# Patient Record
Sex: Female | Born: 1935 | Race: White | Hispanic: No | Smoking: Never smoker
Health system: Southern US, Community
[De-identification: ages and names within clinical notes are randomized; demographics above are authoritative.]

## PROBLEM LIST (undated history)

## (undated) DIAGNOSIS — N649 Disorder of breast, unspecified: Secondary | ICD-10-CM

## (undated) DIAGNOSIS — I10 Essential (primary) hypertension: Secondary | ICD-10-CM

## (undated) DIAGNOSIS — Z800 Family history of malignant neoplasm of digestive organs: Secondary | ICD-10-CM

## (undated) DIAGNOSIS — E7800 Pure hypercholesterolemia, unspecified: Secondary | ICD-10-CM

## (undated) DIAGNOSIS — Z853 Personal history of malignant neoplasm of breast: Secondary | ICD-10-CM

## (undated) DIAGNOSIS — E119 Type 2 diabetes mellitus without complications: Secondary | ICD-10-CM

## (undated) DIAGNOSIS — C50912 Malignant neoplasm of unspecified site of left female breast: Secondary | ICD-10-CM

## (undated) DIAGNOSIS — Z801 Family history of malignant neoplasm of trachea, bronchus and lung: Secondary | ICD-10-CM

## (undated) DIAGNOSIS — Z803 Family history of malignant neoplasm of breast: Secondary | ICD-10-CM

## (undated) HISTORY — DX: Disorder of breast, unspecified: N64.9

## (undated) HISTORY — DX: Pure hypercholesterolemia, unspecified: E78.00

## (undated) HISTORY — PX: MASTECTOMY: SHX3

## (undated) HISTORY — DX: Family history of malignant neoplasm of digestive organs: Z80.0

## (undated) HISTORY — PX: BACK SURGERY: SHX140

## (undated) HISTORY — DX: Family history of malignant neoplasm of trachea, bronchus and lung: Z80.1

## (undated) HISTORY — DX: Family history of malignant neoplasm of breast: Z80.3

## (undated) HISTORY — DX: Essential (primary) hypertension: I10

## (undated) HISTORY — DX: Malignant neoplasm of unspecified site of left female breast: C50.912

## (undated) HISTORY — DX: Personal history of malignant neoplasm of breast: Z85.3

## (undated) HISTORY — DX: Type 2 diabetes mellitus without complications: E11.9

---

## 2001-04-27 ENCOUNTER — Ambulatory Visit (HOSPITAL_COMMUNITY): Admission: RE | Admit: 2001-04-27 | Discharge: 2001-04-27 | Payer: Self-pay | Admitting: Internal Medicine

## 2001-04-27 ENCOUNTER — Encounter: Payer: Self-pay | Admitting: Internal Medicine

## 2001-05-24 ENCOUNTER — Other Ambulatory Visit: Admission: RE | Admit: 2001-05-24 | Discharge: 2001-05-24 | Payer: Self-pay | Admitting: Internal Medicine

## 2002-04-30 ENCOUNTER — Ambulatory Visit (HOSPITAL_COMMUNITY): Admission: RE | Admit: 2002-04-30 | Discharge: 2002-04-30 | Payer: Self-pay | Admitting: Internal Medicine

## 2002-04-30 ENCOUNTER — Encounter: Payer: Self-pay | Admitting: Internal Medicine

## 2003-05-05 ENCOUNTER — Ambulatory Visit (HOSPITAL_COMMUNITY): Admission: RE | Admit: 2003-05-05 | Discharge: 2003-05-05 | Payer: Self-pay | Admitting: Internal Medicine

## 2004-05-24 ENCOUNTER — Ambulatory Visit (HOSPITAL_COMMUNITY): Admission: RE | Admit: 2004-05-24 | Discharge: 2004-05-24 | Payer: Self-pay | Admitting: Internal Medicine

## 2004-06-01 ENCOUNTER — Ambulatory Visit (HOSPITAL_COMMUNITY): Admission: RE | Admit: 2004-06-01 | Discharge: 2004-06-01 | Payer: Self-pay | Admitting: Internal Medicine

## 2004-06-16 ENCOUNTER — Ambulatory Visit (HOSPITAL_COMMUNITY): Admission: RE | Admit: 2004-06-16 | Discharge: 2004-06-16 | Payer: Self-pay | Admitting: Internal Medicine

## 2004-07-07 ENCOUNTER — Ambulatory Visit: Payer: Self-pay | Admitting: Internal Medicine

## 2004-07-07 ENCOUNTER — Ambulatory Visit (HOSPITAL_COMMUNITY): Admission: RE | Admit: 2004-07-07 | Discharge: 2004-07-07 | Payer: Self-pay | Admitting: Internal Medicine

## 2004-08-02 ENCOUNTER — Emergency Department (HOSPITAL_COMMUNITY): Admission: EM | Admit: 2004-08-02 | Discharge: 2004-08-02 | Payer: Self-pay | Admitting: Emergency Medicine

## 2005-06-01 ENCOUNTER — Ambulatory Visit (HOSPITAL_COMMUNITY): Admission: RE | Admit: 2005-06-01 | Discharge: 2005-06-01 | Payer: Self-pay | Admitting: Internal Medicine

## 2005-08-16 ENCOUNTER — Ambulatory Visit (HOSPITAL_COMMUNITY): Admission: RE | Admit: 2005-08-16 | Discharge: 2005-08-16 | Payer: Self-pay | Admitting: Internal Medicine

## 2006-06-02 ENCOUNTER — Ambulatory Visit (HOSPITAL_COMMUNITY): Admission: RE | Admit: 2006-06-02 | Discharge: 2006-06-02 | Payer: Self-pay | Admitting: Internal Medicine

## 2007-06-21 ENCOUNTER — Ambulatory Visit (HOSPITAL_COMMUNITY): Admission: RE | Admit: 2007-06-21 | Discharge: 2007-06-21 | Payer: Self-pay | Admitting: Internal Medicine

## 2007-10-09 ENCOUNTER — Ambulatory Visit (HOSPITAL_COMMUNITY): Admission: RE | Admit: 2007-10-09 | Discharge: 2007-10-09 | Payer: Self-pay | Admitting: Internal Medicine

## 2008-07-01 ENCOUNTER — Ambulatory Visit (HOSPITAL_COMMUNITY): Admission: RE | Admit: 2008-07-01 | Discharge: 2008-07-01 | Payer: Self-pay | Admitting: Internal Medicine

## 2008-07-17 ENCOUNTER — Ambulatory Visit (HOSPITAL_COMMUNITY): Admission: RE | Admit: 2008-07-17 | Discharge: 2008-07-17 | Payer: Self-pay | Admitting: Internal Medicine

## 2008-07-30 ENCOUNTER — Ambulatory Visit (HOSPITAL_COMMUNITY): Admission: RE | Admit: 2008-07-30 | Discharge: 2008-07-30 | Payer: Self-pay | Admitting: Internal Medicine

## 2009-01-02 ENCOUNTER — Emergency Department (HOSPITAL_COMMUNITY): Admission: EM | Admit: 2009-01-02 | Discharge: 2009-01-02 | Payer: Self-pay | Admitting: Emergency Medicine

## 2009-02-04 ENCOUNTER — Ambulatory Visit (HOSPITAL_COMMUNITY): Admission: RE | Admit: 2009-02-04 | Discharge: 2009-02-04 | Payer: Self-pay | Admitting: Neurosurgery

## 2009-04-10 ENCOUNTER — Ambulatory Visit (HOSPITAL_COMMUNITY): Admission: RE | Admit: 2009-04-10 | Discharge: 2009-04-10 | Payer: Self-pay | Admitting: Internal Medicine

## 2009-06-17 ENCOUNTER — Inpatient Hospital Stay (HOSPITAL_COMMUNITY): Admission: RE | Admit: 2009-06-17 | Discharge: 2009-06-22 | Payer: Self-pay | Admitting: Neurosurgery

## 2009-06-22 ENCOUNTER — Inpatient Hospital Stay: Admission: AD | Admit: 2009-06-22 | Discharge: 2009-07-10 | Payer: Self-pay | Admitting: Internal Medicine

## 2009-08-11 ENCOUNTER — Ambulatory Visit (HOSPITAL_COMMUNITY): Admission: RE | Admit: 2009-08-11 | Discharge: 2009-08-11 | Payer: Self-pay | Admitting: Internal Medicine

## 2010-07-13 ENCOUNTER — Other Ambulatory Visit (HOSPITAL_COMMUNITY): Payer: Self-pay | Admitting: Internal Medicine

## 2010-07-13 DIAGNOSIS — Z139 Encounter for screening, unspecified: Secondary | ICD-10-CM

## 2010-08-13 ENCOUNTER — Ambulatory Visit (HOSPITAL_COMMUNITY)
Admission: RE | Admit: 2010-08-13 | Discharge: 2010-08-13 | Disposition: A | Discharge status: Home / Self Care | Payer: Medicare Other | Source: Ambulatory Visit | Attending: Internal Medicine | Admitting: Internal Medicine

## 2010-08-13 DIAGNOSIS — Z139 Encounter for screening, unspecified: Secondary | ICD-10-CM

## 2010-08-13 DIAGNOSIS — Z1231 Encounter for screening mammogram for malignant neoplasm of breast: Secondary | ICD-10-CM | POA: Insufficient documentation

## 2010-08-18 ENCOUNTER — Other Ambulatory Visit: Payer: Self-pay | Admitting: Internal Medicine

## 2010-08-18 DIAGNOSIS — R928 Other abnormal and inconclusive findings on diagnostic imaging of breast: Secondary | ICD-10-CM

## 2010-08-25 ENCOUNTER — Ambulatory Visit (HOSPITAL_COMMUNITY)
Admission: RE | Admit: 2010-08-25 | Discharge: 2010-08-25 | Disposition: A | Discharge status: Home / Self Care | Payer: Medicare Other | Source: Ambulatory Visit | Attending: Internal Medicine | Admitting: Internal Medicine

## 2010-08-25 DIAGNOSIS — R928 Other abnormal and inconclusive findings on diagnostic imaging of breast: Secondary | ICD-10-CM

## 2010-08-29 LAB — TYPE AND SCREEN
ABO/RH(D): B POS
Antibody Screen: POSITIVE

## 2010-09-13 LAB — TYPE AND SCREEN
ABO/RH(D): B POS
Antibody Screen: POSITIVE
DAT, IgG: NEGATIVE
Donor AG Type: NEGATIVE
Donor AG Type: NEGATIVE
PT AG Type: NEGATIVE

## 2010-09-13 LAB — BASIC METABOLIC PANEL
BUN: 19 mg/dL (ref 6–23)
CO2: 33 mEq/L — ABNORMAL HIGH (ref 19–32)
Calcium: 10.7 mg/dL — ABNORMAL HIGH (ref 8.4–10.5)
Chloride: 99 mEq/L (ref 96–112)
Creatinine, Ser: 0.72 mg/dL (ref 0.4–1.2)
GFR calc Af Amer: >60 mL/min (ref >60)
GFR calc non Af Amer: >60 mL/min (ref >60)
Glucose, Bld: 123 mg/dL — ABNORMAL HIGH (ref 70–99)
Potassium: 4.3 mEq/L (ref 3.5–5.1)
Sodium: 139 mEq/L (ref 135–145)

## 2010-09-13 LAB — CBC
HCT: 43 % (ref 36.0–46.0)
Hemoglobin: 14.5 g/dL (ref 12.0–15.0)
MCHC: 33.7 g/dL (ref 30.0–36.0)
MCV: 91.8 fL (ref 78.0–100.0)
Platelets: 328 10*3/uL (ref 150–400)
RBC: 4.68 MIL/uL (ref 3.87–5.11)
RDW: 14.2 % (ref 11.5–15.5)
WBC: 10.4 10*3/uL (ref 4.0–10.5)

## 2010-10-29 NOTE — Op Note (Signed)
Christine Sutton               ACCOUNT NO.:  [id number]   MEDICAL RECORD NO.:  [id number]          PATIENT TYPE:  AMB   LOCATION:  DAY                           FACILITY:  [facility]   PHYSICIAN:  Lionel December, M.D.    DATE OF BIRTH:  09-24-1929   DATE OF PROCEDURE:  07/07/2004  DATE OF DISCHARGE:                                 OPERATIVE REPORT   PROCEDURE:  Total colonoscopy.   INDICATIONS:  Christine Sutton is a 77 year old Caucasian female who is here for  screening colonoscopy.  Family history is positive for colon carcinoma in  her mother who developed it in her early 26's.  Procedure was reviewed with  the patient and informed consent was obtained.   PREOPERATIVE MEDICATIONS:  Demerol 25 mg IV, Versed 4 mg IV in divided  doses.   FINDINGS:  The procedure was performed in the endoscopy suite.  The  patient's vital signs and O2 saturation were monitored during the procedure  and remained stable.  The patient was placed in the left lateral recumbent  position.  Rectal examination was performed.  No abnormality noted on  external or digital exam.  Olympus video scope was placed in the rectum and  advanced under vision into the sigmoid colon and beyond.  Preparation was  satisfactory.  She had multiple diverticula at the sigmoid colon and a few  more in the rest of the colon.  The scope was passed to the cecum which was  identified by the appendiceal orifice and ileocecal valve. Pictures taken  for the record.  As the scope was withdrawn, colonic mucosa was carefully  examined and was normal throughout.  Rectal mucosa similarly was normal.  Scope was retroflexed to examine the anorectal junction which was  unremarkable.  Endoscope was straightened and withdrawn.  The patient  tolerated the procedure well.   FINAL DIAGNOSIS:  1.  Pancolonic diverticulosis.  However, most of the diverticula was located      at the sigmoid colon.  2.  No evidence of colonic polyp or neoplasm.   RECOMMENDATIONS:  1.  High fiber diet.  2.  Citrucel one tablespoonful daily.  3.  She should continue yearly hemoccults.  4.  Consider next screening 10 years from now.   RECOMMENDATIONS:  I will contact the patient with biopsy results.  Given his  family history it would be a good idea for him to come back for another  colonoscopy in five years from now.   Atropine  below the dentate line along with a prominent papilla.  the scope was straightened and withdrawn.      NR/MEDQ  D:  07/07/2004  T:  07/07/2004  Job:  [id number]   cc:   Bernerd Limbo. Leona Carry, M.D.  [address]  [place]  [place] [address]  Fax: [phone]

## 2011-01-18 ENCOUNTER — Other Ambulatory Visit (HOSPITAL_COMMUNITY): Payer: Self-pay | Admitting: Internal Medicine

## 2011-01-18 DIAGNOSIS — Z09 Encounter for follow-up examination after completed treatment for conditions other than malignant neoplasm: Secondary | ICD-10-CM

## 2011-03-02 ENCOUNTER — Other Ambulatory Visit (HOSPITAL_COMMUNITY): Payer: Self-pay | Admitting: Internal Medicine

## 2011-03-02 ENCOUNTER — Ambulatory Visit (HOSPITAL_COMMUNITY)
Admission: RE | Admit: 2011-03-02 | Discharge: 2011-03-02 | Disposition: A | Discharge status: Home / Self Care | Payer: Medicare Other | Source: Ambulatory Visit | Attending: Internal Medicine | Admitting: Internal Medicine

## 2011-03-02 ENCOUNTER — Other Ambulatory Visit: Payer: Self-pay | Admitting: Internal Medicine

## 2011-03-02 DIAGNOSIS — Z09 Encounter for follow-up examination after completed treatment for conditions other than malignant neoplasm: Secondary | ICD-10-CM

## 2011-03-02 DIAGNOSIS — R928 Other abnormal and inconclusive findings on diagnostic imaging of breast: Secondary | ICD-10-CM | POA: Insufficient documentation

## 2011-03-02 DIAGNOSIS — R921 Mammographic calcification found on diagnostic imaging of breast: Secondary | ICD-10-CM

## 2011-03-02 DIAGNOSIS — R920 Mammographic microcalcification found on diagnostic imaging of breast: Secondary | ICD-10-CM | POA: Insufficient documentation

## 2011-03-02 DIAGNOSIS — Z803 Family history of malignant neoplasm of breast: Secondary | ICD-10-CM | POA: Insufficient documentation

## 2011-03-08 ENCOUNTER — Ambulatory Visit
Admission: RE | Admit: 2011-03-08 | Discharge: 2011-03-08 | Disposition: A | Discharge status: Home / Self Care | Payer: Medicare Other | Source: Ambulatory Visit | Attending: Internal Medicine | Admitting: Internal Medicine

## 2011-03-08 DIAGNOSIS — R921 Mammographic calcification found on diagnostic imaging of breast: Secondary | ICD-10-CM

## 2011-03-09 ENCOUNTER — Other Ambulatory Visit: Payer: Self-pay | Admitting: Internal Medicine

## 2011-03-09 DIAGNOSIS — C50919 Malignant neoplasm of unspecified site of unspecified female breast: Secondary | ICD-10-CM

## 2011-03-16 ENCOUNTER — Other Ambulatory Visit: Payer: Self-pay | Admitting: Internal Medicine

## 2011-03-16 ENCOUNTER — Ambulatory Visit
Admission: RE | Admit: 2011-03-16 | Discharge: 2011-03-16 | Disposition: A | Discharge status: Home / Self Care | Payer: Medicare Other | Source: Ambulatory Visit | Attending: Internal Medicine | Admitting: Internal Medicine

## 2011-03-16 DIAGNOSIS — C50919 Malignant neoplasm of unspecified site of unspecified female breast: Secondary | ICD-10-CM

## 2011-03-16 MED ORDER — GADOBENATE DIMEGLUMINE 529 MG/ML IV SOLN
14.0000 mL | Freq: Once | INTRAVENOUS | Status: AC | PRN
  Administered 2011-03-16: 14 mL via INTRAVENOUS

## 2011-03-21 ENCOUNTER — Other Ambulatory Visit: Payer: Self-pay | Admitting: *Deleted

## 2011-03-21 ENCOUNTER — Other Ambulatory Visit: Payer: Self-pay | Admitting: Internal Medicine

## 2011-03-21 DIAGNOSIS — R928 Other abnormal and inconclusive findings on diagnostic imaging of breast: Secondary | ICD-10-CM

## 2011-06-22 DIAGNOSIS — E119 Type 2 diabetes mellitus without complications: Secondary | ICD-10-CM | POA: Diagnosis not present

## 2011-06-30 ENCOUNTER — Other Ambulatory Visit (HOSPITAL_COMMUNITY): Payer: Self-pay | Admitting: Internal Medicine

## 2011-06-30 DIAGNOSIS — R413 Other amnesia: Secondary | ICD-10-CM | POA: Diagnosis not present

## 2011-06-30 DIAGNOSIS — E119 Type 2 diabetes mellitus without complications: Secondary | ICD-10-CM | POA: Diagnosis not present

## 2011-07-04 ENCOUNTER — Ambulatory Visit (HOSPITAL_COMMUNITY)
Admission: RE | Admit: 2011-07-04 | Discharge: 2011-07-04 | Disposition: A | Discharge status: Home / Self Care | Payer: Medicare Other | Source: Ambulatory Visit | Attending: Internal Medicine | Admitting: Internal Medicine

## 2011-07-04 DIAGNOSIS — I10 Essential (primary) hypertension: Secondary | ICD-10-CM | POA: Insufficient documentation

## 2011-07-04 DIAGNOSIS — Z853 Personal history of malignant neoplasm of breast: Secondary | ICD-10-CM | POA: Insufficient documentation

## 2011-07-04 DIAGNOSIS — R413 Other amnesia: Secondary | ICD-10-CM | POA: Insufficient documentation

## 2011-07-04 DIAGNOSIS — E119 Type 2 diabetes mellitus without complications: Secondary | ICD-10-CM | POA: Insufficient documentation

## 2011-07-04 DIAGNOSIS — C50919 Malignant neoplasm of unspecified site of unspecified female breast: Secondary | ICD-10-CM | POA: Diagnosis not present

## 2011-09-27 DIAGNOSIS — H2510 Age-related nuclear cataract, unspecified eye: Secondary | ICD-10-CM | POA: Diagnosis not present

## 2011-09-27 DIAGNOSIS — IMO0002 Reserved for concepts with insufficient information to code with codable children: Secondary | ICD-10-CM | POA: Diagnosis not present

## 2011-09-30 DIAGNOSIS — N390 Urinary tract infection, site not specified: Secondary | ICD-10-CM | POA: Diagnosis not present

## 2011-10-28 DIAGNOSIS — I10 Essential (primary) hypertension: Secondary | ICD-10-CM | POA: Diagnosis not present

## 2011-10-28 DIAGNOSIS — E785 Hyperlipidemia, unspecified: Secondary | ICD-10-CM | POA: Diagnosis not present

## 2011-10-28 DIAGNOSIS — E119 Type 2 diabetes mellitus without complications: Secondary | ICD-10-CM | POA: Diagnosis not present

## 2011-10-28 DIAGNOSIS — C50919 Malignant neoplasm of unspecified site of unspecified female breast: Secondary | ICD-10-CM | POA: Diagnosis not present

## 2011-10-28 DIAGNOSIS — Z79899 Other long term (current) drug therapy: Secondary | ICD-10-CM | POA: Diagnosis not present

## 2011-11-04 DIAGNOSIS — E119 Type 2 diabetes mellitus without complications: Secondary | ICD-10-CM | POA: Diagnosis not present

## 2011-11-04 DIAGNOSIS — Z1212 Encounter for screening for malignant neoplasm of rectum: Secondary | ICD-10-CM | POA: Diagnosis not present

## 2011-11-04 DIAGNOSIS — N390 Urinary tract infection, site not specified: Secondary | ICD-10-CM | POA: Diagnosis not present

## 2011-11-04 DIAGNOSIS — C50919 Malignant neoplasm of unspecified site of unspecified female breast: Secondary | ICD-10-CM | POA: Diagnosis not present

## 2011-11-04 DIAGNOSIS — E785 Hyperlipidemia, unspecified: Secondary | ICD-10-CM | POA: Diagnosis not present

## 2011-11-04 DIAGNOSIS — I10 Essential (primary) hypertension: Secondary | ICD-10-CM | POA: Diagnosis not present

## 2011-11-22 ENCOUNTER — Encounter: Payer: Medicare Other | Admitting: Internal Medicine

## 2011-11-23 ENCOUNTER — Encounter: Payer: Medicare Other | Admitting: Internal Medicine

## 2011-11-23 DIAGNOSIS — C50919 Malignant neoplasm of unspecified site of unspecified female breast: Secondary | ICD-10-CM | POA: Diagnosis not present

## 2011-11-23 DIAGNOSIS — Z170 Estrogen receptor positive status [ER+]: Secondary | ICD-10-CM | POA: Diagnosis not present

## 2012-01-06 ENCOUNTER — Ambulatory Visit (INDEPENDENT_AMBULATORY_CARE_PROVIDER_SITE_OTHER): Payer: Medicare Other | Admitting: Urology

## 2012-01-06 DIAGNOSIS — N3946 Mixed incontinence: Secondary | ICD-10-CM | POA: Diagnosis not present

## 2012-01-06 DIAGNOSIS — N952 Postmenopausal atrophic vaginitis: Secondary | ICD-10-CM | POA: Diagnosis not present

## 2012-01-06 DIAGNOSIS — R300 Dysuria: Secondary | ICD-10-CM | POA: Diagnosis not present

## 2012-01-06 DIAGNOSIS — R82998 Other abnormal findings in urine: Secondary | ICD-10-CM

## 2012-01-23 ENCOUNTER — Other Ambulatory Visit (HOSPITAL_COMMUNITY): Payer: Self-pay | Admitting: Internal Medicine

## 2012-01-23 DIAGNOSIS — Z139 Encounter for screening, unspecified: Secondary | ICD-10-CM

## 2012-02-29 DIAGNOSIS — R5381 Other malaise: Secondary | ICD-10-CM | POA: Diagnosis not present

## 2012-02-29 DIAGNOSIS — E119 Type 2 diabetes mellitus without complications: Secondary | ICD-10-CM | POA: Diagnosis not present

## 2012-02-29 DIAGNOSIS — Z79899 Other long term (current) drug therapy: Secondary | ICD-10-CM | POA: Diagnosis not present

## 2012-02-29 DIAGNOSIS — R5383 Other fatigue: Secondary | ICD-10-CM | POA: Diagnosis not present

## 2012-02-29 DIAGNOSIS — E039 Hypothyroidism, unspecified: Secondary | ICD-10-CM | POA: Diagnosis not present

## 2012-03-05 ENCOUNTER — Ambulatory Visit (HOSPITAL_COMMUNITY)
Admission: RE | Admit: 2012-03-05 | Discharge: 2012-03-05 | Disposition: A | Discharge status: Home / Self Care | Payer: Medicare Other | Source: Ambulatory Visit | Attending: Internal Medicine | Admitting: Internal Medicine

## 2012-03-05 DIAGNOSIS — Z1231 Encounter for screening mammogram for malignant neoplasm of breast: Secondary | ICD-10-CM | POA: Insufficient documentation

## 2012-03-05 DIAGNOSIS — Z139 Encounter for screening, unspecified: Secondary | ICD-10-CM

## 2012-03-07 DIAGNOSIS — E119 Type 2 diabetes mellitus without complications: Secondary | ICD-10-CM | POA: Diagnosis not present

## 2012-03-07 DIAGNOSIS — R413 Other amnesia: Secondary | ICD-10-CM | POA: Diagnosis not present

## 2012-03-07 DIAGNOSIS — I10 Essential (primary) hypertension: Secondary | ICD-10-CM | POA: Diagnosis not present

## 2012-04-06 ENCOUNTER — Ambulatory Visit: Payer: Medicare Other | Admitting: Urology

## 2012-04-06 ENCOUNTER — Ambulatory Visit (INDEPENDENT_AMBULATORY_CARE_PROVIDER_SITE_OTHER): Payer: Medicare Other | Admitting: Urology

## 2012-04-06 DIAGNOSIS — Z23 Encounter for immunization: Secondary | ICD-10-CM | POA: Diagnosis not present

## 2012-04-06 DIAGNOSIS — N3946 Mixed incontinence: Secondary | ICD-10-CM

## 2012-04-06 DIAGNOSIS — N952 Postmenopausal atrophic vaginitis: Secondary | ICD-10-CM

## 2012-04-06 DIAGNOSIS — R82998 Other abnormal findings in urine: Secondary | ICD-10-CM | POA: Diagnosis not present

## 2012-05-02 DIAGNOSIS — L820 Inflamed seborrheic keratosis: Secondary | ICD-10-CM | POA: Diagnosis not present

## 2012-05-02 DIAGNOSIS — L821 Other seborrheic keratosis: Secondary | ICD-10-CM | POA: Diagnosis not present

## 2012-05-02 DIAGNOSIS — D2360 Other benign neoplasm of skin of unspecified upper limb, including shoulder: Secondary | ICD-10-CM | POA: Diagnosis not present

## 2012-05-02 DIAGNOSIS — D235 Other benign neoplasm of skin of trunk: Secondary | ICD-10-CM | POA: Diagnosis not present

## 2012-05-28 ENCOUNTER — Encounter: Payer: Medicare Other | Admitting: Internal Medicine

## 2012-05-28 DIAGNOSIS — C50919 Malignant neoplasm of unspecified site of unspecified female breast: Secondary | ICD-10-CM | POA: Diagnosis not present

## 2012-05-28 DIAGNOSIS — Z170 Estrogen receptor positive status [ER+]: Secondary | ICD-10-CM | POA: Diagnosis not present

## 2012-07-04 DIAGNOSIS — E119 Type 2 diabetes mellitus without complications: Secondary | ICD-10-CM | POA: Diagnosis not present

## 2012-07-12 DIAGNOSIS — E119 Type 2 diabetes mellitus without complications: Secondary | ICD-10-CM | POA: Diagnosis not present

## 2012-07-12 DIAGNOSIS — F411 Generalized anxiety disorder: Secondary | ICD-10-CM | POA: Diagnosis not present

## 2012-07-12 DIAGNOSIS — I10 Essential (primary) hypertension: Secondary | ICD-10-CM | POA: Diagnosis not present

## 2012-10-16 DIAGNOSIS — IMO0002 Reserved for concepts with insufficient information to code with codable children: Secondary | ICD-10-CM | POA: Diagnosis not present

## 2012-10-16 DIAGNOSIS — H2510 Age-related nuclear cataract, unspecified eye: Secondary | ICD-10-CM | POA: Diagnosis not present

## 2012-11-02 DIAGNOSIS — M546 Pain in thoracic spine: Secondary | ICD-10-CM | POA: Diagnosis not present

## 2012-11-02 DIAGNOSIS — M545 Low back pain, unspecified: Secondary | ICD-10-CM | POA: Diagnosis not present

## 2012-11-02 DIAGNOSIS — M4120 Other idiopathic scoliosis, site unspecified: Secondary | ICD-10-CM | POA: Diagnosis not present

## 2012-11-02 DIAGNOSIS — M47817 Spondylosis without myelopathy or radiculopathy, lumbosacral region: Secondary | ICD-10-CM | POA: Diagnosis not present

## 2012-11-08 DIAGNOSIS — C50919 Malignant neoplasm of unspecified site of unspecified female breast: Secondary | ICD-10-CM | POA: Diagnosis not present

## 2012-11-08 DIAGNOSIS — Z79899 Other long term (current) drug therapy: Secondary | ICD-10-CM | POA: Diagnosis not present

## 2012-11-08 DIAGNOSIS — E119 Type 2 diabetes mellitus without complications: Secondary | ICD-10-CM | POA: Diagnosis not present

## 2012-11-08 DIAGNOSIS — E785 Hyperlipidemia, unspecified: Secondary | ICD-10-CM | POA: Diagnosis not present

## 2012-11-08 DIAGNOSIS — I10 Essential (primary) hypertension: Secondary | ICD-10-CM | POA: Diagnosis not present

## 2012-11-12 DIAGNOSIS — Z79899 Other long term (current) drug therapy: Secondary | ICD-10-CM | POA: Diagnosis not present

## 2012-11-12 DIAGNOSIS — I10 Essential (primary) hypertension: Secondary | ICD-10-CM | POA: Diagnosis not present

## 2012-11-12 DIAGNOSIS — E785 Hyperlipidemia, unspecified: Secondary | ICD-10-CM | POA: Diagnosis not present

## 2012-11-12 DIAGNOSIS — C50919 Malignant neoplasm of unspecified site of unspecified female breast: Secondary | ICD-10-CM | POA: Diagnosis not present

## 2012-11-12 DIAGNOSIS — E119 Type 2 diabetes mellitus without complications: Secondary | ICD-10-CM | POA: Diagnosis not present

## 2012-11-15 ENCOUNTER — Other Ambulatory Visit (HOSPITAL_COMMUNITY): Payer: Self-pay | Admitting: Neurosurgery

## 2012-11-15 DIAGNOSIS — M545 Low back pain, unspecified: Secondary | ICD-10-CM

## 2012-11-20 DIAGNOSIS — I10 Essential (primary) hypertension: Secondary | ICD-10-CM | POA: Diagnosis not present

## 2012-11-20 DIAGNOSIS — E119 Type 2 diabetes mellitus without complications: Secondary | ICD-10-CM | POA: Diagnosis not present

## 2012-11-20 DIAGNOSIS — Z1212 Encounter for screening for malignant neoplasm of rectum: Secondary | ICD-10-CM | POA: Diagnosis not present

## 2012-11-20 DIAGNOSIS — C50919 Malignant neoplasm of unspecified site of unspecified female breast: Secondary | ICD-10-CM | POA: Diagnosis not present

## 2012-11-20 DIAGNOSIS — N390 Urinary tract infection, site not specified: Secondary | ICD-10-CM | POA: Diagnosis not present

## 2012-11-20 DIAGNOSIS — E785 Hyperlipidemia, unspecified: Secondary | ICD-10-CM | POA: Diagnosis not present

## 2012-11-26 ENCOUNTER — Encounter: Payer: Medicare Other | Admitting: Internal Medicine

## 2012-11-26 DIAGNOSIS — C50919 Malignant neoplasm of unspecified site of unspecified female breast: Secondary | ICD-10-CM | POA: Diagnosis not present

## 2012-11-26 DIAGNOSIS — Z170 Estrogen receptor positive status [ER+]: Secondary | ICD-10-CM | POA: Diagnosis not present

## 2012-11-26 DIAGNOSIS — Z9010 Acquired absence of unspecified breast and nipple: Secondary | ICD-10-CM | POA: Diagnosis not present

## 2012-11-27 ENCOUNTER — Ambulatory Visit (HOSPITAL_COMMUNITY)
Admission: RE | Admit: 2012-11-27 | Discharge: 2012-11-27 | Disposition: A | Discharge status: Home / Self Care | Payer: Medicare Other | Source: Ambulatory Visit | Attending: Neurosurgery | Admitting: Neurosurgery

## 2012-11-27 ENCOUNTER — Ambulatory Visit (HOSPITAL_COMMUNITY): Payer: Medicare Other

## 2012-11-27 DIAGNOSIS — M79609 Pain in unspecified limb: Secondary | ICD-10-CM | POA: Insufficient documentation

## 2012-11-27 DIAGNOSIS — M545 Low back pain, unspecified: Secondary | ICD-10-CM | POA: Diagnosis not present

## 2012-11-27 DIAGNOSIS — M47817 Spondylosis without myelopathy or radiculopathy, lumbosacral region: Secondary | ICD-10-CM | POA: Insufficient documentation

## 2012-11-27 DIAGNOSIS — M48061 Spinal stenosis, lumbar region without neurogenic claudication: Secondary | ICD-10-CM | POA: Diagnosis not present

## 2012-11-27 MED ORDER — GADOBENATE DIMEGLUMINE 529 MG/ML IV SOLN
14.0000 mL | Freq: Once | INTRAVENOUS | Status: AC | PRN
  Administered 2012-11-27: 14 mL via INTRAVENOUS

## 2012-12-07 DIAGNOSIS — IMO0002 Reserved for concepts with insufficient information to code with codable children: Secondary | ICD-10-CM | POA: Diagnosis not present

## 2012-12-07 DIAGNOSIS — M4120 Other idiopathic scoliosis, site unspecified: Secondary | ICD-10-CM | POA: Diagnosis not present

## 2012-12-07 DIAGNOSIS — M48061 Spinal stenosis, lumbar region without neurogenic claudication: Secondary | ICD-10-CM | POA: Diagnosis not present

## 2012-12-07 DIAGNOSIS — M47817 Spondylosis without myelopathy or radiculopathy, lumbosacral region: Secondary | ICD-10-CM | POA: Diagnosis not present

## 2013-01-25 DIAGNOSIS — M4120 Other idiopathic scoliosis, site unspecified: Secondary | ICD-10-CM | POA: Diagnosis not present

## 2013-01-25 DIAGNOSIS — M48061 Spinal stenosis, lumbar region without neurogenic claudication: Secondary | ICD-10-CM | POA: Diagnosis not present

## 2013-01-25 DIAGNOSIS — IMO0002 Reserved for concepts with insufficient information to code with codable children: Secondary | ICD-10-CM | POA: Diagnosis not present

## 2013-01-25 DIAGNOSIS — M47817 Spondylosis without myelopathy or radiculopathy, lumbosacral region: Secondary | ICD-10-CM | POA: Diagnosis not present

## 2013-01-29 ENCOUNTER — Other Ambulatory Visit (HOSPITAL_COMMUNITY): Payer: Self-pay | Admitting: Internal Medicine

## 2013-01-29 DIAGNOSIS — Z139 Encounter for screening, unspecified: Secondary | ICD-10-CM

## 2013-02-13 DIAGNOSIS — M48061 Spinal stenosis, lumbar region without neurogenic claudication: Secondary | ICD-10-CM | POA: Diagnosis not present

## 2013-02-13 DIAGNOSIS — M5137 Other intervertebral disc degeneration, lumbosacral region: Secondary | ICD-10-CM | POA: Diagnosis not present

## 2013-02-13 DIAGNOSIS — M47817 Spondylosis without myelopathy or radiculopathy, lumbosacral region: Secondary | ICD-10-CM | POA: Diagnosis not present

## 2013-02-13 DIAGNOSIS — IMO0002 Reserved for concepts with insufficient information to code with codable children: Secondary | ICD-10-CM | POA: Diagnosis not present

## 2013-03-11 ENCOUNTER — Ambulatory Visit (HOSPITAL_COMMUNITY)
Admission: RE | Admit: 2013-03-11 | Discharge: 2013-03-11 | Disposition: A | Discharge status: Home / Self Care | Payer: Medicare Other | Source: Ambulatory Visit | Attending: Internal Medicine | Admitting: Internal Medicine

## 2013-03-11 DIAGNOSIS — Z139 Encounter for screening, unspecified: Secondary | ICD-10-CM

## 2013-03-11 DIAGNOSIS — Z1231 Encounter for screening mammogram for malignant neoplasm of breast: Secondary | ICD-10-CM | POA: Diagnosis not present

## 2013-03-15 DIAGNOSIS — M4120 Other idiopathic scoliosis, site unspecified: Secondary | ICD-10-CM | POA: Diagnosis not present

## 2013-03-15 DIAGNOSIS — M5137 Other intervertebral disc degeneration, lumbosacral region: Secondary | ICD-10-CM | POA: Diagnosis not present

## 2013-03-15 DIAGNOSIS — M47817 Spondylosis without myelopathy or radiculopathy, lumbosacral region: Secondary | ICD-10-CM | POA: Diagnosis not present

## 2013-03-15 DIAGNOSIS — M48061 Spinal stenosis, lumbar region without neurogenic claudication: Secondary | ICD-10-CM | POA: Diagnosis not present

## 2013-03-22 DIAGNOSIS — Z23 Encounter for immunization: Secondary | ICD-10-CM | POA: Diagnosis not present

## 2013-03-27 DIAGNOSIS — E119 Type 2 diabetes mellitus without complications: Secondary | ICD-10-CM | POA: Diagnosis not present

## 2013-03-28 ENCOUNTER — Other Ambulatory Visit (HOSPITAL_COMMUNITY): Payer: Self-pay | Admitting: Neurosurgery

## 2013-03-28 DIAGNOSIS — IMO0002 Reserved for concepts with insufficient information to code with codable children: Secondary | ICD-10-CM

## 2013-04-02 ENCOUNTER — Other Ambulatory Visit: Payer: Self-pay | Admitting: Neurosurgery

## 2013-04-02 DIAGNOSIS — M549 Dorsalgia, unspecified: Secondary | ICD-10-CM

## 2013-04-04 ENCOUNTER — Ambulatory Visit
Admission: RE | Admit: 2013-04-04 | Discharge: 2013-04-04 | Disposition: A | Discharge status: Home / Self Care | Payer: Medicare Other | Source: Ambulatory Visit | Attending: Neurosurgery | Admitting: Neurosurgery

## 2013-04-04 VITALS — BP 125/67 | HR 76

## 2013-04-04 DIAGNOSIS — M545 Low back pain, unspecified: Secondary | ICD-10-CM | POA: Diagnosis not present

## 2013-04-04 DIAGNOSIS — M5137 Other intervertebral disc degeneration, lumbosacral region: Secondary | ICD-10-CM | POA: Diagnosis not present

## 2013-04-04 DIAGNOSIS — M47817 Spondylosis without myelopathy or radiculopathy, lumbosacral region: Secondary | ICD-10-CM | POA: Diagnosis not present

## 2013-04-04 DIAGNOSIS — I10 Essential (primary) hypertension: Secondary | ICD-10-CM | POA: Diagnosis not present

## 2013-04-04 DIAGNOSIS — E119 Type 2 diabetes mellitus without complications: Secondary | ICD-10-CM | POA: Diagnosis not present

## 2013-04-04 DIAGNOSIS — M4120 Other idiopathic scoliosis, site unspecified: Secondary | ICD-10-CM | POA: Diagnosis not present

## 2013-04-04 DIAGNOSIS — M549 Dorsalgia, unspecified: Secondary | ICD-10-CM

## 2013-04-04 MED ORDER — IOHEXOL 180 MG/ML  SOLN
15.0000 mL | Freq: Once | INTRAMUSCULAR | Status: AC | PRN
  Administered 2013-04-04: 15 mL via INTRATHECAL

## 2013-04-04 MED ORDER — DIAZEPAM 5 MG PO TABS
5.0000 mg | ORAL_TABLET | Freq: Once | ORAL | Status: AC
  Administered 2013-04-04: 5 mg via ORAL

## 2013-04-05 DIAGNOSIS — M4120 Other idiopathic scoliosis, site unspecified: Secondary | ICD-10-CM | POA: Diagnosis not present

## 2013-04-05 DIAGNOSIS — M47817 Spondylosis without myelopathy or radiculopathy, lumbosacral region: Secondary | ICD-10-CM | POA: Diagnosis not present

## 2013-04-05 DIAGNOSIS — IMO0002 Reserved for concepts with insufficient information to code with codable children: Secondary | ICD-10-CM | POA: Diagnosis not present

## 2013-04-05 DIAGNOSIS — M5137 Other intervertebral disc degeneration, lumbosacral region: Secondary | ICD-10-CM | POA: Diagnosis not present

## 2013-04-18 DIAGNOSIS — IMO0002 Reserved for concepts with insufficient information to code with codable children: Secondary | ICD-10-CM | POA: Diagnosis not present

## 2013-04-18 DIAGNOSIS — M47817 Spondylosis without myelopathy or radiculopathy, lumbosacral region: Secondary | ICD-10-CM | POA: Diagnosis not present

## 2013-04-18 DIAGNOSIS — Z981 Arthrodesis status: Secondary | ICD-10-CM | POA: Diagnosis not present

## 2013-06-21 DIAGNOSIS — M5137 Other intervertebral disc degeneration, lumbosacral region: Secondary | ICD-10-CM | POA: Diagnosis not present

## 2013-06-21 DIAGNOSIS — M4120 Other idiopathic scoliosis, site unspecified: Secondary | ICD-10-CM | POA: Diagnosis not present

## 2013-06-21 DIAGNOSIS — M47817 Spondylosis without myelopathy or radiculopathy, lumbosacral region: Secondary | ICD-10-CM | POA: Diagnosis not present

## 2013-06-21 DIAGNOSIS — I10 Essential (primary) hypertension: Secondary | ICD-10-CM | POA: Diagnosis not present

## 2013-07-11 DIAGNOSIS — IMO0002 Reserved for concepts with insufficient information to code with codable children: Secondary | ICD-10-CM | POA: Diagnosis not present

## 2013-07-11 DIAGNOSIS — M5137 Other intervertebral disc degeneration, lumbosacral region: Secondary | ICD-10-CM | POA: Diagnosis not present

## 2013-07-11 DIAGNOSIS — M47817 Spondylosis without myelopathy or radiculopathy, lumbosacral region: Secondary | ICD-10-CM | POA: Diagnosis not present

## 2013-08-26 DIAGNOSIS — C50919 Malignant neoplasm of unspecified site of unspecified female breast: Secondary | ICD-10-CM | POA: Diagnosis not present

## 2013-08-27 ENCOUNTER — Other Ambulatory Visit (HOSPITAL_COMMUNITY): Payer: Self-pay | Admitting: Internal Medicine

## 2013-08-27 DIAGNOSIS — Z1231 Encounter for screening mammogram for malignant neoplasm of breast: Secondary | ICD-10-CM

## 2013-09-20 DIAGNOSIS — M47817 Spondylosis without myelopathy or radiculopathy, lumbosacral region: Secondary | ICD-10-CM | POA: Diagnosis not present

## 2013-09-20 DIAGNOSIS — M545 Low back pain, unspecified: Secondary | ICD-10-CM | POA: Diagnosis not present

## 2013-09-20 DIAGNOSIS — IMO0002 Reserved for concepts with insufficient information to code with codable children: Secondary | ICD-10-CM | POA: Diagnosis not present

## 2013-09-20 DIAGNOSIS — M4120 Other idiopathic scoliosis, site unspecified: Secondary | ICD-10-CM | POA: Diagnosis not present

## 2013-10-22 DIAGNOSIS — H2510 Age-related nuclear cataract, unspecified eye: Secondary | ICD-10-CM | POA: Diagnosis not present

## 2013-10-22 DIAGNOSIS — IMO0002 Reserved for concepts with insufficient information to code with codable children: Secondary | ICD-10-CM | POA: Diagnosis not present

## 2013-10-22 DIAGNOSIS — H04129 Dry eye syndrome of unspecified lacrimal gland: Secondary | ICD-10-CM | POA: Diagnosis not present

## 2013-11-18 DIAGNOSIS — E785 Hyperlipidemia, unspecified: Secondary | ICD-10-CM | POA: Diagnosis not present

## 2013-11-18 DIAGNOSIS — I10 Essential (primary) hypertension: Secondary | ICD-10-CM | POA: Diagnosis not present

## 2013-11-18 DIAGNOSIS — Z79899 Other long term (current) drug therapy: Secondary | ICD-10-CM | POA: Diagnosis not present

## 2013-11-18 DIAGNOSIS — C50919 Malignant neoplasm of unspecified site of unspecified female breast: Secondary | ICD-10-CM | POA: Diagnosis not present

## 2013-11-18 DIAGNOSIS — E119 Type 2 diabetes mellitus without complications: Secondary | ICD-10-CM | POA: Diagnosis not present

## 2013-11-25 DIAGNOSIS — Z0000 Encounter for general adult medical examination without abnormal findings: Secondary | ICD-10-CM | POA: Diagnosis not present

## 2013-12-09 DIAGNOSIS — H6120 Impacted cerumen, unspecified ear: Secondary | ICD-10-CM | POA: Diagnosis not present

## 2013-12-10 ENCOUNTER — Ambulatory Visit (HOSPITAL_COMMUNITY): Payer: Medicare Other

## 2013-12-11 NOTE — Progress Notes (Signed)
This encounter was created in error - please disregard.

## 2013-12-24 ENCOUNTER — Encounter (HOSPITAL_COMMUNITY): Payer: Self-pay

## 2013-12-24 ENCOUNTER — Encounter (HOSPITAL_COMMUNITY): Payer: Medicare Other | Attending: Hematology and Oncology

## 2013-12-24 VITALS — BP 144/66 | HR 79 | Temp 98.3°F | Resp 16 | Ht 65.5 in | Wt 141.7 lb

## 2013-12-24 DIAGNOSIS — E119 Type 2 diabetes mellitus without complications: Secondary | ICD-10-CM

## 2013-12-24 DIAGNOSIS — I10 Essential (primary) hypertension: Secondary | ICD-10-CM | POA: Insufficient documentation

## 2013-12-24 DIAGNOSIS — Z853 Personal history of malignant neoplasm of breast: Secondary | ICD-10-CM | POA: Diagnosis not present

## 2013-12-24 DIAGNOSIS — E089 Diabetes mellitus due to underlying condition without complications: Secondary | ICD-10-CM

## 2013-12-24 DIAGNOSIS — C50919 Malignant neoplasm of unspecified site of unspecified female breast: Secondary | ICD-10-CM

## 2013-12-24 DIAGNOSIS — Z9010 Acquired absence of unspecified breast and nipple: Secondary | ICD-10-CM

## 2013-12-24 DIAGNOSIS — M810 Age-related osteoporosis without current pathological fracture: Secondary | ICD-10-CM

## 2013-12-24 MED ORDER — RALOXIFENE HCL 60 MG PO TABS: 60.0000 mg | ORAL_TABLET | Freq: Every day | ORAL | Status: DC

## 2013-12-24 NOTE — Patient Instructions (Signed)
[facility] Discharge Instructions  RECOMMENDATIONS MADE BY THE CONSULTANT AND ANY TEST RESULTS WILL BE SENT TO YOUR REFERRING PHYSICIAN.  EXAM FINDINGS BY THE PHYSICIAN TODAY AND SIGNS OR SYMPTOMS TO REPORT TO CLINIC OR PRIMARY PHYSICIAN: Exam and findings as discussed by Dr. Barnet Glasgow.  He recommends treatment with either Evista, Tamoxifen or Anastrazole.  For you he recommends Evista which will also help with your osteoporosis.  MEDICATIONS PRESCRIBED:  Evista - take as directed.  INSTRUCTIONS/FOLLOW-UP: We will see you back in 1 month to make sure you are doing ok on the evista.  Thank you for choosing [facility] to provide your oncology and hematology care.  To afford each patient quality time with our providers, please arrive at least 15 minutes before your scheduled appointment time.  With your help, our goal is to use those 15 minutes to complete the necessary work-up to ensure our physicians have the information they need to help with your evaluation and healthcare recommendations.    Effective January 1st, 2014, we ask that you re-schedule your appointment with our physicians should you arrive 10 or more minutes late for your appointment.  We strive to give you quality time with our providers, and arriving late affects you and other patients whose appointments are after yours.    Again, thank you for choosing [facility].  Our hope is that these requests will decrease the amount of time that you wait before being seen by our physicians.       _____________________________________________________________  Should you have questions after your visit to [facility], please contact our office at [phone] between the hours of 8:30 a.m. and 4:30 p.m.  Voicemails left after 4:30 p.m. will not be returned until the following business day.  For prescription refill requests, have your pharmacy contact our office with your  prescription refill request.    _______________________________________________________________  We hope that we have given you very good care.  You may receive a patient satisfaction survey in the mail, please complete it and return it as soon as possible.  We value your feedback!  _______________________________________________________________  Have you asked about our STAR program?  STAR stands for Survivorship Training and Rehabilitation, and this is a nationally recognized cancer care program that focuses on survivorship and rehabilitation.  Cancer and cancer treatments may cause problems, such as, pain, making you feel tired and keeping you from doing the things that you need or want to do. Cancer rehabilitation can help. Our goal is to reduce these troubling effects and help you have the best quality of life possible.  You may receive a survey from a nurse that asks questions about your current state of health.  Based on the survey results, all eligible patients will be referred to the [facility] program for an evaluation so we can better serve you!  A frequently asked questions sheet is available upon request. Raloxifene tablets What is this medicine? RALOXIFENE (ral OX i feen) reduces the amount of calcium lost from bones. It is used to treat and prevent osteoporosis in women who have experienced menopause. This medicine may be used for other purposes; ask your health care provider or pharmacist if you have questions. COMMON BRAND NAME(S): Evista What should I tell my health care provider before I take this medicine? They need to know if you have any of these conditions: -a history of blood clots -cancer -heart failure -liver disease -premenopausal -an unusual or allergic  reaction to raloxifene, other medicines, foods, dyes, or preservatives -pregnant or trying to get pregnant -breast-feeding How should I use this medicine? Take this medicine by mouth with a glass of water. Follow the  directions on the prescription label. The tablets can be taken with or without food. Take your doses at regular intervals. Do not take your medicine more often than directed. Talk to your pediatrician regarding the use of this medicine in children. Special care may be needed. Overdosage: If you think you have taken too much of this medicine contact a poison control center or emergency room at once. NOTE: This medicine is only for you. Do not share this medicine with others. What if I miss a dose? If you miss a dose, take it as soon as you can. If it is almost time for your next dose, take only that dose. Do not take double or extra doses. What may interact with this medicine? -ampicillin -cholestyramine -colestipol -diazepam -diazoxide -female hormones like hormone replacement therapy -lidocaine -warfarin This list may not describe all possible interactions. Give your health care provider a list of all the medicines, herbs, non-prescription drugs, or dietary supplements you use. Also tell them if you smoke, drink alcohol, or use illegal drugs. Some items may interact with your medicine. What should I watch for while using this medicine? Visit your doctor or health care professional for regular checks on your progress. Do not stop taking this medicine except on the advice of your doctor or health care professional. You should make sure you get enough calcium and vitamin D in your diet while you are taking this medicine. Discuss your dietary needs with your health care professional or nutritionist. Exercise may help to prevent bone loss. Discuss your exercise needs with your doctor or health care professional. This medicine can rarely cause blood clots. You should avoid long periods of bed rest while taking this medicine. If you are going to have surgery, tell your doctor or health care professional that you are taking this medicine. This medicine should be stopped at least 3 days before surgery.  After surgery, it should be restarted only after you are walking again. It should not be restarted while you still need long periods of bed rest. You should not smoke while taking this medicine. Smoking may also increase your risk of blood clots. Smoking can also decrease the effects of this medicine. This medicine does not prevent hot flashes. It may cause hot flashes in some patients at the start of therapy. What side effects may I notice from receiving this medicine? Side effects that you should report to your doctor or health care professional as soon as possible: -change in vision -chest pain -difficulty breathing -leg pain or swelling -skin rash, itching Side effects that usually do not require medical attention (report to your doctor or health care professional if they continue or are bothersome): -fluid build-up -leg cramps -stomach pain -sweating This list may not describe all possible side effects. Call your doctor for medical advice about side effects. You may report side effects to FDA at 1-800-FDA-1088. Where should I keep my medicine? Keep out of the reach of children. Store at room temperature between 15 and 30 degrees C (59 and 86 degrees F). Throw away any unused medicine after the expiration date. NOTE: This sheet is a summary. It may not cover all possible information. If you have questions about this medicine, talk to your doctor, pharmacist, or health care provider.  2015, Elsevier/Gold Standard. (2008-05-15  15:15:14)  

## 2013-12-24 NOTE — Progress Notes (Signed)
[place] A. Barnet Glasgow, M.D.  NEW PATIENT EVALUATION   Name: Christine Sutton Date: 12/24/2013 MRN: [id number] DOB: 03/16/1930  PCP: Asencion Noble, MD   REFERRING PHYSICIAN: Asencion Noble, MD  REASON FOR REFERRAL: Followup of breast cancer originally diagnosed in 2012.     HISTORY OF PRESENT ILLNESS:Christine Sutton is a 80 y.o. female who is referred by her family physician for followup of ER positive left breast cancer, status post left mastectomy with sentinel lobe biopsy on 03/08/2011 following which she received no additional treatment with very small area of invasion, exact size unknown. She transferred care from another [facility] because of proximity to her home. She feels well with excellent appetite and occasional back pain due to previous disc disease, status post surgery. She denies any hot flashes, vaginal bleeding, incontinence, lower extremity swelling or redness, abnormalities on self breast examination, fever, night sweats, sore throat, cough, wheezing, diarrhea, constipation, dysuria, hematuria, melena, hematochezia, epistaxis, hemoptysis, chest pain, PND, orthopnea, palpitations, headache, or seizures.   PAST MEDICAL HISTORY:  has a past medical history of Hypertension; Diabetes mellitus; High blood cholesterol level; and breast cancer.     PAST SURGICAL HISTORY: Past Surgical History  Procedure Laterality Date  . Mastectomy Left   . Back surgery      lower     CURRENT MEDICATIONS: has a current medication list which includes the following prescription(s): acetaminophen, amlodipine, aspirin, oyster shell calcium/d, diphenhydramine-acetaminophen, lisinopril-hydrochlorothiazide, metformin, multiple vitamins-minerals, polyethylene glycol, simvastatin, alprazolam, and hydrocodone-acetaminophen.   ALLERGIES: Codeine   SOCIAL HISTORY:  reports that she has never smoked. She has never used smokeless tobacco. She reports  that she does not drink alcohol or use illicit drugs.   FAMILY HISTORY: family history includes Cancer in her mother, sister, and sister.    REVIEW OF SYSTEMS:  Other than that discussed above is noncontributory.    PHYSICAL EXAM:  height is 5' 5.5" (1.664 m) and weight is 141 lb 11.2 oz (64.275 kg).    GENERAL:alert, no distress and comfortable SKIN: skin color, texture, turgor are normal, no rashes or significant lesions EYES: normal, Conjunctiva are pink and non-injected, sclera clear OROPHARYNX:no exudate, no erythema and lips, buccal mucosa, and tongue normal  NECK: supple, thyroid normal size, non-tender, without nodularity CHEST: Status post left mastectomy with no subcutaneous nodules. Right breast is pendulous with no masses appreciated. LYMPH:  no palpable lymphadenopathy in the cervical, axillary or inguinal LUNGS: clear to auscultation and percussion with normal breathing effort HEART: regular rate & rhythm and no murmurs ABDOMEN:abdomen soft, non-tender and normal bowel sounds MUSCULOSKELETALl:no cyanosis of digits, no clubbing or edema  NEURO: alert & oriented x 3 with fluent speech, no focal motor/sensory deficits    LABORATORY DATA:   Recent labs done in PCP's office will be obtained.   No visits with results within 30 Day(s) from this visit. Latest known visit with results is:  Admission on 06/17/2009, Discharged on 06/22/2009  Component Date Value Ref Range Status  . ABO/RH(D) 06/17/2009 B POS   Final  . Antibody Screen 06/17/2009 POS   Final  . Sample Expiration 06/17/2009 06/20/2009   Final  . Sodium 06/11/2009 139  135 - 145 mEq/L Final  . Potassium 06/11/2009 4.3  3.5 - 5.1 mEq/L Final  . Chloride 06/11/2009 99  96 - 112 mEq/L Final  . CO2 06/11/2009 33* 19 - 32 mEq/L Final  . Glucose, Bld 06/11/2009 123* 70 -  99 mg/dL Final  . BUN 06/11/2009 19  6 - 23 mg/dL Final  . Creatinine, Ser 06/11/2009 0.72  0.4 - 1.2 mg/dL Final  . Calcium 06/11/2009  10.7* 8.4 - 10.5 mg/dL Final  . GFR calc non Af Amer 06/11/2009 >60  >60 mL/min Final  . GFR calc Af Amer 06/11/2009   >60 mL/min Final                   Value:>60                                The eGFR has been calculated                         using the MDRD equation.                         This calculation has not been                         validated in all clinical                         situations.                         eGFR's persistently                         <60 mL/min signify                         possible Chronic Kidney Disease.  . WBC 06/11/2009 10.4  4.0 - 10.5 K/uL Final  . RBC 06/11/2009 4.68  3.87 - 5.11 MIL/uL Final  . Hemoglobin 06/11/2009 14.5  12.0 - 15.0 g/dL Final  . HCT 06/11/2009 43.0  36.0 - 46.0 % Final  . MCV 06/11/2009 91.8  78.0 - 100.0 fL Final  . MCHC 06/11/2009 33.7  30.0 - 36.0 g/dL Final  . RDW 06/11/2009 14.2  11.5 - 15.5 % Final  . Platelets 06/11/2009 328  150 - 400 K/uL Final  . ABO/RH(D) 06/11/2009 B POS   Final  . Antibody Screen 06/11/2009 POS   Final  . Sample Expiration 06/11/2009 06/18/2009   Final  . Antibody Identification 97/67/3419 ANTI-K   Final  . DAT, IgG 06/11/2009 NEG   Final  . PT AG Type 06/11/2009 NEGATIVE FOR KELL ANTIGEN   Final  . Unit Number 06/11/2009 [id number]   Final  . Blood Component Type 06/11/2009 RED CELLS,LR   Final  . Status of Unit 06/11/2009 REL FROM [facility]   Final  . Donor AG Type 06/11/2009 NEGATIVE FOR KELL ANTIGEN   Final  . Transfusion Status 06/11/2009 OK TO TRANSFUSE   Final  . Crossmatch Result 06/11/2009 COMPATIBLE   Final  . Unit Number 06/11/2009 [id number]   Final  . Blood Component Type 06/11/2009 RED CELLS,LR   Final  . Status of Unit 06/11/2009 REL FROM [facility]   Final  . Donor AG Type 06/11/2009 NEGATIVE FOR KELL ANTIGEN   Final  . Transfusion Status 06/11/2009 OK TO TRANSFUSE   Final  . Crossmatch Result 06/11/2009 COMPATIBLE   Final    Urinalysis No results found for this  basename: colorurine, appearanceur, labspec, phurine, glucoseu,  hgbur, bilirubinur, ketonesur, proteinur, urobilinogen, nitrite, leukocytesur      @RADIOGRAPHY : MM Digital Screening Unilat R Status: Final result            Study Result    CLINICAL DATA: Screening.  EXAM:  RIGHT DIGITAL SCREENING MAMMOGRAM WITH CAD  COMPARISON: Previous exam(s)  ACR Breast Density Category c: The breast are heterogeneously dense,  which may obscure small masses.  FINDINGS:  The patient has had a left mastectomy. No suspicious masses,  architectural distortion, or calcifications are present. There are  no findings suspicious for malignancy. Images were processed with  CAD.  IMPRESSION:  No mammographic evidence of malignancy. A result letter of this  screening mammogram will be mailed directly to the patient.  RECOMMENDATION:  Screening mammogram in one year. (Code:SM-B-01Y)  BI-RADS CATEGORY 1. Negative  Electronically Signed  By: Enrique Sack  On: 03/12/2013       Mammogram scheduled for October 2015.  PATHOLOGY:  Extensive noninvasive ductal neoplasia of the left breast with a small focus of invasive ductal carcinoma, ER +100%, PR negative, Ki-67 of 13% with HER-2/neu not overexpressed.  for SHENICE, DOLDER ([id number]) Collection Date: 03/08/2011 Patient Name: ALICEA, WENTE Received Date: 03/08/2011 Case No: [id number] Physician cc: Asencion Noble MRN #: [id number] Chart #: [id number] DOB: 1929-08-22 Age: 54 Gender: F Client Name: [facility] Physician: Herma Mering, MD REPORT OF SURGICAL PATHOLOGY FINAL DIAGNOSIS Diagnosis Breast, left, needle core biopsy, upper outer quadrant - INVASIVE DUCTAL CARCINOMA WITH ASSOCIATED CALCIFICATIONS. 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 - SEE COMMENT. Microscopic Comment Although definitive grading of breast carcinoma is best done on excision, the features of the tumor from the  left upper outer quadrant are compatible with a grade II breast carcinoma. Breast prognostic markers will be performed and reported in an addendum. Findings are called to [facility] on 03/09/11. Dr. Lyndon Code has seen this case in consultation with agreement. (RH:mw 03-09-11) Willeen Niece MD Pathologist, Electronic Signature (Case signed 03/09/2011) PROGNOSTIC INDICATORS - ACIS Results IMMUNOHISTOCHEMICAL AND MORPHOMETRIC ANALYSIS BY THE AUTOMATED CELLULAR IMAGING SYSTEM (ACIS) Estrogen Receptor (Negative, <1%): 100%, STRONG STAINING INTENSITY Progesterone Receptor (Negative, <1%): 0%, NEGATIVE Proliferation Marker Ki67 by M IB-1 (Low<20%): 13% COMMENT: The negative hormone receptor study in this case has an internal positive control. All controls stained appropriately Enid Cutter MD Pathologist, Electronic Signature 1 of 3 FINAL for Imhof, Lisett B [phone]) (continued) ( Signed 03/16/2011) CHROMOGENIC IN-SITU HYBRIDIZATION Interpretation HER-2/NEU BY CISH - NO AMPLIFICATION OF HER-2 DETECTED. THE RATIO OF HER-2: CEP 17 SIGNALS WAS 1.49. Reference range: Ratio: HER2:CEP17 < 1.8 - gene amplification not observed Ratio: HER2:CEP 17 1.8-2.2 - equivocal result Ratio: HER2:CEP17 > 2.2 - gene amplification observed COMMENT: The vast majority of the tumor is high grade in situ carcinoma with small clusters of invasive component, which are almost exhausted on the special stain. Please repeat breast prognostic profile on the resection specimen if needed. ER, PR and Ki-67 will be performed on block 55F. Aldona Bar MD Pathologist, Electronic Signature ( Signed 03/10/2011) Specimen Gross and Clinical Information Specimen Comment Family h/o breast cancer in mother and sister; Calcs; In formalin at 11:35 AM; Extracted less than 10 minutes Specimen(s) Obtained: Breast, left, needle core biopsy, upper outer quadrant Specimen Clinical Information FCC or  fibroadenomatoid vs DCIS Gross Received in formalin are multiple cores of soft, tan yellow tissue which measure 0.6 x 0.3 x 0.3 cm and up  to 4.7 x 0.5 x 0.5 cm Submitted in six blocks A-F A - four cores circled on dish B - one core circled on dish C - two cores circled on dish D - three cores circled on dish E - one core circled on dish F - three cores ([place]:jes) Disclaimer Her2Neu by CISH (chromogenic in-situ hybridization) is performed at [facility] Pathology, using the [place] pharmDx Kit (code number [id number]) Estrogen receptor (SP1), immunohistochemical stains are performed on formalin fixed, paraffin embedded tissue using a 3,3"-diaminobenzidine (DAB) chromogen and DAKO Autostainer System. The staining intensity of the nucleus is scored morphometrically using the Automated Cellular Imaging System (ACIS) and is reported as the percentage of tumor cell nuclei demonstrating specific nuclear staining. Ki-67 (Mib-1), immunohistochemical stains are performed on formalin fixed, paraffin embedded tissue using a 3,3"-diaminobenzidine (DAB) chromogen and [place]. The staining intensity of the nucleus is scored morphometrically using the Automated Cellular Imaging System (ACIS) and is reported as the percentage of tumor cell 2 of 3 FINAL for Portman, Alexias B ([id number]) Disclaimer(continued) nuclei demonstrating specific nuclear staining. PR progesterone receptor (PgR 636), immunohistochemical stains are performed on formalin fixed, paraffin embedded tissue using a 3,3"-diaminobenzidine (DAB) chromogen and DAKO Autostainer System. The staining intensity of the nucleus is scored morphometrically using the Automated Cellular Imaging System (ACIS) and is reported as the percentage of tumor cell nuclei demonstrating specific nuclear staining. Report signed out from the following location(s) [address],[place]  [id number]., [place], [place], [place] [address].  IMPRESSION:  #1. Stage I left breast cancer, ER PR positive, PR negative, HER-2/neu negative, status post left mastectomy with sentinel biopsy, no evidence of disease. #2. Osteoporosis. #3. Lumbosacral disc disease. #4. Diabetes mellitus, type II, non-insulin requiring, controlled. #5. Hypertension, controlled.   PLAN:  #1. A discussion was held with the patient regarding the use of adjuvant endocrine treatment in an effort to decrease relapse of her original malignancy or any preventive mode to prevent new cancer in the opposite breast. #2. Because of the very good prognosis of her original invasive disease, I do not believe aggressive therapy with an aromatase inhibitor is indicated particularly in the setting of osteoporosis. Because she still has a uterus and because the incidence of malignancies of the uterus are high with tamoxifen in her age group, it also was not recommended. What was recommended is Evista 60 mg daily to serve as a breast cancer prevention agent at the same time treat her osteoporosis. She was given information about the drug and in fact the drug was ordered. #3. Followup in one month for tolerability.   Doroteo Bradford, MD 12/24/2013 3:27 PM   DISCLAIMER:  This note was dictated with voice recognition softwre.  Similar sounding words can inadvertently be transcribed inaccurately and may not be corrected upon review.

## 2013-12-24 NOTE — Progress Notes (Signed)
STAR Program Physical Impairment and Functional Assessment Screening Tool  1. Are you having any pain, including headaches, joint pain, or muscle pain (upper body = OT; lower body = PT)?   [    ]  No   [  X  ]  Yes, but I hand this before my cancer diagnosis.   [    ]  Yes, this started after my diagnosis and is still a problem.   [    ]  Yes, this is new since my last visit. 2. Do your hands and/or feet feel numb or tingle (PT)?   [ X   ]  No   [    ]  Yes, but I hand this before my cancer diagnosis.   [    ]  Yes, this started after my diagnosis and is still a problem.   [    ]  Yes, this is new since my last visit. 3. Does any part of your body feel swollen or larger than usual (upper body = OT; lower body = PT)?   [ X   ]  No   [    ]  Yes, but I hand this before my cancer diagnosis.   [    ]  Yes, this started after my diagnosis and is still a problem.   [    ]  Yes, this is new since my last visit. 4. Are you so tired that you cannot do the things you want or need to do (PT or OT)?   [ X   ]  No   [    ]  Yes, but I hand this before my cancer diagnosis.   [    ]  Yes, this started after my diagnosis and is still a problem.   [    ]  Yes, this is new since my last visit. 5. Are you feeling weak or are you having trouble moving any part of your body (PT/OT)?   [ X   ]  No   [    ]  Yes, but I hand this before my cancer diagnosis.   [    ]  Yes, this started after my diagnosis and is still a problem.   [    ]  Yes, this is new since my last visit. 6. Are you having trouble concentrating, thinking, or remembering things (OT/ST)?  Patient does not want referral at this time   [    ]  No   [    ]  Yes, but I hand this before my cancer diagnosis.   [  X ]  Yes, this started after my diagnosis and is still a problem.   [    ]  Yes, this is new since my last visit. 7. Are you having trouble moving around or feel like you might trip or fall (PT)?     [  X ]  No   [    ]  Yes, but I hand  this before my cancer diagnosis.   [    ]  Yes, this started after my diagnosis and is still a problem.   [    ]  Yes, this is new since my last visit. 8. Are you having trouble swallowing (ST)?   [  X  ]  No   [    ]  Yes, but I hand this before my cancer diagnosis.   [    ]  Yes, this started after my diagnosis and is still a problem.   [    ]  Yes, this is new since my last visit. 9. Are you having trouble speaking (ST)?   [  X  ]  No   [    ]  Yes, but I hand this before my cancer diagnosis.   [    ]  Yes, this started after my diagnosis and is still a problem.   [    ]  Yes, this is new since my last visit. 10. Are you having trouble with going or getting to the bathroom (OT)?   [  X  ]  No   [    ]  Yes, but I hand this before my cancer diagnosis.   [    ]  Yes, this started after my diagnosis and is still a problem.   [    ]  Yes, this is new since my last visit. 11. Are you having trouble with your sexual function (OT)?   [ X   ]  No   [    ]  Yes, but I hand this before my cancer diagnosis.   [    ]  Yes, this started after my diagnosis and is still a problem.   [    ]  Yes, this is new since my last visit. 12. Are you having trouble lifting things, even just your arms (OT/PT)?   [ X   ]  No   [    ]  Yes, but I hand this before my cancer diagnosis.   [    ]  Yes, this started after my diagnosis and is still a problem.   [    ]  Yes, this is new since my last visit. 81. Are you having trouble taking care of yourself as in dressing or bathing (OT)?   [ X   ]  No   [    ]  Yes, but I hand this before my cancer diagnosis.   [    ]  Yes, this started after my diagnosis and is still a problem.   [    ]  Yes, this is new since my last visit. 14. Are you having trouble with daily tasks like chores or shopping (OT)?   [ X   ]  No   [    ]  Yes, but I hand this before my cancer diagnosis.   [    ]  Yes, this started after my diagnosis and is still a problem.   [    ]  Yes, this  is new since my last visit. 15. Are you having trouble driving (OT)?   [ X   ]  No   [    ]  Yes, but I hand this before my cancer diagnosis.   [    ]  Yes, this started after my diagnosis and is still a problem.   [    ]  Yes, this is new since my last visit. 48. Are you having trouble returning to work or completing your tasks at work (OT)?   [ X   ]  No   [    ]  Yes, but I hand this before my cancer diagnosis.   [    ]  Yes, this started after my diagnosis and is still a problem.   [    ]  Yes, this is new since my last visit.  Other concerns:    Legend: OT = Occupational Therapy PT = Physical Therapy ST = Speech Therapy

## 2014-01-06 ENCOUNTER — Telehealth (HOSPITAL_COMMUNITY): Payer: Self-pay

## 2014-01-06 NOTE — Telephone Encounter (Signed)
Call from patient- states "I took that Evista for 8 days and stopped it.  I started having difficulty walking, my back started hurting and I just got in real bad shape.  After reading the side effects I decided that I did not want to take it and just wanted to let you know what was going on."

## 2014-01-24 ENCOUNTER — Encounter (HOSPITAL_COMMUNITY): Payer: Self-pay

## 2014-01-24 ENCOUNTER — Encounter (HOSPITAL_COMMUNITY): Payer: Medicare Other | Attending: Hematology and Oncology

## 2014-01-24 VITALS — BP 110/84 | HR 75 | Temp 98.1°F | Resp 18 | Wt 145.0 lb

## 2014-01-24 DIAGNOSIS — C50919 Malignant neoplasm of unspecified site of unspecified female breast: Secondary | ICD-10-CM | POA: Diagnosis not present

## 2014-01-24 DIAGNOSIS — E089 Diabetes mellitus due to underlying condition without complications: Secondary | ICD-10-CM

## 2014-01-24 DIAGNOSIS — E139 Other specified diabetes mellitus without complications: Secondary | ICD-10-CM

## 2014-01-24 DIAGNOSIS — I10 Essential (primary) hypertension: Secondary | ICD-10-CM

## 2014-01-24 NOTE — Progress Notes (Signed)
[facility]  OFFICE PROGRESS Christine Miner, MD [address] [place] [address]  DIAGNOSIS: Breast cancer, female, unspecified laterality  Diabetes mellitus due to underlying condition without complications  Hypertension, essential, benign  Chief Complaint  Patient presents with  . Breast Cancer  . Osteoporosis    CURRENT THERAPY: Trial of Evista 60 mg daily one month ago to treat both osteoporosis and as well to decrease the risk of second malignancy in the remaining breast.  INTERVAL HISTORY: Christine Sutton 80 y.o. female returns for followup of ER positive left breast cancer, status post left mastectomy with sentinel lobe biopsy on 03/08/2011 following which she received no additional treatment with very small area of invasion, exact size unknown. After taking Evista for 7 days she develop sciatic pain which she has had on multiple occasions in the past. She is also been treated with epidural injections and facet injections on that side. She stopped the drug and still had discomfort. She then sat by someone in the hospital for a long period time and had a reemergence of the discomfort despite being off Evista. She did be came frightened of having a potential blood clot and decided not to take medication anymore.   MEDICAL HISTORY: Past Medical History  Diagnosis Date  . Hypertension   . Diabetes mellitus   . High blood cholesterol level   . HX: breast cancer     left    INTERIM HISTORY: has Diabetes mellitus due to underlying condition without complications and Hypertension, essential, benign on her problem list.    ALLERGIES:  is allergic to codeine.  MEDICATIONS: has a current medication list which includes the following prescription(s): acetaminophen, alprazolam, amlodipine, aspirin, oyster shell calcium/d, diphenhydramine-acetaminophen, hydrocodone-acetaminophen, lisinopril-hydrochlorothiazide,  metformin, multiple vitamins-minerals, polyethylene glycol, simvastatin, and raloxifene.  SURGICAL HISTORY:  Past Surgical History  Procedure Laterality Date  . Mastectomy Left   . Back surgery      lower    FAMILY HISTORY: family history includes Cancer in her mother, sister, and sister.  SOCIAL HISTORY:  reports that she has never smoked. She has never used smokeless tobacco. She reports that she does not drink alcohol or use illicit drugs.  REVIEW OF SYSTEMS:  Other than that discussed above is noncontributory.  PHYSICAL EXAMINATION: ECOG PERFORMANCE STATUS: 1 - Symptomatic but completely ambulatory  Blood pressure 110/84, pulse 75, temperature 98.1 F (36.7 C), temperature source Oral, resp. rate 18, weight 145 lb (65.772 kg).  GENERAL:alert, no distress and comfortable SKIN: skin color, texture, turgor are normal, no rashes or significant lesions EYES: PERLA; Conjunctiva are pink and non-injected, sclera clear SINUSES: No redness or tenderness over maxillary or ethmoid sinuses OROPHARYNX:no exudate, no erythema on lips, buccal mucosa, or tongue. NECK: supple, thyroid normal size, non-tender, without nodularity. No masses CHEST: Status post left mastectomy with no subcutaneous nodules. Right breast is pendulous with no masses. LYMPH:  no palpable lymphadenopathy in the cervical, axillary or inguinal LUNGS: clear to auscultation and percussion with normal breathing effort HEART: regular rate & rhythm and no murmurs. ABDOMEN:abdomen soft, non-tender and normal bowel sounds MUSCULOSKELETAL:no cyanosis of digits and no clubbing. Range of motion normal. . Positive straight leg raising on the right. NEURO: alert & oriented x 3 with fluent speech, no focal motor/sensory deficits   LABORATORY DATA: No visits with results within 30 Day(s) from this visit. Latest known visit with results is:  Admission on  06/17/2009, Discharged on 06/22/2009  Component Date Value Ref Range Status    . ABO/RH(D) 06/17/2009 B POS   Final  . Antibody Screen 06/17/2009 POS   Final  . Sample Expiration 06/17/2009 06/20/2009   Final  . Sodium 06/11/2009 139  135 - 145 mEq/L Final  . Potassium 06/11/2009 4.3  3.5 - 5.1 mEq/L Final  . Chloride 06/11/2009 99  96 - 112 mEq/L Final  . CO2 06/11/2009 33* 19 - 32 mEq/L Final  . Glucose, Bld 06/11/2009 123* 70 - 99 mg/dL Final  . BUN 06/11/2009 19  6 - 23 mg/dL Final  . Creatinine, Ser 06/11/2009 0.72  0.4 - 1.2 mg/dL Final  . Calcium 06/11/2009 10.7* 8.4 - 10.5 mg/dL Final  . GFR calc non Af Amer 06/11/2009 >60  >60 mL/min Final  . GFR calc Af Amer 06/11/2009   >60 mL/min Final                   Value:>60                                The eGFR has been calculated                         using the MDRD equation.                         This calculation has not been                         validated in all clinical                         situations.                         eGFR's persistently                         <60 mL/min signify                         possible Chronic Kidney Disease.  . WBC 06/11/2009 10.4  4.0 - 10.5 K/uL Final  . RBC 06/11/2009 4.68  3.87 - 5.11 MIL/uL Final  . Hemoglobin 06/11/2009 14.5  12.0 - 15.0 g/dL Final  . HCT 06/11/2009 43.0  36.0 - 46.0 % Final  . MCV 06/11/2009 91.8  78.0 - 100.0 fL Final  . MCHC 06/11/2009 33.7  30.0 - 36.0 g/dL Final  . RDW 06/11/2009 14.2  11.5 - 15.5 % Final  . Platelets 06/11/2009 328  150 - 400 K/uL Final  . ABO/RH(D) 06/11/2009 B POS   Final  . Antibody Screen 06/11/2009 POS   Final  . Sample Expiration 06/11/2009 06/18/2009   Final  . Antibody Identification 13/14/3888 ANTI-K   Final  . DAT, IgG 06/11/2009 NEG   Final  . PT AG Type 06/11/2009 NEGATIVE FOR KELL ANTIGEN   Final  . Unit Number 06/11/2009 [id number]   Final  . Blood Component Type 06/11/2009 RED CELLS,LR   Final  . Status of Unit 06/11/2009 REL FROM [facility]   Final  . Donor AG Type 06/11/2009 NEGATIVE FOR KELL  ANTIGEN   Final  . Transfusion Status 06/11/2009 OK TO TRANSFUSE  Final  . Crossmatch Result 06/11/2009 COMPATIBLE   Final  . Unit Number 06/11/2009 [id number]   Final  . Blood Component Type 06/11/2009 RED CELLS,LR   Final  . Status of Unit 06/11/2009 REL FROM [facility]   Final  . Donor AG Type 06/11/2009 NEGATIVE FOR KELL ANTIGEN   Final  . Transfusion Status 06/11/2009 OK TO TRANSFUSE   Final  . Crossmatch Result 06/11/2009 COMPATIBLE   Final    PATHOLOGY: No new pathology.  Urinalysis No results found for this basename: colorurine,  appearanceur,  labspec,  phurine,  glucoseu,  hgbur,  bilirubinur,  ketonesur,  proteinur,  urobilinogen,  nitrite,  leukocytesur    RADIOGRAPHIC STUDIES: No results found.  ASSESSMENT:  #1. Stage I left breast cancer, ER positive, PR negative, HER-2/neu negative, status post left mastectomy with sentinel biopsy, minimally invasive, no evidence of disease. #2. Osteoporosis, intolerant of Evista. #3. Lumbosacral disc disease with sciatica. #4. Diabetes mellitus, type II, non-insulin requiring, controlled. #5. Hypertension, controlled.   PLAN:  #1. Discontinue Evista. #2. Mammogram scheduled for October 2015. #3. Office visit one year with CBC, chem profile, [place], [place] 27[address], vitamin D level.   All questions were answered. The patient knows to call the clinic with any problems, questions or concerns. We can certainly see the patient much sooner if necessary.   I spent 25 minutes counseling the patient face to face. The total time spent in the appointment was 30 minutes.    Doroteo Bradford, MD 01/24/2014 1:52 PM  DISCLAIMER:  This note was dictated with voice recognition software.  Similar sounding words can inadvertently be transcribed inaccurately and may not be corrected upon review.

## 2014-01-24 NOTE — Patient Instructions (Signed)
[facility] Discharge Instructions  RECOMMENDATIONS MADE BY THE CONSULTANT AND ANY TEST RESULTS WILL BE SENT TO YOUR REFERRING PHYSICIAN.  Return in one year for office visit and lab work.   Thank you for choosing [facility] to provide your oncology and hematology care.  To afford each patient quality time with our providers, please arrive at least 15 minutes before your scheduled appointment time.  With your help, our goal is to use those 15 minutes to complete the necessary work-up to ensure our physicians have the information they need to help with your evaluation and healthcare recommendations.    Effective January 1st, 2014, we ask that you re-schedule your appointment with our physicians should you arrive 10 or more minutes late for your appointment.  We strive to give you quality time with our providers, and arriving late affects you and other patients whose appointments are after yours.    Again, thank you for choosing [facility]  Our hope is that these requests will decrease the amount of time that you wait before being seen by our physicians.       _____________________________________________________________  Should you have questions after your visit to [facility], please contact our office at [phone] between the hours of 8:30 a.m. and 4:30 p.m.  Voicemails left after 4:30 p.m. will not be returned until the following business day.  For prescription refill requests, have your pharmacy contact our office with your prescription refill request.    _______________________________________________________________  We hope that we have given you very good care.  You may receive a patient satisfaction survey in the mail, please complete it and return it as soon as possible.  We value your feedback!  _______________________________________________________________  Have you asked about our STAR program?  STAR stands for  Survivorship Training and Rehabilitation, and this is a nationally recognized cancer care program that focuses on survivorship and rehabilitation.  Cancer and cancer treatments may cause problems, such as, pain, making you feel tired and keeping you from doing the things that you need or want to do. Cancer rehabilitation can help. Our goal is to reduce these troubling effects and help you have the best quality of life possible.  You may receive a survey from a nurse that asks questions about your current state of health.  Based on the survey results, all eligible patients will be referred to the [facility] program for an evaluation so we can better serve you!  A frequently asked questions sheet is available upon request.

## 2014-01-27 ENCOUNTER — Telehealth (HOSPITAL_COMMUNITY): Payer: Self-pay | Admitting: *Deleted

## 2014-01-27 ENCOUNTER — Encounter (HOSPITAL_COMMUNITY): Payer: Self-pay | Admitting: Oncology

## 2014-01-27 DIAGNOSIS — C50912 Malignant neoplasm of unspecified site of left female breast: Secondary | ICD-10-CM

## 2014-01-27 HISTORY — DX: Malignant neoplasm of unspecified site of left female breast: C50.912

## 2014-03-13 ENCOUNTER — Ambulatory Visit (HOSPITAL_COMMUNITY)
Admission: RE | Admit: 2014-03-13 | Discharge: 2014-03-13 | Disposition: A | Discharge status: Home / Self Care | Payer: Medicare Other | Source: Ambulatory Visit | Attending: Internal Medicine | Admitting: Internal Medicine

## 2014-03-13 DIAGNOSIS — Z1231 Encounter for screening mammogram for malignant neoplasm of breast: Secondary | ICD-10-CM | POA: Insufficient documentation

## 2014-03-24 DIAGNOSIS — E119 Type 2 diabetes mellitus without complications: Secondary | ICD-10-CM | POA: Diagnosis not present

## 2014-03-31 DIAGNOSIS — I10 Essential (primary) hypertension: Secondary | ICD-10-CM | POA: Diagnosis not present

## 2014-03-31 DIAGNOSIS — M545 Low back pain: Secondary | ICD-10-CM | POA: Diagnosis not present

## 2014-03-31 DIAGNOSIS — E119 Type 2 diabetes mellitus without complications: Secondary | ICD-10-CM | POA: Diagnosis not present

## 2014-03-31 DIAGNOSIS — Z23 Encounter for immunization: Secondary | ICD-10-CM | POA: Diagnosis not present

## 2014-07-24 DIAGNOSIS — E119 Type 2 diabetes mellitus without complications: Secondary | ICD-10-CM | POA: Diagnosis not present

## 2014-07-31 DIAGNOSIS — E119 Type 2 diabetes mellitus without complications: Secondary | ICD-10-CM | POA: Diagnosis not present

## 2014-07-31 DIAGNOSIS — I10 Essential (primary) hypertension: Secondary | ICD-10-CM | POA: Diagnosis not present

## 2014-09-12 DIAGNOSIS — M546 Pain in thoracic spine: Secondary | ICD-10-CM | POA: Diagnosis not present

## 2014-09-12 DIAGNOSIS — M4726 Other spondylosis with radiculopathy, lumbar region: Secondary | ICD-10-CM | POA: Diagnosis not present

## 2014-09-12 DIAGNOSIS — G6289 Other specified polyneuropathies: Secondary | ICD-10-CM | POA: Diagnosis not present

## 2014-09-12 DIAGNOSIS — M4155 Other secondary scoliosis, thoracolumbar region: Secondary | ICD-10-CM | POA: Diagnosis not present

## 2014-09-12 DIAGNOSIS — M5136 Other intervertebral disc degeneration, lumbar region: Secondary | ICD-10-CM | POA: Diagnosis not present

## 2014-09-12 DIAGNOSIS — M5416 Radiculopathy, lumbar region: Secondary | ICD-10-CM | POA: Diagnosis not present

## 2014-09-16 DIAGNOSIS — M5416 Radiculopathy, lumbar region: Secondary | ICD-10-CM | POA: Diagnosis not present

## 2014-09-29 DIAGNOSIS — M5416 Radiculopathy, lumbar region: Secondary | ICD-10-CM | POA: Diagnosis not present

## 2014-10-20 ENCOUNTER — Ambulatory Visit (HOSPITAL_COMMUNITY): Payer: Medicare Other | Attending: Internal Medicine | Admitting: Physical Therapy

## 2014-10-20 DIAGNOSIS — R269 Unspecified abnormalities of gait and mobility: Secondary | ICD-10-CM | POA: Diagnosis not present

## 2014-10-20 DIAGNOSIS — M5416 Radiculopathy, lumbar region: Secondary | ICD-10-CM | POA: Insufficient documentation

## 2014-10-20 DIAGNOSIS — M5410 Radiculopathy, site unspecified: Secondary | ICD-10-CM

## 2014-10-20 DIAGNOSIS — R293 Abnormal posture: Secondary | ICD-10-CM | POA: Diagnosis not present

## 2014-10-20 DIAGNOSIS — R6889 Other general symptoms and signs: Secondary | ICD-10-CM | POA: Insufficient documentation

## 2014-10-20 DIAGNOSIS — R531 Weakness: Secondary | ICD-10-CM | POA: Insufficient documentation

## 2014-10-20 DIAGNOSIS — M2170 Unequal limb length (acquired), unspecified site: Secondary | ICD-10-CM | POA: Insufficient documentation

## 2014-10-20 NOTE — Therapy (Signed)
[place], [place], [address] Phone: [phone]   Fax:  [phone]  Physical Therapy Evaluation  Patient Details  Name: Christine Sutton MRN: [id number] Date of Birth: 10/31/1929 Referring Provider:  Asencion Noble, MD  Encounter Date: 10/20/2014      PT End of Session - 10/20/14 1612    Visit Number 1   Number of Visits 16   Date for PT Re-Evaluation 11/17/14   Authorization Type Medicare/UHC    Authorization Time Period 10/20/14 to 12/20/14   Authorization - Visit Number 1   Authorization - Number of Visits 10   PT Start Time 9038   PT Stop Time 1519   PT Time Calculation (min) 43 min   Activity Tolerance Patient tolerated treatment well;Patient limited by pain   Behavior During Therapy [facility] for tasks assessed/performed      Past Medical History  Diagnosis Date  . Hypertension   . Diabetes mellitus   . High blood cholesterol level   . HX: breast cancer     left  . 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 Mastectomy Left   . Back surgery      lower    There were no vitals filed for this visit.  Visit Diagnosis:  Radicular low back pain - Plan: PT plan of care cert/re-cert  Generalized weakness - Plan: PT plan of care cert/re-cert  Poor posture - Plan: PT plan of care cert/re-cert  Abnormality of gait - Plan: PT plan of care cert/re-cert  Decreased functional activity tolerance - Plan: PT plan of care cert/re-cert  Leg length discrepancy - Plan: PT plan of care cert/re-cert      Subjective Assessment - 10/20/14 1442    Subjective Patient states she has been able to do her housework, but has not been out to eat or out to church for months because she has difficulty sitting for a long period of time. Patient has been doing some exercises for her legs and tries to walk around 20 minutes, but reports she has a lot of  problems squatting.    Pertinent History Had back surgery in 2011, however this past February patient started having burning down the back of her leg. Was referred back to her PCP by her back doctor, who trialed her on prednisone that does not seemed to have helped much so far. MD then recommended trailing therapy.  History of prior back surgery.    How long can you sit comfortably? Depends on what type of chair she is in- for standard dinner chair, maybe 30 minutes but has been using lift chair at home all the time    How long can you stand comfortably? 5 minutes before severe pain starts    How long can you walk comfortably? 20 minutes has been her limit so far at a leisurely pace    Patient Stated Goals get up and down better   Currently in Pain? Yes   Pain Score 6    Pain Location Other (Comment)  back and side of R leg    Pain Orientation Right;Lower   Pain Descriptors / Indicators Burning;Tingling;Numbness;Sharp;Shooting            [facility] PT Assessment - 10/20/14 0001    Assessment   Medical Diagnosis R lumbar radiculopathy    Onset Date 07/22/14  approximate   Next MD  Visit Dr. Willey Blade every four months for blood work   Precautions   Precautions None   Restrictions   Weight Bearing Restrictions No   Balance Screen   Has the patient fallen in the past 6 months No   Has the patient had a decrease in activity level because of a fear of falling?  Yes   Is the patient reluctant to leave their home because of a fear of falling?  No   Prior Function   Level of Independence Independent with basic ADLs;Independent with homemaking with ambulation;Independent with gait;Needs assistance with transfers;Other (comment)  has been using lift chair for sit to stand    Vocation Retired   Leisure shopping and cleaning house, working in yard    Observation/Other Assessments   Focus on Therapeutic Outcomes (FOTO)  62% limited   Posture/Postural Control   Posture Comments scoliotic posture  (appears concave to L/convex to R in lumbar spine), rounded shoulders, R hip is a little lower than L- also appears rotated to L at pelvis, valgus knees, patient states she does wear a small lift in her right shoe. Spine surgery in 2011 was to help correct scoliotic posture.   L leg shorter than R in supine before and after bridge   AROM   Right Hip External Rotation  36   Right Hip Internal Rotation  33   Left Hip External Rotation  36   Left Hip Internal Rotation  29   Lumbar Flexion 75   Lumbar Extension 14   Lumbar - Right Side Bend 23   Lumbar - Left Side Bend 22   Strength   Overall Strength Comments patient states she is in a lot of pain when she first gets on her back, but then the pain calms down   core strength approximately 2+/5 to 3-/5   Right Hip Flexion 3/5   Right Hip Extension 2/5   Right Hip ABduction 2+/5   Left Hip Flexion 3/5   Left Hip Extension 2/5   Left Hip ABduction 2+/5   Right Knee Flexion 3+/5   Right Knee Extension 3+/5   Left Knee Flexion 3+/5   Left Knee Extension 4-/5   Right Ankle Dorsiflexion 3+/5   Left Ankle Dorsiflexion 4-/5   Ambulation/Gait   Gait Comments reduced rotation of pelvis and trunk, reduced arm swing, reduced step and stride length B, apparent pelvic shift to L during gait, flexed at hips                           PT Education - 10/20/14 1612    Education provided Yes   Education Details Prognosis, plan of care, HEP   Person(s) Educated Patient   Methods Explanation;Handout;Demonstration   Comprehension Verbalized understanding;Need further instruction          PT Short Term Goals - 10/20/14 1619    PT SHORT TERM GOAL #1   Title Patient will complain of no more than 4/10 pain when she performs functional tasks and activities around her home with no assistive device with no burning sensation or pain down R LE    Time 4   Period Weeks   Status New   PT SHORT TERM GOAL #2   Title Patient will  demonstrate the ability to consistently perform efficient sit to stand transfer from surfaces of various heights and only occasional cues for sequencing and technique; she will also report that she is no longer using the  lift chair at home    Time 4   Period Weeks   Status New   PT SHORT TERM GOAL #3   Title Patient will demonstrate at least 40 degrees bilateral hip ER and at least 38 degrees bilateral hip IR    Time 4   Period Weeks   Status New   PT SHORT TERM GOAL #4   Title Patient to be able to verbally state the importance of maintaining correct posture and will consistently demonstrate proper posture through all functional tasks and activities   Time 4   Period Weeks   Status New           PT Long Term Goals - 10/20/14 1622    PT LONG TERM GOAL #1   Title Patient will correctly and consistently perform appropriate HEP with updates to exercises by PT staff as needed    Time 8   Period Weeks   Status New   PT LONG TERM GOAL #2   Title Patient will demonstrate consistent improvement in leg length discrepany as evidenced by not needing functional correction of leg length discrepancy during skilled PT sessions    Time 8   Period Weeks   Status New   PT LONG TERM GOAL #3   Title Patient will be able to sit for at least 90 minutes in order to improve her ability to participate in functional activities such as church related events and driving    Time 8   Period Weeks   Status New   PT LONG TERM GOAL #4   Title Patient will be able to stand statically for at least 45 minutes and will be able to ambulate unlimited distances without assistive device in order to enhance her ability to perform functional tasks such as going to the grocery store    Time 8   Period Weeks   Status New   PT LONG TERM GOAL #5   Title Patient will demonstrate the ability to safely lift grocery bag weighing up to 25 pounds with appropriate form and good safety awareness in order to reduce chances of  re-injuring her back    Time 8   Period Weeks   Status New               Plan - 10/20/14 1613    Clinical Impression Statement Patient presents with reduced overall lumbar range of motion, postural impairments including scoliotic curve, apparent leg length discrepancy, back and R leg pain, generalized weakness in bilateral lower extremities/proximal muscles/core, reduced bilateral hip ROM, reduced functional activity tolerance, gait deviations, and overall reduced functional task performance skills. She reports that she has a walker and cane at home she sometimes uses if her pain is really elevated.The patient states that she had extensive surgery on her back in 2011 and that her MD has told her he cannot do a lot more in terms of surgical correction; she also insists that her R leg is shorter but upon examination of leg length discrepancy in supine, left leg appeared shorter and discrepancy did improve with active bridge. Patient also states that she has been using lift chair at home for sit to stand due to difficulty with mobility. HEP was made conservative in terms of number and type of activities due to patient's high levels of pain at this time. Patient will benefit from skilled PT services in order to address these deficits and to asssist her in reaching an optimal level of function.  Pt will benefit from skilled therapeutic intervention in order to improve on the following deficits Abnormal gait;Decreased coordination;Decreased range of motion;Difficulty walking;Impaired flexibility;Improper body mechanics;Decreased endurance;Impaired sensation;Postural dysfunction;Decreased activity tolerance;Decreased balance;Pain;Decreased mobility;Decreased strength;Impaired perceived functional ability   Rehab Potential Good   Clinical Impairments Affecting Rehab Potential chronic back problems    PT Frequency 2x / week   PT Duration 8 weeks   PT Treatment/Interventions ADLs/Self Care Home  Management;Gait training;Neuromuscular re-education;Stair training;Biofeedback;Functional mobility training;Patient/family education;Cryotherapy;Therapeutic activities;Therapeutic exercise;Manual techniques;Energy conservation;Moist Heat;DME Instruction;Balance training   PT Next Visit Plan 3D excursions for hips, thoracic spine; functional stretching; postural theraband exercise; postural training; correct leg length discrepancy    PT Home Exercise Plan given    Consulted and Agree with Plan of Care Patient          G-Codes - Nov 04, 2014 1627    Functional Assessment Tool Used FOTO    Functional Limitation Mobility: Walking and moving around   Mobility: Walking and Moving Around Current Status [phone]) At least 60 percent but less than 80 percent impaired, limited or restricted   Mobility: Walking and Moving Around Goal Status [phone]) At least 40 percent but less than 60 percent impaired, limited or restricted       Problem List Patient Active Problem List   Diagnosis Date Noted  . 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 Diabetes mellitus due to underlying condition without complications 75/44/9201  . Hypertension, essential, benign 12/24/2013   Deniece Ree PT, DPT [place] [address] [place], [place], [address] Phone: [phone]   Fax:  [phone]

## 2014-10-20 NOTE — Patient Instructions (Signed)
EXTENSION: Standing (Active)   Stand, both feet flat. Draw right leg behind body as far as possible. Use __0_ lbs. Complete _1__ sets of _5-10__ repetitions. Perform _2__ sessions per day.  http://gtsc.exer.us/77   Copyright  VHI. All rights reserved.  ABDUCTION: Standing (Active)   Stand, feet flat. Lift right leg out to side. Use _0__ lbs. Complete __1_ sets of _5-10__ repetitions. Perform _2__ sessions per day.  http://gtsc.exer.us/111   Copyright  VHI. All rights reserved.    3D HIP EXCURSIONS - stand with your feet shoulder width apart, hands on hips; move from your hips, not your back  - 1) move your hips forward and backwards -2) move hips side to side  -3) rotate from your hips  Repeat 10 times each motion; do not go through any pain, just up to the point of pain.

## 2014-10-27 ENCOUNTER — Ambulatory Visit (HOSPITAL_COMMUNITY): Payer: Medicare Other | Admitting: Physical Therapy

## 2014-10-27 DIAGNOSIS — R293 Abnormal posture: Secondary | ICD-10-CM

## 2014-10-27 DIAGNOSIS — M2170 Unequal limb length (acquired), unspecified site: Secondary | ICD-10-CM

## 2014-10-27 DIAGNOSIS — R531 Weakness: Secondary | ICD-10-CM

## 2014-10-27 DIAGNOSIS — R6889 Other general symptoms and signs: Secondary | ICD-10-CM

## 2014-10-27 DIAGNOSIS — R269 Unspecified abnormalities of gait and mobility: Secondary | ICD-10-CM | POA: Diagnosis not present

## 2014-10-27 DIAGNOSIS — M5416 Radiculopathy, lumbar region: Secondary | ICD-10-CM | POA: Diagnosis not present

## 2014-10-27 DIAGNOSIS — M5410 Radiculopathy, site unspecified: Secondary | ICD-10-CM

## 2014-10-27 NOTE — Therapy (Signed)
[place], [place], [address] Phone: [phone]   Fax:  [phone]  Physical Therapy Treatment  Patient Details  Name: Christine Sutton MRN: [id number] Date of Birth: 09/15/1929 Referring Provider:  Asencion Noble, MD  Encounter Date: 10/27/2014      PT End of Session - 10/27/14 1453    Visit Number 2   Number of Visits 16   Date for PT Re-Evaluation 11/17/14   Authorization Type Medicare/UHC    Authorization Time Period 10/20/14 to 12/20/14   Authorization - Visit Number 2   Authorization - Number of Visits 10   PT Start Time 1027   PT Stop Time 1435   PT Time Calculation (min) 50 min   Activity Tolerance Patient tolerated treatment well;Patient limited by pain   Behavior During Therapy [facility] for tasks assessed/performed      Past Medical History  Diagnosis Date  . Hypertension   . Diabetes mellitus   . High blood cholesterol level   . HX: breast cancer     left  . 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 Mastectomy Left   . Back surgery      lower    There were no vitals filed for this visit.  Visit Diagnosis:  Radicular low back pain  Generalized weakness  Poor posture  Abnormality of gait  Decreased functional activity tolerance  Leg length discrepancy          [facility] PT Assessment - 10/27/14 0001    Assessment   Medical Diagnosis R lumbar radiculopathy    Onset Date 07/22/14   Next MD Visit Dr. Willey Blade every four months for blood work   Observation/Other Assessments   Observations Leg length measurements ASIS to medial malleolus:  Rt 93.5 cm, Lt: 97cm                     [facility] Adult PT Treatment/Exercise - 10/27/14 0001    Lumbar Exercises: Standing   Other Standing Lumbar Exercises hip extension and abduction (HEP review 10 reps each)   Other Standing Lumbar Exercises 3D hip excursions 10  reps   Lumbar Exercises: Supine   Bridge 10 reps   Straight Leg Raise 10 reps   Straight Leg Raises Limitations bilaterally   Manual Therapy   Manual Therapy Other (comment)   Manual therapy comments muscle energy technique for Rt anterior rotation                PT Education - 10/27/14 1459    Education provided Yes   Education Details HEP instruction,  Explanation of initial evaluation and goals.  Instructions for 3D hip excursions   Person(s) Educated Patient   Methods Explanation;Handout   Comprehension Verbalized understanding;Returned demonstration          PT Short Term Goals - 10/20/14 1619    PT SHORT TERM GOAL #1   Title Patient will complain of no more than 4/10 pain when she performs functional tasks and activities around her home with no assistive device with no burning sensation or pain down R LE    Time 4   Period Weeks   Status New   PT SHORT TERM GOAL #2   Title Patient will demonstrate the ability to consistently perform efficient sit to stand transfer from surfaces of various heights and only occasional cues for sequencing  and technique; she will also report that she is no longer using the lift chair at home    Time 4   Period Weeks   Status New   PT SHORT TERM GOAL #3   Title Patient will demonstrate at least 40 degrees bilateral hip ER and at least 38 degrees bilateral hip IR    Time 4   Period Weeks   Status New   PT SHORT TERM GOAL #4   Title Patient to be able to verbally state the importance of maintaining correct posture and will consistently demonstrate proper posture through all functional tasks and activities   Time 4   Period Weeks   Status New           PT Long Term Goals - 10/20/14 1622    PT LONG TERM GOAL #1   Title Patient will correctly and consistently perform appropriate HEP with updates to exercises by PT staff as needed    Time 8   Period Weeks   Status New   PT LONG TERM GOAL #2   Title Patient will demonstrate  consistent improvement in leg length discrepany as evidenced by not needing functional correction of leg length discrepancy during skilled PT sessions    Time 8   Period Weeks   Status New   PT LONG TERM GOAL #3   Title Patient will be able to sit for at least 90 minutes in order to improve her ability to participate in functional activities such as church related events and driving    Time 8   Period Weeks   Status New   PT LONG TERM GOAL #4   Title Patient will be able to stand statically for at least 45 minutes and will be able to ambulate unlimited distances without assistive device in order to enhance her ability to perform functional tasks such as going to the grocery store    Time 8   Period Weeks   Status New   PT LONG TERM GOAL #5   Title Patient will demonstrate the ability to safely lift grocery bag weighing up to 25 pounds with appropriate form and good safety awareness in order to reduce chances of re-injuring her back    Time 8   Period Weeks   Status New               Plan - 10/27/14 1453    Clinical Impression Statement Pt with questions/concerns regarding HEP.  reviewed HEP and given copy of initial evaluation.  Goals and measurements were explained to patient.  Bilateral LE's were measured for leg length descrepancy.  Rt LE found to be shorter than Lt LE as measured from ASIS to medial malleolus.  Rt hip was also anteriorly rotated with muscle energy technique completed to correct.  Pt reported no significant change at EOS regarding pain, however felt better about her HEP.  Added bridge and SLR today but did not add to HEP.    PT Next Visit Plan Ensure patient is completing HEP correctly.  Check SI alignment.  Continue with 3D excursions for hips.  Add thoracic spine excursions; functional stretching/strengthening; postural theraband exercise. All LE musculature is weak.   PT Home Exercise Plan given pic for 3D hip excursions ([employer] handout)   Consulted and Agree  with Plan of Care Patient        Problem List Patient Active Problem List   Diagnosis Date Noted  . 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  Diabetes mellitus due to underlying condition without complications 09/90/6893  . Hypertension, essential, benign 12/24/2013    Teena Irani, PTA/CLT [phone]  10/27/2014, 3:03 PM  [place] [address] [place], [place], [address] Phone: [phone]   Fax:  [phone]

## 2014-10-28 DIAGNOSIS — H25043 Posterior subcapsular polar age-related cataract, bilateral: Secondary | ICD-10-CM | POA: Diagnosis not present

## 2014-10-28 DIAGNOSIS — E119 Type 2 diabetes mellitus without complications: Secondary | ICD-10-CM | POA: Diagnosis not present

## 2014-10-28 DIAGNOSIS — H2513 Age-related nuclear cataract, bilateral: Secondary | ICD-10-CM | POA: Diagnosis not present

## 2014-10-30 ENCOUNTER — Ambulatory Visit (HOSPITAL_COMMUNITY): Payer: Medicare Other

## 2014-10-30 DIAGNOSIS — R293 Abnormal posture: Secondary | ICD-10-CM | POA: Diagnosis not present

## 2014-10-30 DIAGNOSIS — R269 Unspecified abnormalities of gait and mobility: Secondary | ICD-10-CM

## 2014-10-30 DIAGNOSIS — M2170 Unequal limb length (acquired), unspecified site: Secondary | ICD-10-CM | POA: Diagnosis not present

## 2014-10-30 DIAGNOSIS — R531 Weakness: Secondary | ICD-10-CM

## 2014-10-30 DIAGNOSIS — M5416 Radiculopathy, lumbar region: Secondary | ICD-10-CM | POA: Diagnosis not present

## 2014-10-30 DIAGNOSIS — R6889 Other general symptoms and signs: Secondary | ICD-10-CM

## 2014-10-30 DIAGNOSIS — M5410 Radiculopathy, site unspecified: Secondary | ICD-10-CM

## 2014-10-30 NOTE — Therapy (Signed)
[place], [place], [address] Phone: [phone]   Fax:  [phone]  Physical Therapy Treatment  Patient Details  Name: Christine Sutton MRN: [id number] Date of Birth: 1929-10-27 Referring Provider:  Asencion Noble, MD  Encounter Date: 10/30/2014      PT End of Session - 10/30/14 1523    Visit Number 3   Number of Visits 16   Date for PT Re-Evaluation 11/17/14   Authorization Type Medicare/UHC    Authorization Time Period 10/20/14 to 12/20/14   Authorization - Visit Number 3   Authorization - Number of Visits 10   PT Start Time 7035   PT Stop Time 1600   PT Time Calculation (min) 42 min   Activity Tolerance Patient tolerated treatment well   Behavior During Therapy [facility] for tasks assessed/performed      Past Medical History  Diagnosis Date  . Hypertension   . Diabetes mellitus   . High blood cholesterol level   . HX: breast cancer     left  . 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 Mastectomy Left   . Back surgery      lower    There were no vitals filed for this visit.  Visit Diagnosis:  Radicular low back pain  Generalized weakness  Poor posture  Abnormality of gait  Decreased functional activity tolerance  Leg length discrepancy      Subjective Assessment - 10/30/14 1521    Subjective Pt stated she has been compliant with HEP, pt c/o Rt radicular symptoms from Rt hip ending at Rt ankle   Currently in Pain? Yes   Pain Score 4    Pain Location Hip   Pain Orientation Right   Pain Descriptors / Indicators Burning   Pain Radiating Towards Rt LE to Rt ankle                          [facility] Adult PT Treatment/Exercise - 10/30/14 0001    Lumbar Exercises: Stretches   Pelvic Tilt Limitations A/P rotations seated 10x   Lumbar Exercises: Standing   Other Standing Lumbar Exercises 3D hip excursions  10 reps   Lumbar Exercises: Seated   Other Seated Lumbar Exercises 3D Thoracic excursion 10x   Lumbar Exercises: Supine   Bent Knee Raise 10 reps;5 seconds   Bent Knee Raise Limitations cueing for stability   Bridge 10 reps   Straight Leg Raise 10 reps   Straight Leg Raises Limitations bilaterally           PT Short Term Goals - 10/30/14 1552    PT SHORT TERM GOAL #1   Title Patient will complain of no more than 4/10 pain when she performs functional tasks and activities around her home with no assistive device with no burning sensation or pain down R LE    Status On-going   PT SHORT TERM GOAL #2   Title Patient will demonstrate the ability to consistently perform efficient sit to stand transfer from surfaces of various heights and only occasional cues for sequencing and technique; she will also report that she is no longer using the lift chair at home    Status On-going   PT SHORT TERM GOAL #3   Title Patient will demonstrate at least 40 degrees bilateral hip ER and at least 38 degrees bilateral  hip IR    Status On-going   PT SHORT TERM GOAL #4   Title Patient to be able to verbally state the importance of maintaining correct posture and will consistently demonstrate proper posture through all functional tasks and activities   Status On-going           PT Long Term Goals - 10/30/14 1553    PT LONG TERM GOAL #1   Title Patient will correctly and consistently perform appropriate HEP with updates to exercises by PT staff as needed    PT LONG TERM GOAL #2   Title Patient will demonstrate consistent improvement in leg length discrepany as evidenced by not needing functional correction of leg length discrepancy during skilled PT sessions    PT LONG TERM GOAL #3   Title Patient will be able to sit for at least 90 minutes in order to improve her ability to participate in functional activities such as church related events and driving    PT LONG TERM GOAL #4   Title Patient will be  able to stand statically for at least 45 minutes and will be able to ambulate unlimited distances without assistive device in order to enhance her ability to perform functional tasks such as going to the grocery store    PT [place] #5   Title Patient will demonstrate the ability to safely lift grocery bag weighing up to 25 pounds with appropriate form and good safety awareness in order to reduce chances of re-injuring her back                Plan - 10/30/14 1529    Clinical Impression Statement Noted  decreased lordotic curvature, pt with difficulty completeing pelvic A/P rotations when trying to check SI alignment, no MET complete due to inability to assure SI alignment.  3D hip excursion assisted with pelvic rotation with therapist facilitation to improve weight loading and overall form wtih squats.  Pt encouraged to reach forward with UE during squat and stand in front of chair when completeing this HEP for better weight loading.  Pt educated on importance of proper posture, added 3D thoracic excursions to improve thoracic mobility.  Pt stated pain reduced at end of session.  Pt given advanced HEP to add bridges and SLR to HEP for gluteal and hip flexor strengthening.     PT Next Visit Plan Ensure patient is completing HEP correctly.  Check SI alignment.  Continue with 3D excursions for thoracic and hips.  functional stretching/strengthening; postural theraband exercise. All LE musculature is weak.   PT Home Exercise Plan Bridges and SLR for gluteal and hip flexor strengthening        Problem List Patient Active Problem List   Diagnosis Date Noted  . 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 Diabetes mellitus due to underlying condition without complications 99/24/2683  . Hypertension, essential, benign 12/24/2013   Ihor Austin, [place]; [place] #[id number] [phone]  Aldona Lento 10/30/2014, 4:10 PM  [place], [place], [address] Phone: [phone]   Fax:  [phone]

## 2014-10-30 NOTE — Patient Instructions (Signed)
Bridge   Lie back, legs bent. Inhale, pressing hips up. Keeping ribs in, lengthen lower back. Exhale, rolling down along spine from top. Repeat 10-20 times. Do 1-2 sessions per day.  Copyright  VHI. All rights reserved.   Straight Leg Raise   Tighten stomach and slowly raise locked right leg ____ inches from floor. Repeat 10-20  times per set. Do 1-2 sets per session.   http://orth.exer.us/1103   Copyright  VHI. All rights reserved.

## 2014-11-04 DIAGNOSIS — H2511 Age-related nuclear cataract, right eye: Secondary | ICD-10-CM | POA: Diagnosis not present

## 2014-11-04 DIAGNOSIS — H25041 Posterior subcapsular polar age-related cataract, right eye: Secondary | ICD-10-CM | POA: Diagnosis not present

## 2014-11-05 ENCOUNTER — Ambulatory Visit (HOSPITAL_COMMUNITY): Payer: Medicare Other

## 2014-11-05 DIAGNOSIS — R6889 Other general symptoms and signs: Secondary | ICD-10-CM

## 2014-11-05 DIAGNOSIS — R293 Abnormal posture: Secondary | ICD-10-CM | POA: Diagnosis not present

## 2014-11-05 DIAGNOSIS — M5416 Radiculopathy, lumbar region: Secondary | ICD-10-CM | POA: Diagnosis not present

## 2014-11-05 DIAGNOSIS — M2170 Unequal limb length (acquired), unspecified site: Secondary | ICD-10-CM | POA: Diagnosis not present

## 2014-11-05 DIAGNOSIS — R269 Unspecified abnormalities of gait and mobility: Secondary | ICD-10-CM

## 2014-11-05 DIAGNOSIS — M5410 Radiculopathy, site unspecified: Secondary | ICD-10-CM

## 2014-11-05 DIAGNOSIS — R531 Weakness: Secondary | ICD-10-CM

## 2014-11-05 NOTE — Therapy (Signed)
[facility] [address] [place], , [address] Phone: [phone]   Fax:  [phone]  Physical Therapy Treatment  Patient Details  Name: Christine Sutton MRN: [id number] Date of Birth: 09/17/1929 Referring Provider:  Fagan, Roy, MD  Encounter Date: 11/05/2014      PT End of Session - 11/05/14 1337    Visit Number 4   Number of Visits 16   Date for PT Re-Evaluation 11/17/14   Authorization Type Medicare/UHC    Authorization Time Period 10/20/14 to 12/20/14   Authorization - Visit Number 4   Authorization - Number of Visits 10   PT Start Time 1300   PT Stop Time 1342   PT Time Calculation (min) 42 min   Activity Tolerance Patient tolerated treatment well   Behavior During Therapy WFL for tasks assessed/performed      Past Medical History  Diagnosis Date  . Hypertension   . Diabetes mellitus   . High blood cholesterol level   . HX: breast cancer     left  . 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 Mastectomy Left   . Back surgery      lower    There were no vitals filed for this visit.  Visit Diagnosis:  Radicular low back pain  Generalized weakness  Poor posture  Abnormality of gait  Decreased functional activity tolerance  Leg length discrepancy      Subjective Assessment - 11/05/14 1312    Subjective Pt stated Rt hip pain scale 5/10 today, reports of decreased burning to Rt ankle.     Currently in Pain? Yes   Pain Score 5    Pain Location Hip   Pain Orientation Right   Pain Descriptors / Indicators Aching;Burning            [facility] PT Assessment - 11/05/14 0001    Assessment   Medical Diagnosis R lumbar radiculopathy    Onset Date/Surgical Date 07/22/14   Next MD Visit Dr. Fagan every four months for blood work               [facility] Adult PT Treatment/Exercise - 11/05/14 0001    Lumbar Exercises: Stretches   Active Hamstring Stretch 3 reps;30 seconds   Active Hamstring Stretch Limitations supine with rope   Pelvic Tilt Limitations A/P rotations standing and seated 10x   Piriformis Stretch 2 reps;30 seconds   Piriformis Stretch Limitations seated    Lumbar Exercises: Standing   Other Standing Lumbar Exercises 3D hip excursions 10 reps   Lumbar Exercises: Seated   Other Seated Lumbar Exercises 3D Thoracic excursion 10x   Lumbar Exercises: Supine   Bent Knee Raise 10 reps;5 seconds   Bent Knee Raise Limitations cueing for stability   Bridge 10 reps   Bridge Limitations Rt closer to rear   Straight Leg Raise 10 reps   Straight Leg Raises Limitations Lt LE only following MET   Manual Therapy   Manual Therapy Other (comment)   Manual therapy comments muscle energy technique for Rt anterior rotation f/b core and gluteal strengthening.             PT Short Term Goals - 11/05/14 1639    PT SHORT TERM GOAL #1   Title Patient will complain of no more than 4/10 pain when she performs functional tasks and activities around her home with no assistive   device with no burning sensation or pain down R LE    Status On-going   PT SHORT TERM GOAL #2   Title Patient will demonstrate the ability to consistently perform efficient sit to stand transfer from surfaces of various heights and only occasional cues for sequencing and technique; she will also report that she is no longer using the lift chair at home    Status On-going   PT [place] #3   Title Patient will demonstrate at least 40 degrees bilateral hip ER and at least 38 degrees bilateral hip IR    Status On-going   PT SHORT TERM GOAL #4   Title Patient to be able to verbally state the importance of maintaining correct posture and will consistently demonstrate proper posture through all functional tasks and activities   Status On-going           PT Long Term Goals - 11/05/14 1640    PT LONG TERM GOAL #1   Title Patient will  correctly and consistently perform appropriate HEP with updates to exercises by PT staff as needed    PT LONG TERM GOAL #2   Title Patient will demonstrate consistent improvement in leg length discrepany as evidenced by not needing functional correction of leg length discrepancy during skilled PT sessions    PT LONG TERM GOAL #3   Title Patient will be able to sit for at least 90 minutes in order to improve her ability to participate in functional activities such as church related events and driving    PT LONG TERM GOAL #4   Title Patient will be able to stand statically for at least 45 minutes and will be able to ambulate unlimited distances without assistive device in order to enhance her ability to perform functional tasks such as going to the grocery store    PT [place] #5   Title Patient will demonstrate the ability to safely lift grocery bag weighing up to 25 pounds with appropriate form and good safety awareness in order to reduce chances of re-injuring her back                Plan - 11/05/14 1337    Clinical Impression Statement Began session with muscle energy techniques for Rt anterior rotation f/b instructions for pelvic floor contraction to assist with SI alignment.  Continued with thoracic and hip excursion exercises to improve spinal mobilty, cueing required to improve alignment during standing exercises to reduce ER.  Added piriformis and hamstring stretches to POC to improve mobilty.  Pt reported pain reduced to 3/10 at end of session following MET.     PT Next Visit Plan Ensure patient is completing HEP correctly.  Check SI alignment.  Continue with 3D excursions for thoracic and hips.  functional stretching/strengthening; postural theraband exercise. All LE musculature is weak.        Problem List Patient Active Problem List   Diagnosis Date Noted  . 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 Diabetes mellitus due to underlying condition without  complications 06/00/4599  . Hypertension, essential, benign 12/24/2013   Ihor Austin, [place]; [place] #[id number] [phone]  Aldona Lento 11/05/2014, 4:44 PM  [facility] [address] [place], [place], [address] Phone: [phone]   Fax:  [phone]

## 2014-11-07 ENCOUNTER — Ambulatory Visit (HOSPITAL_COMMUNITY): Payer: Medicare Other | Admitting: Physical Therapy

## 2014-11-07 DIAGNOSIS — R269 Unspecified abnormalities of gait and mobility: Secondary | ICD-10-CM | POA: Diagnosis not present

## 2014-11-07 DIAGNOSIS — M5416 Radiculopathy, lumbar region: Secondary | ICD-10-CM | POA: Diagnosis not present

## 2014-11-07 DIAGNOSIS — M2170 Unequal limb length (acquired), unspecified site: Secondary | ICD-10-CM

## 2014-11-07 DIAGNOSIS — M5410 Radiculopathy, site unspecified: Secondary | ICD-10-CM

## 2014-11-07 DIAGNOSIS — R6889 Other general symptoms and signs: Secondary | ICD-10-CM

## 2014-11-07 DIAGNOSIS — R293 Abnormal posture: Secondary | ICD-10-CM | POA: Diagnosis not present

## 2014-11-07 DIAGNOSIS — R531 Weakness: Secondary | ICD-10-CM

## 2014-11-07 NOTE — Therapy (Signed)
[facility] [place], [place], [address] Phone: [phone]   Fax:  [phone]  Physical Therapy Treatment  Patient Details  Name: Christine Sutton MRN: [id number] Date of Birth: 1930-01-28 Referring Provider:  Asencion Noble, MD  Encounter Date: 11/07/2014      PT End of Session - 11/07/14 1525    Visit Number 5   Number of Visits 16   Date for PT Re-Evaluation 11/17/14   Authorization Type Medicare/UHC    Authorization Time Period 10/20/14 to 12/20/14   Authorization - Visit Number 5   Authorization - Number of Visits 10   PT Start Time 5462   PT Stop Time 1515   PT Time Calculation (min) 42 min   Activity Tolerance Patient tolerated treatment well   Behavior During Therapy [facility] for tasks assessed/performed      Past Medical History  Diagnosis Date  . Hypertension   . Diabetes mellitus   . High blood cholesterol level   . HX: breast cancer     left  . 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 Mastectomy Left   . Back surgery      lower    There were no vitals filed for this visit.  Visit Diagnosis:  Radicular low back pain  Generalized weakness  Poor posture  Abnormality of gait  Decreased functional activity tolerance  Leg length discrepancy      Subjective Assessment - 11/07/14 1433    Subjective Patient reports she is doing much better, reports that she is really not having any pain except for some burning on the side of her calf    Pertinent History Had back surgery in 2011, however this past February patient started having burning down the back of her leg. Was referred back to her PCP by her back doctor, who trialed her on prednisone that does not seemed to have helped much so far. MD then recommended trailing therapy.  History of prior back surgery.    Currently in Pain? No/denies                          [facility] Adult PT Treatment/Exercise - 11/07/14 0001    Lumbar Exercises: Stretches   Active Hamstring Stretch 2 reps;30 seconds   Active Hamstring Stretch Limitations supine    Piriformis Stretch 2 reps;30 seconds   Piriformis Stretch Limitations seated   Lumbar Exercises: Standing   Heel Raises 15 reps   Heel Raises Limitations floor    Forward Lunge 10 reps   Forward Lunge Limitations 4 inch box    Wall Slides Limitations Step ups onto 4 inch step 1x10 U HHA    Other Standing Lumbar Exercises Sit to stand from mat table with R LE staggered back 1x10; standing hip flexion with L LE only 1x10   Other Standing Lumbar Exercises 3D hip excursions 10 reps; rockerboard 1x20 AP and lateral U HHA    Lumbar Exercises: Seated   Other Seated Lumbar Exercises 3D thoracic excursions 1x15   Lumbar Exercises: Supine   Bridge 15 reps   Bridge Limitations Rt closer to rear   Straight Leg Raise 15 reps   Straight Leg Raises Limitations L LE only    Lumbar Exercises: Sidelying   Clam 10 reps   Clam Limitations biilaterally  PT Education - 11/07/14 1525    Education provided Yes   Education Details education about possible muscle soreness over next couple days after incased amounts of exercise today    Person(s) Educated Patient   Methods Explanation   Comprehension Verbalized understanding          PT Short Term Goals - 11/05/14 1639    PT SHORT TERM GOAL #1   Title Patient will complain of no more than 4/10 pain when she performs functional tasks and activities around her home with no assistive device with no burning sensation or pain down R LE    Status On-going   PT SHORT TERM GOAL #2   Title Patient will demonstrate the ability to consistently perform efficient sit to stand transfer from surfaces of various heights and only occasional cues for sequencing and technique; she will also report that she is no longer using the lift chair at home    Status On-going    PT [place] #3   Title Patient will demonstrate at least 40 degrees bilateral hip ER and at least 38 degrees bilateral hip IR    Status On-going   PT SHORT TERM GOAL #4   Title Patient to be able to verbally state the importance of maintaining correct posture and will consistently demonstrate proper posture through all functional tasks and activities   Status On-going           PT Long Term Goals - 11/05/14 1640    PT LONG TERM GOAL #1   Title Patient will correctly and consistently perform appropriate HEP with updates to exercises by PT staff as needed    PT LONG TERM GOAL #2   Title Patient will demonstrate consistent improvement in leg length discrepany as evidenced by not needing functional correction of leg length discrepancy during skilled PT sessions    PT LONG TERM GOAL #3   Title Patient will be able to sit for at least 90 minutes in order to improve her ability to participate in functional activities such as church related events and driving    PT LONG TERM GOAL #4   Title Patient will be able to stand statically for at least 45 minutes and will be able to ambulate unlimited distances without assistive device in order to enhance her ability to perform functional tasks such as going to the grocery store    PT [place] #5   Title Patient will demonstrate the ability to safely lift grocery bag weighing up to 25 pounds with appropriate form and good safety awareness in order to reduce chances of re-injuring her back                Plan - 11/07/14 1526    Clinical Impression Statement Began and ended session by checking pelvic alignment; patient appeared in alignment both times. Continued functional exercises for alignment and to improve overall mobility today, also continued functional stretches. Increased number of standing exercises today in order to improve overall strength and enhance functional activity tolerance; patient tolerated overall session well but  did state that she had some muscle soreness at end of session.    Pt will benefit from skilled therapeutic intervention in order to improve on the following deficits Abnormal gait;Decreased coordination;Decreased range of motion;Difficulty walking;Impaired flexibility;Improper body mechanics;Decreased endurance;Impaired sensation;Postural dysfunction;Decreased activity tolerance;Decreased balance;Pain;Decreased mobility;Decreased strength;Impaired perceived functional ability   Rehab Potential Good   Clinical Impairments Affecting Rehab Potential chronic back problems    PT Frequency  2x / week   PT Duration 8 weeks   PT Treatment/Interventions ADLs/Self Care Home Management;Gait training;Neuromuscular re-education;Stair training;Biofeedback;Functional mobility training;Patient/family education;Cryotherapy;Therapeutic activities;Therapeutic exercise;Manual techniques;Energy conservation;Moist Heat;DME Instruction;Balance training   PT Next Visit Plan Ensure patient is completing HEP correctly.  Check SI alignment.  Continue with 3D excursions for thoracic and hips.  functional stretching/strengthening; postural theraband exercise. All LE musculature is weak.   PT Home Exercise Plan Bridges and SLR for gluteal and hip flexor strengthening   Consulted and Agree with Plan of Care Patient        Problem List Patient Active Problem List   Diagnosis Date Noted  . 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 Diabetes mellitus due to underlying condition without complications 34/28/7681  . Hypertension, essential, benign 12/24/2013    Deniece Ree PT, DPT [facility] [address] [place], [place], [address] Phone: [phone]   Fax:  [phone]

## 2014-11-12 ENCOUNTER — Encounter (HOSPITAL_COMMUNITY): Payer: Medicare Other | Admitting: Physical Therapy

## 2014-11-12 DIAGNOSIS — H25041 Posterior subcapsular polar age-related cataract, right eye: Secondary | ICD-10-CM | POA: Diagnosis not present

## 2014-11-12 DIAGNOSIS — H2511 Age-related nuclear cataract, right eye: Secondary | ICD-10-CM | POA: Diagnosis not present

## 2014-11-12 DIAGNOSIS — H25042 Posterior subcapsular polar age-related cataract, left eye: Secondary | ICD-10-CM | POA: Diagnosis not present

## 2014-11-12 DIAGNOSIS — H2512 Age-related nuclear cataract, left eye: Secondary | ICD-10-CM | POA: Diagnosis not present

## 2014-11-12 HISTORY — PX: CATARACT EXTRACTION, BILATERAL: SHX1313

## 2014-11-14 ENCOUNTER — Encounter (HOSPITAL_COMMUNITY): Payer: Medicare Other | Admitting: Physical Therapy

## 2014-11-17 ENCOUNTER — Ambulatory Visit (HOSPITAL_COMMUNITY): Payer: Medicare Other | Attending: Internal Medicine | Admitting: Physical Therapy

## 2014-11-17 ENCOUNTER — Telehealth (HOSPITAL_COMMUNITY): Payer: Self-pay | Admitting: Physical Therapy

## 2014-11-17 DIAGNOSIS — R269 Unspecified abnormalities of gait and mobility: Secondary | ICD-10-CM | POA: Insufficient documentation

## 2014-11-17 DIAGNOSIS — M5416 Radiculopathy, lumbar region: Secondary | ICD-10-CM | POA: Insufficient documentation

## 2014-11-17 DIAGNOSIS — R531 Weakness: Secondary | ICD-10-CM | POA: Insufficient documentation

## 2014-11-17 DIAGNOSIS — R6889 Other general symptoms and signs: Secondary | ICD-10-CM | POA: Insufficient documentation

## 2014-11-17 DIAGNOSIS — R293 Abnormal posture: Secondary | ICD-10-CM | POA: Insufficient documentation

## 2014-11-17 DIAGNOSIS — M2170 Unequal limb length (acquired), unspecified site: Secondary | ICD-10-CM | POA: Insufficient documentation

## 2014-11-17 NOTE — Telephone Encounter (Signed)
Patient was a no-show for her 1:45PM appointment today. Called patient, who apologized and stated that she thought she had previously cancelled her appointment in order to give herself time to recover from some eye surgery. Reminded patient of time and date of next session with skilled PT services.   Deniece Ree PT, DPT [phone]

## 2014-11-19 DIAGNOSIS — H2512 Age-related nuclear cataract, left eye: Secondary | ICD-10-CM | POA: Diagnosis not present

## 2014-11-19 DIAGNOSIS — H25042 Posterior subcapsular polar age-related cataract, left eye: Secondary | ICD-10-CM | POA: Diagnosis not present

## 2014-11-20 ENCOUNTER — Encounter (HOSPITAL_COMMUNITY): Payer: Medicare Other | Admitting: Physical Therapy

## 2014-11-24 ENCOUNTER — Ambulatory Visit (HOSPITAL_COMMUNITY): Payer: Medicare Other | Admitting: Physical Therapy

## 2014-11-24 DIAGNOSIS — R269 Unspecified abnormalities of gait and mobility: Secondary | ICD-10-CM

## 2014-11-24 DIAGNOSIS — E785 Hyperlipidemia, unspecified: Secondary | ICD-10-CM | POA: Diagnosis not present

## 2014-11-24 DIAGNOSIS — R531 Weakness: Secondary | ICD-10-CM

## 2014-11-24 DIAGNOSIS — M5416 Radiculopathy, lumbar region: Secondary | ICD-10-CM | POA: Diagnosis not present

## 2014-11-24 DIAGNOSIS — R293 Abnormal posture: Secondary | ICD-10-CM | POA: Diagnosis not present

## 2014-11-24 DIAGNOSIS — M2170 Unequal limb length (acquired), unspecified site: Secondary | ICD-10-CM

## 2014-11-24 DIAGNOSIS — R6889 Other general symptoms and signs: Secondary | ICD-10-CM | POA: Diagnosis not present

## 2014-11-24 DIAGNOSIS — Z79899 Other long term (current) drug therapy: Secondary | ICD-10-CM | POA: Diagnosis not present

## 2014-11-24 DIAGNOSIS — E119 Type 2 diabetes mellitus without complications: Secondary | ICD-10-CM | POA: Diagnosis not present

## 2014-11-24 DIAGNOSIS — M5410 Radiculopathy, site unspecified: Secondary | ICD-10-CM

## 2014-11-24 DIAGNOSIS — I10 Essential (primary) hypertension: Secondary | ICD-10-CM | POA: Diagnosis not present

## 2014-11-24 NOTE — Therapy (Signed)
[facility] [address] [place], [place], [address] Phone: [phone]   Fax:  [phone]  Physical Therapy Treatment (Re-Assessment)  Patient Details  Name: Christine Sutton MRN: [id number] Date of Birth: 01/25/1930 Referring Provider:  Asencion Noble, MD  Encounter Date: 11/24/2014      PT End of Session - 11/24/14 1619    Visit Number 6   Number of Visits 16   Date for PT Re-Evaluation 12/15/14   Authorization Type Medicare/UHC    Authorization Time Period 10/20/14 to 12/20/14. G-code done 6th session.    Authorization - Visit Number 6   Authorization - Number of Visits 10   PT Start Time 0321   PT Stop Time 1516   PT Time Calculation (min) 40 min   Activity Tolerance Patient tolerated treatment well   Behavior During Therapy WFL for tasks assessed/performed      Past Medical History  Diagnosis Date  . Hypertension   . Diabetes mellitus   . High blood cholesterol level   . HX: breast cancer     left  . 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 Mastectomy Left   . Back surgery      lower    There were no vitals filed for this visit.  Visit Diagnosis:  Radicular low back pain  Generalized weakness  Poor posture  Abnormality of gait  Decreased functional activity tolerance  Leg length discrepancy      Subjective Assessment - 11/24/14 1440    Subjective Patient reports she is doing well, had eye surgery and misses her glasses actually; states that she has not spent a whole lot of time doing HEP recently because she's been concerned with her vision after the surgery   Pertinent History Had back surgery in 2011, however this past February patient started having burning down the back of her leg. Was referred back to her PCP by her back doctor, who trialed her on prednisone that does not seemed to have helped much so far. MD then recommended  trailing therapy.  History of prior back surgery.    How long can you sit comfortably? 6/13- no problems in standard chair unless sitting for more than 60 minutes    How long can you stand comfortably? 6/13- 15 minutes    How long can you walk comfortably? 6/13- 20 minutes is still main limitation    Currently in Pain? No/denies            [facility] PT Assessment - 11/24/14 0001    Observation/Other Assessments   Focus on Therapeutic Outcomes (FOTO)  40% limited    AROM   Right Hip External Rotation  42   Right Hip Internal Rotation  31   Left Hip External Rotation  45   Left Hip Internal Rotation  33   Lumbar Flexion 75   Lumbar Extension 17   Lumbar - Right Side Bend 21   Lumbar - Left Side Bend 20   Strength   Overall Strength Comments patient states she is in a lot of pain when she first gets on her back, but then the pain calms down   core strength approximately 2+/5 to 3-/5   Right Hip Flexion 4-/5   Right Hip Extension 2+/5   Right Hip ABduction 3-/5   Left Hip Flexion 3+/5   Left Hip Extension 2+/5   Left  Hip ABduction 3-/5   Right Knee Flexion 4-/5   Right Knee Extension 4/5   Left Knee Flexion 4-/5   Left Knee Extension 4/5   Right Ankle Dorsiflexion 4/5   Left Ankle Dorsiflexion 4+/5                     [facility] Adult PT Treatment/Exercise - 11/24/14 0001    Lumbar Exercises: Stretches   Active Hamstring Stretch 3 reps;30 seconds   Active Hamstring Stretch Limitations 12 inch box    Passive Hamstring Stretch 3 reps;30 seconds   Passive Hamstring Stretch Limitations slantboard                PT Education - 11/24/14 1619    Education provided Yes   Education Details plan of care moving forward, progress with skilled PT services    Person(s) Educated Patient   Methods Explanation   Comprehension Verbalized understanding          PT Short Term Goals - 11/24/14 1507    PT SHORT TERM GOAL #1   Title Patient will complain of no more than  4/10 pain when she performs functional tasks and activities around her home with no assistive device with no burning sensation or pain down R LE    Baseline 6/13- pain has not been exceeding 4/10 during household chores recently, but she still has burning sensation in R LE    Time 4   Period Weeks   Status Partially Met   PT [place] #2   Title Patient will demonstrate the ability to consistently perform efficient sit to stand transfer from surfaces of various heights and only occasional cues for sequencing and technique; she will also report that she is no longer using the lift chair at home    Baseline 6/13- able to stand up more easily but still having a catching pain in her back, not using lift chair anymore   Time 4   Period Weeks   Status Achieved   PT SHORT TERM GOAL #3   Title Patient will demonstrate at least 40 degrees bilateral hip ER and at least 38 degrees bilateral hip IR    Baseline 6/13- still working on bilateral hip IR    Time 4   Period Weeks   Status Partially Met   PT SHORT TERM GOAL #4   Title Patient to be able to verbally state the importance of maintaining correct posture and will consistently demonstrate proper posture through all functional tasks and activities   Baseline 6/13- still working on posture but awareness is increasing    Time 4   Period Weeks   Status On-going           PT Long Term Goals - 11/24/14 1511    PT LONG TERM GOAL #1   Title Patient will correctly and consistently perform appropriate HEP with updates to exercises by PT staff as needed    Time 8   Period Weeks   Status On-going   PT LONG TERM GOAL #2   Title Patient will demonstrate consistent improvement in leg length discrepany as evidenced by not needing functional correction of leg length discrepancy during skilled PT sessions for two consecutive weeks    Time 8   Period Weeks   Status Revised   PT LONG TERM GOAL #3   Title Patient will be able to sit for at least 90  minutes in order to improve her ability to participate in functional  activities such as church related events and driving    Time 8   Period Weeks   Status On-going   PT LONG TERM GOAL #4   Title Patient will be able to stand statically for at least 45 minutes and will be able to ambulate unlimited distances without assistive device in order to enhance her ability to perform functional tasks such as going to the grocery store    Time 8   Period Weeks   Status On-going   PT LONG TERM GOAL #5   Title Patient will demonstrate the ability to safely lift grocery bag weighing up to 25 pounds with appropriate form and good safety awareness in order to reduce chances of re-injuring her back    Time 8   Period Weeks   Status On-going               Plan - 12-02-14 1620    Clinical Impression Statement Re-assessment performed today. Patient is definitely improving but does continue to present with lumbar stiffness and bilateral hip IR stiffness, weakness in bilateral lower extremities and proximal muscles, postural and gait impairments, reduced actviity tolerance, reduced tolerance to extended periods of standing and walking, and reduced ability to participate in functional events within her community. Patient states she is no lnoger using her lift chair at home and her sit to stand transfers have much improved, although she feels she is very limited in community events due to the pain she has with extended standing and gait. Patient will benefit from a continuation of skilled PT services, at 2x/week for 4 more weeks, in order to continue to address these deficits and assist her in reaching an optimal level of function.    Pt will benefit from skilled therapeutic intervention in order to improve on the following deficits Abnormal gait;Decreased coordination;Decreased range of motion;Difficulty walking;Impaired flexibility;Improper body mechanics;Decreased endurance;Impaired sensation;Postural  dysfunction;Decreased activity tolerance;Decreased balance;Pain;Decreased mobility;Decreased strength;Impaired perceived functional ability   Rehab Potential Good   Clinical Impairments Affecting Rehab Potential chronic back problems    PT Frequency 2x / week   PT Duration 4 weeks   PT Treatment/Interventions ADLs/Self Care Home Management;Gait training;Neuromuscular re-education;Stair training;Biofeedback;Functional mobility training;Patient/family education;Cryotherapy;Therapeutic activities;Therapeutic exercise;Manual techniques;Energy conservation;Moist Heat;DME Instruction;Balance training   PT Next Visit Plan Ensure patient is completing HEP correctly.  Check SI alignment.  Continue with 3D excursions for thoracic and hips.  functional stretching/strengthening; postural theraband exercise. All LE musculature is weak.   PT Home Exercise Plan Bridges and SLR for gluteal and hip flexor strengthening   Consulted and Agree with Plan of Care Patient          G-Codes - 2014/12/02 1625    Functional Assessment Tool Used FOTO    Functional Limitation Mobility: Walking and moving around   Mobility: Walking and Moving Around Current Status [phone]) At least 40 percent but less than 60 percent impaired, limited or restricted   Mobility: Walking and Moving Around Goal Status [phone]) At least 20 percent but less than 40 percent impaired, limited or restricted      Problem List Patient Active Problem List   Diagnosis Date Noted  . 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 Diabetes mellitus due to underlying condition without complications 56/97/9480  . Hypertension, essential, benign 12/24/2013    Deniece Ree PT, DPT [facility] [address] [place], [place], [address] Phone: [phone]   Fax:  [phone]

## 2014-11-26 ENCOUNTER — Ambulatory Visit (HOSPITAL_COMMUNITY): Payer: Medicare Other | Admitting: Physical Therapy

## 2014-11-26 DIAGNOSIS — R293 Abnormal posture: Secondary | ICD-10-CM | POA: Diagnosis not present

## 2014-11-26 DIAGNOSIS — R531 Weakness: Secondary | ICD-10-CM

## 2014-11-26 DIAGNOSIS — M5410 Radiculopathy, site unspecified: Secondary | ICD-10-CM

## 2014-11-26 DIAGNOSIS — R269 Unspecified abnormalities of gait and mobility: Secondary | ICD-10-CM | POA: Diagnosis not present

## 2014-11-26 DIAGNOSIS — M2170 Unequal limb length (acquired), unspecified site: Secondary | ICD-10-CM | POA: Diagnosis not present

## 2014-11-26 DIAGNOSIS — R6889 Other general symptoms and signs: Secondary | ICD-10-CM

## 2014-11-26 DIAGNOSIS — M5416 Radiculopathy, lumbar region: Secondary | ICD-10-CM | POA: Diagnosis not present

## 2014-11-26 NOTE — Therapy (Signed)
[place], [place], [address] Phone: [phone]   Fax:  [phone]  Physical Therapy Treatment  Patient Details  Name: Christine Sutton MRN: [id number] Date of Birth: 1930-03-18 Referring Provider:  Asencion Noble, MD  Encounter Date: 11/26/2014      PT End of Session - 11/26/14 1200    Visit Number 7   Number of Visits 16   Date for PT Re-Evaluation 12/15/14   Authorization Type Medicare/UHC    Authorization Time Period 10/20/14 to 12/20/14. G-code done 6th session.    Authorization - Visit Number 7   Authorization - Number of Visits 10   PT Start Time 0804   PT Stop Time 9470   PT Time Calculation (min) 43 min   Activity Tolerance Patient tolerated treatment well      Past Medical History  Diagnosis Date  . Hypertension   . Diabetes mellitus   . High blood cholesterol level   . HX: breast cancer     left  . 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 Mastectomy Left   . Back surgery      lower    There were no vitals filed for this visit.  Visit Diagnosis:  Radicular low back pain  Generalized weakness  Poor posture  Decreased functional activity tolerance      Subjective Assessment - 11/26/14 1158    Subjective Pt reports that her legs haven't been working well lately. She reports increased soreness today with pain in her low back, buttocks, and lower leg. She has been experiencing numbness and warmth in her lower leg. She has noticed improvements in her walking, and is able to walk more quickly with less pain.    Currently in Pain? Yes   Pain Score 6    Pain Location Back   Pain Orientation Lower   Pain Descriptors / Indicators Aching              [facility] Adult PT Treatment/Exercise - 11/26/14 0001    Lumbar Exercises: Stretches   Prone on Elbows Stretch 60 seconds   Lumbar Exercises: Aerobic   Stationary  Bike NuStep level 2 x 8 minutes   Lumbar Exercises: Standing   Scapular Retraction Strengthening;10 reps   Theraband Level (Scapular Retraction) Level 3 (Green)   Row Strengthening;10 reps   Theraband Level (Row) Level 3 (Green)   Shoulder Extension Strengthening;10 reps   Theraband Level (Shoulder Extension) Level 3 (Green)   Other Standing Lumbar Exercises 3D hip excursions x 10 reps   Lumbar Exercises: Seated   Other Seated Lumbar Exercises 3D thoracic excursions 1x15   Lumbar Exercises: Supine   Bridge 10 reps   Other Supine Lumbar Exercises single leg bridge on Rt x 5   Lumbar Exercises: Sidelying   Hip Abduction 10 reps   Hip Abduction Weights (lbs) bilaterally   Lumbar Exercises: Prone   Straight Leg Raise 10 reps   Straight Leg Raises Limitations bilaterally   Other Prone Lumbar Exercises heel squeeze x 10 reps   Manual Therapy   Manual Therapy Other (comment)   Manual therapy comments muscle energy technique for Rt anterior rotation                PT Education - 11/26/14 1159    Education provided Yes   Education Details Educated on importance of maintaining proper posture  Person(s) Educated Patient   Methods Explanation   Comprehension Verbalized understanding          PT Short Term Goals - 11/24/14 1507    PT SHORT TERM GOAL #1   Title Patient will complain of no more than 4/10 pain when she performs functional tasks and activities around her home with no assistive device with no burning sensation or pain down R LE    Baseline 6/13- pain has not been exceeding 4/10 during household chores recently, but she still has burning sensation in R LE    Time 4   Period Weeks   Status Partially Met   PT SHORT TERM GOAL #2   Title Patient will demonstrate the ability to consistently perform efficient sit to stand transfer from surfaces of various heights and only occasional cues for sequencing and technique; she will also report that she is no longer using the  lift chair at home    Baseline 6/13- able to stand up more easily but still having a catching pain in her back, not using lift chair anymore   Time 4   Period Weeks   Status Achieved   PT SHORT TERM GOAL #3   Title Patient will demonstrate at least 40 degrees bilateral hip ER and at least 38 degrees bilateral hip IR    Baseline 6/13- still working on bilateral hip IR    Time 4   Period Weeks   Status Partially Met   PT [place] #4   Title Patient to be able to verbally state the importance of maintaining correct posture and will consistently demonstrate proper posture through all functional tasks and activities   Baseline 6/13- still working on posture but awareness is increasing    Time 4   Period Weeks   Status On-going           PT Long Term Goals - 11/24/14 1511    PT LONG TERM GOAL #1   Title Patient will correctly and consistently perform appropriate HEP with updates to exercises by PT staff as needed    Time 8   Period Weeks   Status On-going   PT LONG TERM GOAL #2   Title Patient will demonstrate consistent improvement in leg length discrepany as evidenced by not needing functional correction of leg length discrepancy during skilled PT sessions for two consecutive weeks    Time 8   Period Weeks   Status Revised   PT LONG TERM GOAL #3   Title Patient will be able to sit for at least 90 minutes in order to improve her ability to participate in functional activities such as church related events and driving    Time 8   Period Weeks   Status On-going   PT LONG TERM GOAL #4   Title Patient will be able to stand statically for at least 45 minutes and will be able to ambulate unlimited distances without assistive device in order to enhance her ability to perform functional tasks such as going to the grocery store    Time 8   Period Weeks   Status On-going   PT LONG TERM GOAL #5   Title Patient will demonstrate the ability to safely lift grocery bag weighing up to  25 pounds with appropriate form and good safety awareness in order to reduce chances of re-injuring her back    Time 8   Period Weeks   Status On-going  Plan - 11/26/14 1201    Clinical Impression Statement Pt required verbal and tactile cueing during postural strengthening exercises to ensure proper form and proper posture during exercises. She continues to demonstrate stiffness through her lumbar spine as a result of impaired posture.    PT Next Visit Plan Continue with postural strengthening and increasing functional activity tolerance.         Problem List Patient Active Problem List   Diagnosis Date Noted  . 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 Diabetes mellitus due to underlying condition without complications 26/71/2458  . Hypertension, essential, benign 12/24/2013    Rayetta Humphrey, PT CLT [phone]  11/26/2014, 12:14 PM  [facility] [address] [place], [place], [address] Phone: [phone]   Fax:  [phone]

## 2014-11-27 ENCOUNTER — Encounter (HOSPITAL_COMMUNITY): Payer: Medicare Other | Admitting: Physical Therapy

## 2014-12-01 ENCOUNTER — Ambulatory Visit (HOSPITAL_COMMUNITY): Payer: Medicare Other | Admitting: Physical Therapy

## 2014-12-01 DIAGNOSIS — I10 Essential (primary) hypertension: Secondary | ICD-10-CM | POA: Diagnosis not present

## 2014-12-01 DIAGNOSIS — R531 Weakness: Secondary | ICD-10-CM

## 2014-12-01 DIAGNOSIS — M5416 Radiculopathy, lumbar region: Secondary | ICD-10-CM | POA: Diagnosis not present

## 2014-12-01 DIAGNOSIS — Z23 Encounter for immunization: Secondary | ICD-10-CM | POA: Diagnosis not present

## 2014-12-01 DIAGNOSIS — M2170 Unequal limb length (acquired), unspecified site: Secondary | ICD-10-CM

## 2014-12-01 DIAGNOSIS — R269 Unspecified abnormalities of gait and mobility: Secondary | ICD-10-CM | POA: Diagnosis not present

## 2014-12-01 DIAGNOSIS — R293 Abnormal posture: Secondary | ICD-10-CM

## 2014-12-01 DIAGNOSIS — R6889 Other general symptoms and signs: Secondary | ICD-10-CM

## 2014-12-01 DIAGNOSIS — E1129 Type 2 diabetes mellitus with other diabetic kidney complication: Secondary | ICD-10-CM | POA: Diagnosis not present

## 2014-12-01 DIAGNOSIS — E785 Hyperlipidemia, unspecified: Secondary | ICD-10-CM | POA: Diagnosis not present

## 2014-12-01 DIAGNOSIS — Z853 Personal history of malignant neoplasm of breast: Secondary | ICD-10-CM | POA: Diagnosis not present

## 2014-12-01 DIAGNOSIS — M5410 Radiculopathy, site unspecified: Secondary | ICD-10-CM

## 2014-12-01 NOTE — Therapy (Signed)
[place], [place], [address] Phone: [phone]   Fax:  [phone]  Physical Therapy Treatment  Patient Details  Name: Christine Sutton MRN: [id number] Date of Birth: 10/20/1929 Referring Provider:  Asencion Noble, MD  Encounter Date: 12/01/2014      PT End of Session - 12/01/14 1516    Visit Number 8   Number of Visits 16   Date for PT Re-Evaluation 12/15/14   Authorization Type Medicare/UHC    Authorization Time Period 10/20/14 to 12/20/14. G-code done 6th session.    Authorization - Visit Number 8   Authorization - Number of Visits 10   PT Start Time 1430   PT Stop Time 1512   PT Time Calculation (min) 42 min   Activity Tolerance Patient tolerated treatment well   Behavior During Therapy WFL for tasks assessed/performed      Past Medical History  Diagnosis Date  . Hypertension   . Diabetes mellitus   . High blood cholesterol level   . HX: breast cancer     left  . 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 Mastectomy Left   . Back surgery      lower    There were no vitals filed for this visit.  Visit Diagnosis:  Radicular low back pain  Generalized weakness  Poor posture  Decreased functional activity tolerance  Abnormality of gait  Leg length discrepancy                       [facility] Adult PT Treatment/Exercise - 12/01/14 0001    Lumbar Exercises: Stretches   Active Hamstring Stretch 3 reps;30 seconds   Active Hamstring Stretch Limitations stairs    Passive Hamstring Stretch 3 reps;30 seconds   Passive Hamstring Stretch Limitations slantboard   Lumbar Exercises: Standing   Heel Raises 20 reps   Heel Raises Limitations floor    Forward Lunge 10 reps   Forward Lunge Limitations 4 inch box    Wall Slides Limitations Step ups onto 4 inch step 1x10 U HHA    Scapular Retraction Strengthening;10  reps   Theraband Level (Scapular Retraction) Level 3 (Green)   Shoulder Extension Strengthening;10 reps   Theraband Level (Shoulder Extension) Level 3 (Green)   Other Standing Lumbar Exercises Sit to stand with L LE staggered back, Standing hip flexion R LE 1x10   Other Standing Lumbar Exercises 3D hip excursions x 15 reps; standing hip ABD 1x10 each LE    Lumbar Exercises: Supine   Bridge 15 reps   Bridge Limitations Lt closer to rear    Straight Leg Raise 15 reps   Straight Leg Raises Limitations R LE only    Manual Therapy   Manual Therapy Other (comment)   Manual therapy comments muscle energy technique for Lt anterior rotation             Balance Exercises - 12/01/14 1507    Balance Exercises: Standing   Tandem Stance --   Rockerboard Other (comment)  x20AP and x20 lateral B HHA            PT Education - 12/01/14 1619    Education provided Yes   Education Details postural awareness    Person(s) Educated Patient   Methods Explanation   Comprehension Verbalized understanding          PT  Short Term Goals - 11/24/14 1507    PT SHORT TERM GOAL #1   Title Patient will complain of no more than 4/10 pain when she performs functional tasks and activities around her home with no assistive device with no burning sensation or pain down R LE    Baseline 6/13- pain has not been exceeding 4/10 during household chores recently, but she still has burning sensation in R LE    Time 4   Period Weeks   Status Partially Met   PT SHORT TERM GOAL #2   Title Patient will demonstrate the ability to consistently perform efficient sit to stand transfer from surfaces of various heights and only occasional cues for sequencing and technique; she will also report that she is no longer using the lift chair at home    Baseline 6/13- able to stand up more easily but still having a catching pain in her back, not using lift chair anymore   Time 4   Period Weeks   Status Achieved   PT SHORT  TERM GOAL #3   Title Patient will demonstrate at least 40 degrees bilateral hip ER and at least 38 degrees bilateral hip IR    Baseline 6/13- still working on bilateral hip IR    Time 4   Period Weeks   Status Partially Met   PT [place] #4   Title Patient to be able to verbally state the importance of maintaining correct posture and will consistently demonstrate proper posture through all functional tasks and activities   Baseline 6/13- still working on posture but awareness is increasing    Time 4   Period Weeks   Status On-going           PT Long Term Goals - 11/24/14 1511    PT LONG TERM GOAL #1   Title Patient will correctly and consistently perform appropriate HEP with updates to exercises by PT staff as needed    Time 8   Period Weeks   Status On-going   PT LONG TERM GOAL #2   Title Patient will demonstrate consistent improvement in leg length discrepany as evidenced by not needing functional correction of leg length discrepancy during skilled PT sessions for two consecutive weeks    Time 8   Period Weeks   Status Revised   PT LONG TERM GOAL #3   Title Patient will be able to sit for at least 90 minutes in order to improve her ability to participate in functional activities such as church related events and driving    Time 8   Period Weeks   Status On-going   PT LONG TERM GOAL #4   Title Patient will be able to stand statically for at least 45 minutes and will be able to ambulate unlimited distances without assistive device in order to enhance her ability to perform functional tasks such as going to the grocery store    Time 8   Period Weeks   Status On-going   PT LONG TERM GOAL #5   Title Patient will demonstrate the ability to safely lift grocery bag weighing up to 25 pounds with appropriate form and good safety awareness in order to reduce chances of re-injuring her back    Time 8   Period Weeks   Status On-going               Plan - 12/01/14  1620    Clinical Impression Statement Continued functional stretches and exercises today after correction  of anterior pelvic tilt; patient appeared to tolerate exercises well and states that she would like to get even stronger. Patient does need occasional cuing throughout exercise for proper form but is improving in this aspect. Motivation remains very high, especially as patient cotinues to reap the benefits of regularly participating in skilled PT services.    Pt will benefit from skilled therapeutic intervention in order to improve on the following deficits Abnormal gait;Decreased coordination;Decreased range of motion;Difficulty walking;Impaired flexibility;Improper body mechanics;Decreased endurance;Impaired sensation;Postural dysfunction;Decreased activity tolerance;Decreased balance;Pain;Decreased mobility;Decreased strength;Impaired perceived functional ability   Rehab Potential Good   Clinical Impairments Affecting Rehab Potential chronic back problems    PT Frequency 2x / week   PT Duration 4 weeks   PT Treatment/Interventions ADLs/Self Care Home Management;Gait training;Neuromuscular re-education;Stair training;Biofeedback;Functional mobility training;Patient/family education;Cryotherapy;Therapeutic activities;Therapeutic exercise;Manual techniques;Energy conservation;Moist Heat;DME Instruction;Balance training   PT Next Visit Plan Continue with postural strengthening and increasing functional activity tolerance. Correct LLD PRN. Introduce more core strength.    PT Home Exercise Plan Bridges and SLR for gluteal and hip flexor strengthening   Consulted and Agree with Plan of Care Patient        Problem List Patient Active Problem List   Diagnosis Date Noted  . 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 Diabetes mellitus due to underlying condition without complications 26/37/8588  . Hypertension, essential, benign 12/24/2013    Deniece Ree PT,  DPT [facility] [address] [place], [place], [address] Phone: [phone]   Fax:  [phone]

## 2014-12-04 ENCOUNTER — Ambulatory Visit (HOSPITAL_COMMUNITY): Payer: Medicare Other | Admitting: Physical Therapy

## 2014-12-04 DIAGNOSIS — R269 Unspecified abnormalities of gait and mobility: Secondary | ICD-10-CM | POA: Diagnosis not present

## 2014-12-04 DIAGNOSIS — R293 Abnormal posture: Secondary | ICD-10-CM

## 2014-12-04 DIAGNOSIS — R6889 Other general symptoms and signs: Secondary | ICD-10-CM

## 2014-12-04 DIAGNOSIS — R531 Weakness: Secondary | ICD-10-CM

## 2014-12-04 DIAGNOSIS — M5410 Radiculopathy, site unspecified: Secondary | ICD-10-CM

## 2014-12-04 DIAGNOSIS — M2170 Unequal limb length (acquired), unspecified site: Secondary | ICD-10-CM

## 2014-12-04 DIAGNOSIS — M5416 Radiculopathy, lumbar region: Secondary | ICD-10-CM | POA: Diagnosis not present

## 2014-12-04 NOTE — Therapy (Signed)
[place], [place], [address] Phone: [phone]   Fax:  [phone]  Physical Therapy Treatment  Patient Details  Name: Christine Sutton MRN: [id number] Date of Birth: 12-19-1929 Referring Provider:  Asencion Noble, MD  Encounter Date: 12/04/2014      PT End of Session - 12/04/14 1021    Visit Number 9   Number of Visits 16   Date for PT Re-Evaluation 12/15/14   Authorization Type Medicare/UHC    Authorization Time Period 10/20/14 to 12/20/14. G-code done 6th session.    Authorization - Visit Number 9   Authorization - Number of Visits 16   PT Start Time 0932   PT Stop Time 1018   PT Time Calculation (min) 46 min   Activity Tolerance Patient tolerated treatment well   Behavior During Therapy WFL for tasks assessed/performed      Past Medical History  Diagnosis Date  . Hypertension   . Diabetes mellitus   . High blood cholesterol level   . HX: breast cancer     left  . 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 Mastectomy Left   . Back surgery      lower    There were no vitals filed for this visit.  Visit Diagnosis:  Radicular low back pain  Generalized weakness  Poor posture  Decreased functional activity tolerance  Abnormality of gait  Leg length discrepancy      Subjective Assessment - 12/04/14 1020    Subjective Pt reports she is currently without pain.  States she only hurts if she does alot of actvity.  States she walked for 20 minutes yesterday and began hurting.   Currently in Pain? No/denies                         [facility] Adult PT Treatment/Exercise - 12/04/14 0932    Lumbar Exercises: Stretches   Active Hamstring Stretch 3 reps;30 seconds   Active Hamstring Stretch Limitations 12" step   Passive Hamstring Stretch 3 reps;30 seconds   Passive Hamstring Stretch Limitations slantboard   Lumbar Exercises: Standing   Heel Raises 20 reps   Heel Raises Limitations no UE's   Forward Lunge 15 reps   Forward Lunge Limitations 4 inch box    Scapular Retraction Strengthening;10 reps   Theraband Level (Scapular Retraction) Level 3 (Green)   Row Strengthening;10 reps   Theraband Level (Row) Level 3 (Green)   Shoulder Extension Strengthening;10 reps   Theraband Level (Shoulder Extension) Level 3 (Green)   Other Standing Lumbar Exercises single leg reach 2" box 5X each   Other Standing Lumbar Exercises 3D hip excursions x 15 reps; standing hip ABD 1x10 each LE    Lumbar Exercises: Supine   Bridge 15 reps   Bridge Limitations Rt LE only   Straight Leg Raise 15 reps   Straight Leg Raises Limitations bilaterally   Other Supine Lumbar Exercises single leg bridge on Rt x 10   Lumbar Exercises: Sidelying   Clam 10 reps   Clam Limitations biilaterally    Hip Abduction 10 reps   Hip Abduction Weights (lbs) bilaterally   Lumbar Exercises: Prone   Straight Leg Raise 10 reps   Straight Leg Raises Limitations bilaterally                PT Education -  12/04/14 1025    Education provided Yes   Education Details updated HEP with theraband and written instructions   Person(s) Educated Patient   Methods Explanation;Handout   Comprehension Verbalized understanding;Returned demonstration          PT Short Term Goals - 11/24/14 1507    PT SHORT TERM GOAL #1   Title Patient will complain of no more than 4/10 pain when she performs functional tasks and activities around her home with no assistive device with no burning sensation or pain down R LE    Baseline 6/13- pain has not been exceeding 4/10 during household chores recently, but she still has burning sensation in R LE    Time 4   Period Weeks   Status Partially Met   PT [place] #2   Title Patient will demonstrate the ability to consistently perform efficient sit to stand transfer from surfaces of various heights  and only occasional cues for sequencing and technique; she will also report that she is no longer using the lift chair at home    Baseline 6/13- able to stand up more easily but still having a catching pain in her back, not using lift chair anymore   Time 4   Period Weeks   Status Achieved   PT SHORT TERM GOAL #3   Title Patient will demonstrate at least 40 degrees bilateral hip ER and at least 38 degrees bilateral hip IR    Baseline 6/13- still working on bilateral hip IR    Time 4   Period Weeks   Status Partially Met   PT [place] #4   Title Patient to be able to verbally state the importance of maintaining correct posture and will consistently demonstrate proper posture through all functional tasks and activities   Baseline 6/13- still working on posture but awareness is increasing    Time 4   Period Weeks   Status On-going           PT Long Term Goals - 11/24/14 1511    PT LONG TERM GOAL #1   Title Patient will correctly and consistently perform appropriate HEP with updates to exercises by PT staff as needed    Time 8   Period Weeks   Status On-going   PT LONG TERM GOAL #2   Title Patient will demonstrate consistent improvement in leg length discrepany as evidenced by not needing functional correction of leg length discrepancy during skilled PT sessions for two consecutive weeks    Time 8   Period Weeks   Status Revised   PT LONG TERM GOAL #3   Title Patient will be able to sit for at least 90 minutes in order to improve her ability to participate in functional activities such as church related events and driving    Time 8   Period Weeks   Status On-going   PT LONG TERM GOAL #4   Title Patient will be able to stand statically for at least 45 minutes and will be able to ambulate unlimited distances without assistive device in order to enhance her ability to perform functional tasks such as going to the grocery store    Time 8   Period Weeks   Status On-going    PT LONG TERM GOAL #5   Title Patient will demonstrate the ability to safely lift grocery bag weighing up to 25 pounds with appropriate form and good safety awareness in order to reduce chances of re-injuring her back  Time 8   Period Weeks   Status On-going               Plan - 12/04/14 1022    Clinical Impression Statement continued focus on increasing functional strengthening and stretches.  Pt in good alignment today without need for MET.  PRogressed with single leg reach balance to target glute musculature and given theraband/written instructions for HEP.  PT without pain at EOS.  Progressing well overall.   Clinical Impairments Affecting Rehab Potential chronic back problems    PT Next Visit Plan Continue to progress increasing Gluteal and hip flexor strength.  Add high march holds and increase stability exericses.  continue to check SI alignment and correct if needed.     Consulted and Agree with Plan of Care Patient        Problem List Patient Active Problem List   Diagnosis Date Noted  . 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 Diabetes mellitus due to underlying condition without complications 31/54/0086  . Hypertension, essential, benign 12/24/2013    Teena Irani, PTA/CLT [phone]  12/04/2014, 10:26 AM  [facility] [place], [place], [address] Phone: [phone]   Fax:  [phone]

## 2014-12-08 ENCOUNTER — Ambulatory Visit (HOSPITAL_COMMUNITY): Payer: Medicare Other | Admitting: Physical Therapy

## 2014-12-08 DIAGNOSIS — R269 Unspecified abnormalities of gait and mobility: Secondary | ICD-10-CM

## 2014-12-08 DIAGNOSIS — R293 Abnormal posture: Secondary | ICD-10-CM | POA: Diagnosis not present

## 2014-12-08 DIAGNOSIS — M5410 Radiculopathy, site unspecified: Secondary | ICD-10-CM

## 2014-12-08 DIAGNOSIS — M5416 Radiculopathy, lumbar region: Secondary | ICD-10-CM | POA: Diagnosis not present

## 2014-12-08 DIAGNOSIS — M2170 Unequal limb length (acquired), unspecified site: Secondary | ICD-10-CM

## 2014-12-08 DIAGNOSIS — R6889 Other general symptoms and signs: Secondary | ICD-10-CM

## 2014-12-08 DIAGNOSIS — R531 Weakness: Secondary | ICD-10-CM | POA: Diagnosis not present

## 2014-12-08 NOTE — Therapy (Signed)
[facility] [address] ,  [place], [address] Phone: [phone]   Fax:  [phone]  Physical Therapy Treatment  Patient Details  Name: Christine Sutton MRN: [id number] Date of Birth: 06/07/1930 Referring Provider:  Fagan, Roy, MD  Encounter Date: 12/08/2014      PT End of Session - 12/08/14 1811    Visit Number 10   Number of Visits 16   Date for PT Re-Evaluation 12/15/14   Authorization Type Medicare/UHC    Authorization Time Period 10/20/14 to 12/20/14. G-code done 6th session.    Authorization - Visit Number 10   Authorization - Number of Visits 16   PT Start Time 1435   PT Stop Time 1530   PT Time Calculation (min) 55 min   Activity Tolerance Patient tolerated treatment well   Behavior During Therapy WFL for tasks assessed/performed      Past Medical History  Diagnosis Date  . Hypertension   . Diabetes mellitus   . High blood cholesterol level   . HX: breast cancer     left  . 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 Mastectomy Left   . Back surgery      lower    There were no vitals filed for this visit.  Visit Diagnosis:  Radicular low back pain  Generalized weakness  Poor posture  Decreased functional activity tolerance  Abnormality of gait  Leg length discrepancy      Subjective Assessment - 12/08/14 1809    Subjective Pt states she can tell an improvement.  States she was able to attend bible study and sat for 1.5 hours wtihout pain.  STates she was also able to return to church this past Sunday. Currentlty without pain.   Currently in Pain? No/denies                         [facility] Adult PT Treatment/Exercise - 12/08/14 1810    Lumbar Exercises: Stretches   Active Hamstring Stretch 3 reps;30 seconds   Active Hamstring Stretch Limitations 12" step   Passive Hamstring Stretch 3 reps;30 seconds   Passive Hamstring Stretch Limitations slantboard   Piriformis Stretch 2 reps;30 seconds   Piriformis Stretch Limitations seated   Lumbar Exercises: Aerobic   Stationary Bike NuStep level 2 x 8 minutes   Lumbar Exercises: Standing   Heel Raises 20 reps   Heel Raises Limitations no UE's   Forward Lunge 15 reps   Forward Lunge Limitations 4 inch box    Scapular Retraction Strengthening;10 reps   Theraband Level (Scapular Retraction) Level 3 (Green)   Row Strengthening;10 reps   Theraband Level (Row) Level 3 (Green)   Shoulder Extension Strengthening;10 reps   Theraband Level (Shoulder Extension) Level 3 (Green)   Other Standing Lumbar Exercises single leg reach 2" box 10X each   Lumbar Exercises: Supine   Bridge 15 reps   Bridge Limitations Rt LE only   Straight Leg Raise 15 reps   Straight Leg Raises Limitations bilaterally   Lumbar Exercises: Sidelying   Clam 15 reps   Clam Limitations biilaterally    Hip Abduction 15 reps   Hip Abduction Weights (lbs) bilaterally   Lumbar Exercises: Prone   Straight Leg Raise 15 reps   Straight Leg Raises Limitations bilaterally   Other Prone Lumbar Exercises heel squeeze x 10 reps     Other Prone Lumbar Exercises hamstring curls 10 reps                  PT Short Term Goals - 11/24/14 1507    PT SHORT TERM GOAL #1   Title Patient will complain of no more than 4/10 pain when she performs functional tasks and activities around her home with no assistive device with no burning sensation or pain down R LE    Baseline 6/13- pain has not been exceeding 4/10 during household chores recently, but she still has burning sensation in R LE    Time 4   Period Weeks   Status Partially Met   PT SHORT TERM GOAL #2   Title Patient will demonstrate the ability to consistently perform efficient sit to stand transfer from surfaces of various heights and only occasional cues for sequencing and technique; she will also report that she is no longer using  the lift chair at home    Baseline 6/13- able to stand up more easily but still having a catching pain in her back, not using lift chair anymore   Time 4   Period Weeks   Status Achieved   PT SHORT TERM GOAL #3   Title Patient will demonstrate at least 40 degrees bilateral hip ER and at least 38 degrees bilateral hip IR    Baseline 6/13- still working on bilateral hip IR    Time 4   Period Weeks   Status Partially Met   PT [place] #4   Title Patient to be able to verbally state the importance of maintaining correct posture and will consistently demonstrate proper posture through all functional tasks and activities   Baseline 6/13- still working on posture but awareness is increasing    Time 4   Period Weeks   Status On-going           PT Long Term Goals - 11/24/14 1511    PT LONG TERM GOAL #1   Title Patient will correctly and consistently perform appropriate HEP with updates to exercises by PT staff as needed    Time 8   Period Weeks   Status On-going   PT LONG TERM GOAL #2   Title Patient will demonstrate consistent improvement in leg length discrepany as evidenced by not needing functional correction of leg length discrepancy during skilled PT sessions for two consecutive weeks    Time 8   Period Weeks   Status Revised   PT LONG TERM GOAL #3   Title Patient will be able to sit for at least 90 minutes in order to improve her ability to participate in functional activities such as church related events and driving    Time 8   Period Weeks   Status On-going   PT LONG TERM GOAL #4   Title Patient will be able to stand statically for at least 45 minutes and will be able to ambulate unlimited distances without assistive device in order to enhance her ability to perform functional tasks such as going to the grocery store    Time 8   Period Weeks   Status On-going   PT LONG TERM GOAL #5   Title Patient will demonstrate the ability to safely lift grocery bag weighing up  to 25 pounds with appropriate form and good safety awareness in order to reduce chances of re-injuring her back    Time 8   Period Weeks   Status On-going  Plan - 12/08/14 1812    Clinical Impression Statement Pt with overall good form with therex, however continued need for therapist facilitation to complete theraband in correct form.   Able to increase reps of therex today without diffiuculty.  continued focus on increasing functional strengthening and stretches. Pt in good alignment today without need for MET.    Clinical Impairments Affecting Rehab Potential chronic back problems    PT Next Visit Plan Continue to progress increasing Gluteal and hip flexor strength.  Add high march holds and increase stability exericses.  continue to check SI alignment and correct if needed.     Consulted and Agree with Plan of Care Patient        Problem List Patient Active Problem List   Diagnosis Date Noted  . 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 Diabetes mellitus due to underlying condition without complications 12/24/2013  . Hypertension, essential, benign 12/24/2013    Amy B Frazier, PTA/CLT [phone]  12/08/2014, 6:14 PM  [facility] [address] [place], [place], [address] Phone: [phone]   Fax:  [phone]      

## 2014-12-11 ENCOUNTER — Encounter (HOSPITAL_COMMUNITY): Payer: Medicare Other

## 2014-12-16 ENCOUNTER — Ambulatory Visit (HOSPITAL_COMMUNITY): Payer: Medicare Other | Attending: Internal Medicine | Admitting: Physical Therapy

## 2014-12-16 DIAGNOSIS — R269 Unspecified abnormalities of gait and mobility: Secondary | ICD-10-CM | POA: Diagnosis not present

## 2014-12-16 DIAGNOSIS — M5410 Radiculopathy, site unspecified: Secondary | ICD-10-CM | POA: Diagnosis not present

## 2014-12-16 DIAGNOSIS — R6889 Other general symptoms and signs: Secondary | ICD-10-CM | POA: Diagnosis not present

## 2014-12-16 DIAGNOSIS — M2170 Unequal limb length (acquired), unspecified site: Secondary | ICD-10-CM

## 2014-12-16 DIAGNOSIS — R531 Weakness: Secondary | ICD-10-CM | POA: Diagnosis not present

## 2014-12-16 DIAGNOSIS — R293 Abnormal posture: Secondary | ICD-10-CM

## 2014-12-16 NOTE — Therapy (Signed)
[facility] [address] [place], [place], [address] Phone: [phone]   Fax:  [phone]  Physical Therapy Treatment (Re-Assessment)  Patient Details  Name: Christine Sutton MRN: [id number] Date of Birth: 10-01-1929 Referring Provider:  Asencion Noble, MD  Encounter Date: 12/16/2014      PT End of Session - 12/16/14 1610    Visit Number 11   Number of Visits 16   Date for PT Re-Evaluation 01/13/15   Authorization Type Medicare/UHC    Authorization Time Period 10/20/14 to 12/20/14. Recert done for 7/78/24 to 02/21/15. G-code done 11th session.    Authorization - Visit Number 11   Authorization - Number of Visits 21   PT Start Time 2353   PT Stop Time 1600   PT Time Calculation (min) 42 min   Activity Tolerance Patient tolerated treatment well   Behavior During Therapy WFL for tasks assessed/performed      Past Medical History  Diagnosis Date  . Hypertension   . Diabetes mellitus   . High blood cholesterol level   . HX: breast cancer     left  . 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 Mastectomy Left   . Back surgery      lower    There were no vitals filed for this visit.  Visit Diagnosis:  Radicular low back pain - Plan: PT plan of care cert/re-cert  Generalized weakness - Plan: PT plan of care cert/re-cert  Poor posture - Plan: PT plan of care cert/re-cert  Decreased functional activity tolerance - Plan: PT plan of care cert/re-cert  Abnormality of gait - Plan: PT plan of care cert/re-cert  Leg length discrepancy - Plan: PT plan of care cert/re-cert      Subjective Assessment - 12/16/14 1538    Subjective Patient states she has been able to do more in her community but is still limited to around 2 hours before she has discomfort    Pertinent History Had back surgery in 2011, however this past February patient started having burning down  the back of her leg. Was referred back to her PCP by her back doctor, who trialed her on prednisone that does not seemed to have helped much so far. MD then recommended trailing therapy.  History of prior back surgery.    How long can you sit comfortably? 7/5- 2 hours    How long can you stand comfortably? 7/5- 2 hours    How long can you walk comfortably? 7/5- still around 20 minutes    Currently in Pain? No/denies            [facility] PT Assessment - 12/16/14 0001    Assessment   Medical Diagnosis R lumbar radiculopathy    Onset Date/Surgical Date 07/22/14   Next MD Visit Dr. Willey Blade every four months for blood work   Balance Screen   Has the patient fallen in the past 6 months No   Has the patient had a decrease in activity level because of a fear of falling?  Yes   Is the patient reluctant to leave their home because of a fear of falling?  No   Prior Function   Level of Independence Independent with basic ADLs;Independent with homemaking with ambulation;Independent with gait;Needs assistance with transfers;Other (comment)  has been using lift chair for sit to stand    Vocation Retired   Leisure  shopping and cleaning house, working in yard    Observation/Other Assessments   Focus on Therapeutic Outcomes (FOTO)  35% limited    AROM   Right Hip External Rotation  50   Right Hip Internal Rotation  32   Left Hip External Rotation  45   Left Hip Internal Rotation  34   Lumbar Flexion 80   Lumbar Extension 17   Lumbar - Right Side Bend 21   Lumbar - Left Side Bend 18   Strength   Overall Strength Comments no pain when first going to supine; core strength approx 2+/5   Right Hip Flexion 4/5   Right Hip Extension 3/5   Right Hip ABduction 3+/5   Left Hip Flexion 4/5   Left Hip Extension 3/5   Left Hip ABduction 3-/5   Right Knee Flexion 4/5   Right Knee Extension 4+/5   Left Knee Flexion 4-/5   Left Knee Extension 4+/5   Right Ankle Dorsiflexion 4+/5   Left Ankle Dorsiflexion  5/5                     [facility] Adult PT Treatment/Exercise - 12/16/14 0001    Lumbar Exercises: Stretches   Active Hamstring Stretch 3 reps;30 seconds   Active Hamstring Stretch Limitations 12" step   Passive Hamstring Stretch 3 reps;30 seconds   Passive Hamstring Stretch Limitations slantboard   Piriformis Stretch 2 reps;30 seconds   Piriformis Stretch Limitations seated                PT Education - 12/16/14 1610    Education provided Yes   Education Details progress with skilled PT services, plan of care moving forward    Person(s) Educated Patient   Methods Explanation   Comprehension Verbalized understanding          PT Short Term Goals - 12/16/14 1552    PT SHORT TERM GOAL #1   Title Patient will complain of no more than 4/10 pain when she performs functional tasks and activities around her home with no assistive device with no burning sensation or pain down R LE    Baseline 7/5- pain has not been exceeding 4/10 during household chores recently, burning sensation in R LE still present has improved but still there occasionally    Time 4   Period Weeks   Status Partially Met   PT SHORT TERM GOAL #2   Title Patient will demonstrate the ability to consistently perform efficient sit to stand transfer from surfaces of various heights and only occasional cues for sequencing and technique; she will also report that she is no longer using the lift chair at home    Baseline 6/13- able to stand up more easily but still having a catching pain in her back, not using lift chair anymore   Time 4   Period Weeks   Status Achieved   PT SHORT TERM GOAL #3   Title Patient will demonstrate at least 40 degrees bilateral hip ER and at least 38 degrees bilateral hip IR    Baseline 7/5-  still working on bilateral hip IR    Time 4   Period Weeks   Status Partially Met   PT SHORT TERM GOAL #4   Title Patient to be able to verbally state the importance of maintaining  correct posture and will consistently demonstrate proper posture through all functional tasks and activities   Baseline 7/5- still working on postural awareness but has improved  Time 4   Period Weeks   Status On-going           PT Long Term Goals - 12/16/14 1556    PT LONG TERM GOAL #1   Title Patient will correctly and consistently perform appropriate HEP with updates to exercises by PT staff as needed    Time 8   Period Weeks   Status Achieved   PT LONG TERM GOAL #2   Title Patient will demonstrate consistent improvement in leg length discrepany as evidenced by not needing functional correction of leg length discrepancy during skilled PT sessions for two consecutive weeks    Baseline 7/5- noted slight LLD to be corrected    Time 8   Period Weeks   Status On-going   PT LONG TERM GOAL #3   Title Patient will be able to sit for at least 90 minutes in order to improve her ability to participate in functional activities such as church related events and driving    Baseline 7/5- able to sit for 2 hours    Time 8   Period Weeks   Status Achieved   PT LONG TERM GOAL #4   Title Patient will be able to stand statically for at least 45 minutes and will be able to ambulate unlimited distances without assistive device in order to enhance her ability to perform functional tasks such as going to the grocery store    Baseline 7/5- able to stand at least 2 hours, has only been walking about 20 minute periods right now    Time 8   Period Weeks   Status Partially Met   PT LONG TERM GOAL #5   Title Patient will demonstrate the ability to safely lift grocery bag weighing up to 25 pounds with appropriate form and good safety awareness in order to reduce chances of re-injuring her back    Time 8   Period Weeks   Status On-going               Plan - 12/16/14 1611    Clinical Impression Statement Re-assessment performed today. Patient has greatly improved in terms of her functional  ability and functional activity tolerance at home, as well as in postural awareness and overall participation in functional activities outside of her home. However the patient does continue to demonstrate bilateral LE/proximal msucle/core weakness,  postural and gait impairments, and impaired lifting mechanics. Patient is very motivated to continue with skilled PT services and to learn how to manage condition on her own at this time. Patient will benefit from approximately 3 more sessions to focus on HEP, proximal muscle strength, lifting mechanics, and postural training.    Pt will benefit from skilled therapeutic intervention in order to improve on the following deficits Abnormal gait;Decreased coordination;Decreased range of motion;Difficulty walking;Impaired flexibility;Improper body mechanics;Decreased endurance;Impaired sensation;Postural dysfunction;Decreased activity tolerance;Decreased balance;Pain;Decreased mobility;Decreased strength;Impaired perceived functional ability   Rehab Potential Good   Clinical Impairments Affecting Rehab Potential chronic back problems    PT Frequency Other (comment)  3 more sessions    PT Duration Other (comment)  3 more sessions    PT Treatment/Interventions ADLs/Self Care Home Management;Gait training;Neuromuscular re-education;Stair training;Biofeedback;Functional mobility training;Patient/family education;Cryotherapy;Therapeutic activities;Therapeutic exercise;Manual techniques;Energy conservation;Moist Heat;DME Instruction;Balance training   PT Next Visit Plan Focus on developing HEP;  concentrate on proximal muscle and LE strength, lifting mechanics, postural training. Check SI alignment. DC in 3 sessions.    PT Home Exercise Plan Bridges and SLR for gluteal and hip flexor strengthening  Consulted and Agree with Plan of Care Patient          G-Codes - 12/22/14 1615    Functional Assessment Tool Used FOTO    Functional Limitation Mobility: Walking and  moving around   Mobility: Walking and Moving Around Current Status [phone]) At least 20 percent but less than 40 percent impaired, limited or restricted   Mobility: Walking and Moving Around Goal Status [phone]) At least 1 percent but less than 20 percent impaired, limited or restricted      Problem List Patient Active Problem List   Diagnosis Date Noted  . 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 Diabetes mellitus due to underlying condition without complications 78/58/8502  . Hypertension, essential, benign 12/24/2013   Physical Therapy Progress Note  Dates of Reporting Period: 11/24/14 to 2014/12/22  Objective Reports of Subjective Statement: patient reports she is feeling stronger and is able to participate in more events in her community   Objective Measurements: see above   Goal Update: see above   Plan: see above   Reason Skilled Services are Required: focus on development of extensive HEP for home management, proximal and LE strength, lifting mechanics, and continue postural training   Deniece Ree PT, DPT [place], [place], [address] Phone: [phone]   Fax:  [phone]

## 2014-12-18 ENCOUNTER — Ambulatory Visit (HOSPITAL_COMMUNITY): Payer: Medicare Other | Admitting: Physical Therapy

## 2014-12-18 DIAGNOSIS — R6889 Other general symptoms and signs: Secondary | ICD-10-CM | POA: Diagnosis not present

## 2014-12-18 DIAGNOSIS — R293 Abnormal posture: Secondary | ICD-10-CM | POA: Diagnosis not present

## 2014-12-18 DIAGNOSIS — R531 Weakness: Secondary | ICD-10-CM

## 2014-12-18 DIAGNOSIS — M2170 Unequal limb length (acquired), unspecified site: Secondary | ICD-10-CM

## 2014-12-18 DIAGNOSIS — M5410 Radiculopathy, site unspecified: Secondary | ICD-10-CM

## 2014-12-18 DIAGNOSIS — R269 Unspecified abnormalities of gait and mobility: Secondary | ICD-10-CM | POA: Diagnosis not present

## 2014-12-18 NOTE — Therapy (Signed)
[place], [place], [address] Phone: [phone]   Fax:  [phone]  Physical Therapy Treatment  Patient Details  Name: Christine Sutton MRN: [id number] Date of Birth: 26-Dec-1929 Referring Provider:  Asencion Noble, MD  Encounter Date: 12/18/2014      PT End of Session - 12/18/14 1511    Visit Number 12   Number of Visits 16   Date for PT Re-Evaluation 01/13/15   Authorization Type Medicare/[employer]    Authorization Time Period 10/20/14 to 12/20/14. Recert done for 6/96/29 to 02/21/15. G-code done 11th session.    Authorization - Visit Number 12   Authorization - Number of Visits 21   PT Start Time 5284   PT Stop Time 1516   PT Time Calculation (min) 43 min   Activity Tolerance Patient tolerated treatment well   Behavior During Therapy WFL for tasks assessed/performed      Past Medical History  Diagnosis Date  . Hypertension   . Diabetes mellitus   . High blood cholesterol level   . HX: breast cancer     left  . 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 Mastectomy Left   . Back surgery      lower    There were no vitals filed for this visit.  Visit Diagnosis:  Radicular low back pain  Generalized weakness  Poor posture  Decreased functional activity tolerance  Abnormality of gait  Leg length discrepancy      Subjective Assessment - 12/18/14 1435    Subjective Patient states she had some pain earlier today but is feeling better now; overall very pleasant this afternoon    Pertinent History Had back surgery in 2011, however this past February patient started having burning down the back of her leg. Was referred back to her PCP by her back doctor, who trialed her on prednisone that does not seemed to have helped much so far. MD then recommended trailing therapy.  History of prior back surgery.    Currently in Pain? No/denies                         [facility] Adult PT Treatment/Exercise - 12/18/14 0001    Lumbar Exercises: Stretches   Active Hamstring Stretch 3 reps;30 seconds   Active Hamstring Stretch Limitations 12" step   Passive Hamstring Stretch 3 reps;30 seconds   Passive Hamstring Stretch Limitations slantboard   Piriformis Stretch 2 reps;30 seconds   Piriformis Stretch Limitations seated   Lumbar Exercises: Aerobic   Stationary Bike Nustep seat 9 level 2 10 minutes   Lumbar Exercises: Standing   Other Standing Lumbar Exercises 3D hip excursions x15   Other Standing Lumbar Exercises Lifting mechanics with 10# box  and cues for safety/sequencing    Lumbar Exercises: Seated   Sit to Stand Limitations Posterior shoulder rolls, scapular retractions, thoracic excursions for posutral training 1x20                PT Education - 12/18/14 1510    Education provided Yes   Education Details education regarding proper lifting mechanics and postural awareness    Person(s) Educated Patient   Methods Explanation   Comprehension Verbalized understanding          PT Short Term Goals - 12/16/14 1552    PT SHORT TERM GOAL #1  Title Patient will complain of no more than 4/10 pain when she performs functional tasks and activities around her home with no assistive device with no burning sensation or pain down R LE    Baseline 7/5- pain has not been exceeding 4/10 during household chores recently, burning sensation in R LE still present has improved but still there occasionally    Time 4   Period Weeks   Status Partially Met   PT SHORT TERM GOAL #2   Title Patient will demonstrate the ability to consistently perform efficient sit to stand transfer from surfaces of various heights and only occasional cues for sequencing and technique; she will also report that she is no longer using the lift chair at home    Baseline 6/13- able to stand up more easily but still having a catching pain in her  back, not using lift chair anymore   Time 4   Period Weeks   Status Achieved   PT SHORT TERM GOAL #3   Title Patient will demonstrate at least 40 degrees bilateral hip ER and at least 38 degrees bilateral hip IR    Baseline 7/5-  still working on bilateral hip IR    Time 4   Period Weeks   Status Partially Met   PT SHORT TERM GOAL #4   Title Patient to be able to verbally state the importance of maintaining correct posture and will consistently demonstrate proper posture through all functional tasks and activities   Baseline 7/5- still working on postural awareness but has improved    Time 4   Period Weeks   Status On-going           PT Long Term Goals - 12/16/14 1556    PT LONG TERM GOAL #1   Title Patient will correctly and consistently perform appropriate HEP with updates to exercises by PT staff as needed    Time 8   Period Weeks   Status Achieved   PT LONG TERM GOAL #2   Title Patient will demonstrate consistent improvement in leg length discrepany as evidenced by not needing functional correction of leg length discrepancy during skilled PT sessions for two consecutive weeks    Baseline 7/5- noted slight LLD to be corrected    Time 8   Period Weeks   Status On-going   PT LONG TERM GOAL #3   Title Patient will be able to sit for at least 90 minutes in order to improve her ability to participate in functional activities such as church related events and driving    Baseline 7/5- able to sit for 2 hours    Time 8   Period Weeks   Status Achieved   PT LONG TERM GOAL #4   Title Patient will be able to stand statically for at least 45 minutes and will be able to ambulate unlimited distances without assistive device in order to enhance her ability to perform functional tasks such as going to the grocery store    Baseline 7/5- able to stand at least 2 hours, has only been walking about 20 minute periods right now    Time 8   Period Weeks   Status Partially Met   PT LONG TERM  GOAL #5   Title Patient will demonstrate the ability to safely lift grocery bag weighing up to 25 pounds with appropriate form and good safety awareness in order to reduce chances of re-injuring her back    Time 8   Period Weeks  Status On-going               Plan - 12/18/14 1512    Clinical Impression Statement Focused on functional stretching, functional and safe lifting mechanics, postural awareness, and functional activity tolerance today. Patient did require cues throughout session for safe performance of techniques and tasks during session. Demonstrates reduced thoracic spine mobility and reduced arm swing during gait.    Pt will benefit from skilled therapeutic intervention in order to improve on the following deficits Abnormal gait;Decreased coordination;Decreased range of motion;Difficulty walking;Impaired flexibility;Improper body mechanics;Decreased endurance;Impaired sensation;Postural dysfunction;Decreased activity tolerance;Decreased balance;Pain;Decreased mobility;Decreased strength;Impaired perceived functional ability   Rehab Potential Good   Clinical Impairments Affecting Rehab Potential chronic back problems    PT Frequency Other (comment)  2 more sessions    PT Duration Other (comment)  2 more comments    PT Treatment/Interventions ADLs/Self Care Home Management;Gait training;Neuromuscular re-education;Stair training;Biofeedback;Functional mobility training;Patient/family education;Cryotherapy;Therapeutic activities;Therapeutic exercise;Manual techniques;Energy conservation;Moist Heat;DME Instruction;Balance training   PT Next Visit Plan Focus on developing HEP;  concentrate on proximal muscle and LE strength, lifting mechanics, postural training. Check SI alignment. DC in 3 sessions.    PT Home Exercise Plan Bridges and SLR for gluteal and hip flexor strengthening   Consulted and Agree with Plan of Care Patient        Problem List Patient Active Problem List    Diagnosis Date Noted  . 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 Diabetes mellitus due to underlying condition without complications 58/25/1898  . Hypertension, essential, benign 12/24/2013    Deniece Ree PT, DPT [place] [address] [place], [place], [address] Phone: [phone]   Fax:  [phone]

## 2014-12-25 ENCOUNTER — Ambulatory Visit (HOSPITAL_COMMUNITY): Payer: Medicare Other | Admitting: Physical Therapy

## 2014-12-25 DIAGNOSIS — R531 Weakness: Secondary | ICD-10-CM

## 2014-12-25 DIAGNOSIS — M2170 Unequal limb length (acquired), unspecified site: Secondary | ICD-10-CM | POA: Diagnosis not present

## 2014-12-25 DIAGNOSIS — R6889 Other general symptoms and signs: Secondary | ICD-10-CM | POA: Diagnosis not present

## 2014-12-25 DIAGNOSIS — R269 Unspecified abnormalities of gait and mobility: Secondary | ICD-10-CM | POA: Diagnosis not present

## 2014-12-25 DIAGNOSIS — M5410 Radiculopathy, site unspecified: Secondary | ICD-10-CM | POA: Diagnosis not present

## 2014-12-25 DIAGNOSIS — R293 Abnormal posture: Secondary | ICD-10-CM

## 2014-12-25 NOTE — Therapy (Signed)
[place], [place], [address] Phone: [phone]   Fax:  [phone]  Physical Therapy Treatment  Patient Details  Name: Christine Sutton MRN: [id number] Date of Birth: 1930/05/12 Referring Provider:  Asencion Noble, MD  Encounter Date: 12/25/2014      PT End of Session - 12/25/14 1553    Visit Number 13   Number of Visits 16   Date for PT Re-Evaluation 01/13/15   Authorization Type Medicare/[employer]HC    Authorization Time Period 10/20/14 to 12/20/14. Recert done for 12/23/43 to 02/21/15. G-code done 11th session.    Authorization - Visit Number 13   Authorization - Number of Visits 21   PT Start Time 8099   PT Stop Time 1602   PT Time Calculation (min) 46 min   Activity Tolerance Patient tolerated treatment well   Behavior During Therapy WFL for tasks assessed/performed      Past Medical History  Diagnosis Date  . Hypertension   . Diabetes mellitus   . High blood cholesterol level   . HX: breast cancer     left  . 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 Mastectomy Left   . Back surgery      lower    There were no vitals filed for this visit.  Visit Diagnosis:  Radicular low back pain  Generalized weakness  Poor posture  Decreased functional activity tolerance  Abnormality of gait  Leg length discrepancy      Subjective Assessment - 12/25/14 1519    Subjective Patient reports she is doing well, only spot that is still bothering her is her low back. States she went to bible school this morning and had 6/10 pain earlier this morning, had to take pain pill. No pain right now.    Pertinent History Had back surgery in 2011, however this past February patient started having burning down the back of her leg. Was referred back to her PCP by her back doctor, who trialed her on prednisone that does not seemed to have helped much so  far. MD then recommended trailing therapy.  History of prior back surgery.    Currently in Pain? No/denies                         [facility] Adult PT Treatment/Exercise - 12/25/14 0001    Lumbar Exercises: Stretches   Active Hamstring Stretch 3 reps;30 seconds   Active Hamstring Stretch Limitations 12" step   Passive Hamstring Stretch 3 reps;30 seconds   Passive Hamstring Stretch Limitations slantboard   Piriformis Stretch 2 reps;30 seconds   Piriformis Stretch Limitations seated   Lumbar Exercises: Aerobic   Stationary Bike Nustep seat 9 level 2 10 minutes   Lumbar Exercises: Standing   Other Standing Lumbar Exercises Lifting mechanics with 15-20# box    Lumbar Exercises: Seated   Other Seated Lumbar Exercises Posterior shoulder rolls 1x20; scapular retractions 1x20, thoracic and cervical excursions 1x10                PT Education - 12/25/14 1552    Education provided Yes   Education Details educated regarding proper lifting mechanics and posture, proper technique to perform lifts and moving heavy loads with minimal stress on back    Person(s) Educated Patient   Methods Explanation   Comprehension Verbalized understanding  PT Short Term Goals - 12/16/14 1552    PT SHORT TERM GOAL #1   Title Patient will complain of no more than 4/10 pain when she performs functional tasks and activities around her home with no assistive device with no burning sensation or pain down R LE    Baseline 7/5- pain has not been exceeding 4/10 during household chores recently, burning sensation in R LE still present has improved but still there occasionally    Time 4   Period Weeks   Status Partially Met   PT SHORT TERM GOAL #2   Title Patient will demonstrate the ability to consistently perform efficient sit to stand transfer from surfaces of various heights and only occasional cues for sequencing and technique; she will also report that she is no longer using the lift  chair at home    Baseline 6/13- able to stand up more easily but still having a catching pain in her back, not using lift chair anymore   Time 4   Period Weeks   Status Achieved   PT SHORT TERM GOAL #3   Title Patient will demonstrate at least 40 degrees bilateral hip ER and at least 38 degrees bilateral hip IR    Baseline 7/5-  still working on bilateral hip IR    Time 4   Period Weeks   Status Partially Met   PT SHORT TERM GOAL #4   Title Patient to be able to verbally state the importance of maintaining correct posture and will consistently demonstrate proper posture through all functional tasks and activities   Baseline 7/5- still working on postural awareness but has improved    Time 4   Period Weeks   Status On-going           PT Long Term Goals - 12/16/14 1556    PT LONG TERM GOAL #1   Title Patient will correctly and consistently perform appropriate HEP with updates to exercises by PT staff as needed    Time 8   Period Weeks   Status Achieved   PT LONG TERM GOAL #2   Title Patient will demonstrate consistent improvement in leg length discrepany as evidenced by not needing functional correction of leg length discrepancy during skilled PT sessions for two consecutive weeks    Baseline 7/5- noted slight LLD to be corrected    Time 8   Period Weeks   Status On-going   PT LONG TERM GOAL #3   Title Patient will be able to sit for at least 90 minutes in order to improve her ability to participate in functional activities such as church related events and driving    Baseline 7/5- able to sit for 2 hours    Time 8   Period Weeks   Status Achieved   PT LONG TERM GOAL #4   Title Patient will be able to stand statically for at least 45 minutes and will be able to ambulate unlimited distances without assistive device in order to enhance her ability to perform functional tasks such as going to the grocery store    Baseline 7/5- able to stand at least 2 hours, has only been  walking about 20 minute periods right now    Time 8   Period Weeks   Status Partially Met   PT LONG TERM GOAL #5   Title Patient will demonstrate the ability to safely lift grocery bag weighing up to 25 pounds with appropriate form and good safety awareness in order to  reduce chances of re-injuring her back    Time 8   Period Weeks   Status On-going               Plan - 12/25/14 1553    Clinical Impression Statement Continued focus on functional stretches, functional and safe lifting, posture, functional activity tolerance today. Patient's lifting mechanics have improved however she does continue to require cues for safety especially when moving item from surface to surface to avoid twisting with load and placing stress on back.    Pt will benefit from skilled therapeutic intervention in order to improve on the following deficits Abnormal gait;Decreased coordination;Decreased range of motion;Difficulty walking;Impaired flexibility;Improper body mechanics;Decreased endurance;Impaired sensation;Postural dysfunction;Decreased activity tolerance;Decreased balance;Pain;Decreased mobility;Decreased strength;Impaired perceived functional ability   Rehab Potential Good   Clinical Impairments Affecting Rehab Potential chronic back problems    PT Frequency Other (comment)  1 more session    PT Duration Other (comment)  1 more session    PT Treatment/Interventions ADLs/Self Care Home Management;Gait training;Neuromuscular re-education;Stair training;Biofeedback;Functional mobility training;Patient/family education;Cryotherapy;Therapeutic activities;Therapeutic exercise;Manual techniques;Energy conservation;Moist Heat;DME Instruction;Balance training   PT Next Visit Plan Discharge assesssment; update HEP to address remaining strength and postural deficits.    PT Home Exercise Plan Bridges and SLR for gluteal and hip flexor strengthening   Consulted and Agree with Plan of Care Patient         Problem List Patient Active Problem List   Diagnosis Date Noted  . 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 Diabetes mellitus due to underlying condition without complications 14/38/8875  . Hypertension, essential, benign 12/24/2013    Deniece Ree PT, DPT [place] [address] [place], [place], [address] Phone: [phone]   Fax:  [phone]

## 2014-12-31 ENCOUNTER — Ambulatory Visit (HOSPITAL_COMMUNITY): Payer: Medicare Other | Admitting: Physical Therapy

## 2014-12-31 DIAGNOSIS — R6889 Other general symptoms and signs: Secondary | ICD-10-CM

## 2014-12-31 DIAGNOSIS — M2170 Unequal limb length (acquired), unspecified site: Secondary | ICD-10-CM | POA: Diagnosis not present

## 2014-12-31 DIAGNOSIS — M5410 Radiculopathy, site unspecified: Secondary | ICD-10-CM

## 2014-12-31 DIAGNOSIS — R531 Weakness: Secondary | ICD-10-CM

## 2014-12-31 DIAGNOSIS — R269 Unspecified abnormalities of gait and mobility: Secondary | ICD-10-CM | POA: Diagnosis not present

## 2014-12-31 DIAGNOSIS — R293 Abnormal posture: Secondary | ICD-10-CM | POA: Diagnosis not present

## 2014-12-31 NOTE — Therapy (Signed)
[facility] [address] [place], [place], [address] Phone: [phone]   Fax:  [phone]  Physical Therapy Treatment (Discharge)  Patient Details  Name: Christine Sutton MRN: [id number] Date of Birth: 12/14/1929 Referring Provider:  Asencion Noble, MD  Encounter Date: 12/31/2014      PT End of Session - 12/31/14 1643    Visit Number 14   Number of Visits 16   Authorization Type Medicare/UHC    Authorization Time Period 10/20/14 to 12/20/14. Recert done for 9/31/12 to 02/21/15. G-code done 11th session.    Authorization - Visit Number 14   PT Start Time 1520   PT Stop Time 1600   PT Time Calculation (min) 40 min   Activity Tolerance Patient tolerated treatment well   Behavior During Therapy WFL for tasks assessed/performed      Past Medical History  Diagnosis Date  . Hypertension   . Diabetes mellitus   . High blood cholesterol level   . HX: breast cancer     left  . 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 Mastectomy Left   . Back surgery      lower    There were no vitals filed for this visit.  Visit Diagnosis:  Radicular low back pain  Generalized weakness  Poor posture  Decreased functional activity tolerance  Abnormality of gait  Leg length discrepancy      Subjective Assessment - 12/31/14 1522    Subjective Patient reports she took a pain pill around 12:30, so not having any pain right now. Reports that piriformis stretch has been increasing her pain and requests that it be taken out of HEP.    Pertinent History Had back surgery in 2011, however this past February patient started having burning down the back of her leg. Was referred back to her PCP by her back doctor, who trialed her on prednisone that does not seemed to have helped much so far. MD then recommended trailing therapy.  History of prior back surgery.    Currently in Pain?  No/denies            [facility] PT Assessment - 12/31/14 0001    Observation/Other Assessments   Focus on Therapeutic Outcomes (FOTO)  34% limited    AROM   Right Hip External Rotation  48   Right Hip Internal Rotation  34   Left Hip External Rotation  45   Left Hip Internal Rotation  43   Lumbar Flexion 75   Lumbar Extension 16   Lumbar - Right Side Bend 21   Lumbar - Left Side Bend 21   Strength   Overall Strength Comments lower abs approx 2+/5, upper abs approx 3+/5   Right Hip Flexion 4/5   Right Hip Extension 3/5   Right Hip ABduction 3+/5   Left Hip Flexion 4/5   Left Hip Extension 3+/5   Left Hip ABduction 3-/5   Right Knee Flexion 4/5   Right Knee Extension 5/5   Left Knee Flexion 4/5   Left Knee Extension 5/5   Right Ankle Dorsiflexion 4+/5   Left Ankle Dorsiflexion 5/5                     [facility] Adult PT Treatment/Exercise - 12/31/14 0001    Lumbar Exercises: Stretches   Active Hamstring Stretch 3 reps;30 seconds   Active Hamstring  Stretch Limitations 12" step   Passive Hamstring Stretch 3 reps;30 seconds   Passive Hamstring Stretch Limitations slantboard                PT Education - 12/31/14 1643    Education provided Yes   Education Details updated HEP, education on importance of remaining active    Person(s) Educated Patient   Methods Explanation   Comprehension Verbalized understanding          PT Short Term Goals - 12/31/14 1549    PT SHORT TERM GOAL #1   Title Patient will complain of no more than 4/10 pain when she performs functional tasks and activities around her home with no assistive device with no burning sensation or pain down R LE    Time 4   Period Weeks   Status Achieved   PT SHORT TERM GOAL #2   Title Patient will demonstrate the ability to consistently perform efficient sit to stand transfer from surfaces of various heights and only occasional cues for sequencing and technique; she will also report that she is  no longer using the lift chair at home    Time 4   Period Weeks   Status Achieved   PT SHORT TERM GOAL #3   Title Patient will demonstrate at least 40 degrees bilateral hip ER and at least 38 degrees bilateral hip IR    Time 4   Period Weeks   Status Partially Met   PT SHORT TERM GOAL #4   Title Patient to be able to verbally state the importance of maintaining correct posture and will consistently demonstrate proper posture through all functional tasks and activities   Time 4   Period Weeks   Status Achieved           PT Long Term Goals - 12/31/14 1550    PT LONG TERM GOAL #1   Title Patient will correctly and consistently perform appropriate HEP with updates to exercises by PT staff as needed    Time 8   Period Weeks   Status Achieved   PT LONG TERM GOAL #2   Title Patient will demonstrate consistent improvement in leg length discrepany as evidenced by not needing functional correction of leg length discrepancy during skilled PT sessions for two consecutive weeks    Baseline 7/20 may always have slight discrepancy due to scoliosis    Time 8   Period Weeks   Status On-going   PT LONG TERM GOAL #3   Title Patient will be able to sit for at least 90 minutes in order to improve her ability to participate in functional activities such as church related events and driving    Time 8   Period Weeks   Status Achieved   PT LONG TERM GOAL #4   Title Patient will be able to stand statically for at least 45 minutes and will be able to ambulate unlimited distances without assistive device in order to enhance her ability to perform functional tasks such as going to the grocery store    Baseline 7/20- has not been walking a lot due to heat    Time 8   Period Weeks   Status Partially Met   PT LONG TERM GOAL #5   Title Patient will demonstrate the ability to safely lift grocery bag weighing up to 25 pounds with appropriate form and good safety awareness in order to reduce chances of  re-injuring her back    Baseline 7/20- has done up  to 15 to 20 pounds with cues for safety    Time 8   Period Weeks   Status On-going               Plan - Jan 06, 2015 1644    Clinical Impression Statement Discharge assessment performed today. Patient is able to do the majority of what she needs and wants to do at home, and reports that she can tell when she has reached her limit as her back starts to hurt after a point, however if she lays down for approximately 30 minutes her pain goes away. Patient is consistent with HEP and is highly motivated to remain active in her community. At this time she is no longer in need of skilled PT services and is discharged to independent home management of her condition.    Pt will benefit from skilled therapeutic intervention in order to improve on the following deficits Abnormal gait;Decreased coordination;Decreased range of motion;Difficulty walking;Impaired flexibility;Improper body mechanics;Decreased endurance;Impaired sensation;Postural dysfunction;Decreased activity tolerance;Decreased balance;Pain;Decreased mobility;Decreased strength;Impaired perceived functional ability   Rehab Potential Good   Clinical Impairments Affecting Rehab Potential chronic back problems    PT Treatment/Interventions ADLs/Self Care Home Management;Gait training;Neuromuscular re-education;Stair training;Biofeedback;Functional mobility training;Patient/family education;Cryotherapy;Therapeutic activities;Therapeutic exercise;Manual techniques;Energy conservation;Moist Heat;DME Instruction;Balance training   PT Next Visit Plan DC today    Consulted and Agree with Plan of Care Patient          G-Codes - 2015-01-06 1646    Functional Assessment Tool Used FOTO    Functional Limitation Mobility: Walking and moving around   Mobility: Walking and Moving Around Goal Status [id number]) At least 1 percent but less than 20 percent impaired, limited or restricted   Mobility: Walking and  Moving Around Discharge Status [phone]) At least 20 percent but less than 40 percent impaired, limited or restricted      Problem List Patient Active Problem List   Diagnosis Date Noted  . 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 Diabetes mellitus due to underlying condition without complications 00/37/0488  . Hypertension, essential, benign 12/24/2013    PHYSICAL THERAPY DISCHARGE SUMMARY  Visits from Start of Care: 14  Current functional level related to goals / functional outcomes: Patient is able to do the majority of what she needs and wants to do at home and in community; she is aware of her limitations and is highly motivated to continue being active and keeping up with HEP.    Remaining deficits: Back stiffness, reduced functional activity tolerance, reduced strength    Education / Equipment: Updated HEP, importance of remaining active, instructed that if Piriformis stretch is causing sharp pain rather than stretch pain to hold stretch from HEP  Plan: Patient agrees to discharge.  Patient goals were partially met. Patient is being discharged due to being pleased with the current functional level.  ?????        Deniece Ree PT, DPT [facility] [address] [place], [place], [address] Phone: [phone]   Fax:  [phone]

## 2014-12-31 NOTE — Patient Instructions (Signed)
   SHOULDER ROLLS  Move your shoulders in a circular pattern as shown so that your are moving in an up, back and down direction. Perform small cicles if needed for comfort. Repeat 20 times, 2 times a day.     SCAPULAR RETRACTIONS  Draw your shoulder blades back and down. It should feel like you are pinching your shoulder blades together. Repeat 15 times, twice a day.  Functional Quadriceps: Sit to Stand   Sit on edge of chair, feet flat on floor. Stand upright, extending knees fully. Raise your arms up over your head as you are standing up. When you have stood all the way, raise up on your tip toes.  Repeat __10__ times per set. Do __1__ sets per session. Do _2___ sessions per day.  http://orth.exer.us/735   Copyright  VHI. All rights reserved.

## 2015-01-27 ENCOUNTER — Encounter (HOSPITAL_COMMUNITY): Payer: Self-pay | Admitting: Hematology & Oncology

## 2015-01-27 ENCOUNTER — Encounter (HOSPITAL_COMMUNITY): Payer: Medicare Other | Attending: Hematology & Oncology | Admitting: Hematology & Oncology

## 2015-01-27 ENCOUNTER — Other Ambulatory Visit (HOSPITAL_COMMUNITY): Payer: Self-pay | Admitting: Internal Medicine

## 2015-01-27 VITALS — BP 137/67 | HR 72 | Temp 97.8°F | Resp 16 | Wt 145.2 lb

## 2015-01-27 DIAGNOSIS — C50912 Malignant neoplasm of unspecified site of left female breast: Secondary | ICD-10-CM | POA: Diagnosis not present

## 2015-01-27 DIAGNOSIS — M810 Age-related osteoporosis without current pathological fracture: Secondary | ICD-10-CM

## 2015-01-27 DIAGNOSIS — Z1231 Encounter for screening mammogram for malignant neoplasm of breast: Secondary | ICD-10-CM

## 2015-01-27 NOTE — Patient Instructions (Addendum)
[facility] at [facility] Discharge Instructions  RECOMMENDATIONS MADE BY THE CONSULTANT AND ANY TEST RESULTS WILL BE SENT TO YOUR REFERRING PHYSICIAN.  Exam and discussion by Dr. Youlanda Roys are doing well. Mammogram as ordered in October. Report any new lumps, bone pain, shortness of breath or other symptoms. Call with any concerns or questions.  Follow-up in 1 year.   Thank you for choosing [facility] at [facility] to provide your oncology and hematology care.  To afford each patient quality time with our provider, please arrive at least 15 minutes before your scheduled appointment time.    You need to re-schedule your appointment should you arrive 10 or more minutes late.  We strive to give you quality time with our providers, and arriving late affects you and other patients whose appointments are after yours.  Also, if you no show three or more times for appointments you may be dismissed from the clinic at the providers discretion.     Again, thank you for choosing [facility].  Our hope is that these requests will decrease the amount of time that you wait before being seen by our physicians.       _____________________________________________________________  Should you have questions after your visit to [facility], please contact our office at [phone] between the hours of 8:30 a.m. and 4:30 p.m.  Voicemails left after 4:30 p.m. will not be returned until the following business day.  For prescription refill requests, have your pharmacy contact our office.

## 2015-01-27 NOTE — Progress Notes (Signed)
[facility] NOTE  Patient Care Team: Asencion Noble, MD as PCP - General (Internal Medicine)  CHIEF COMPLAINTS/PURPOSE OF CONSULTATION:  ER positive carcinoma of the left breast, stage I disease Mastectomy with sentinel node biopsy on 03/08/2011 Osteoporosis with intolerance to the Evista   HISTORY OF PRESENTING ILLNESS:  Christine Sutton 81 y.o. female is here for breast cancer follow-up.  She says she's been doing well. Her last mammogram was Otober 5th 2015. She has had a mastectomy of her left breast, done by Dr. Ellery Plunk (sp?). She has noted feeling some thickness in her R breast.  She says that her bowels have been good with Miralax once daily, as advised by Dr. Laural Golden.  She says she has back problems, but they aren't limiting. She's been independent for 16 years and able to do everything by herself.   She has one son and has grandchildren. Her son lost his wife seven months ago, but her son has been very supportive of her. She has been advising her son to get out and move forward with his life. Her son is 12 and also has grandchildren of his own.  She is not worried and does not have any personal concerns. She is energetic and sociable, and keeps repeating that she feels "so blessed."  She just had laser eye surgery to remove her cataracts. She says she feels as though there is a "little film" in her eyes. She will follow up with her eye surgeon  in one month. She currently uses eye drops.  MEDICAL HISTORY:  Past Medical History  Diagnosis Date  . Hypertension   . Diabetes mellitus   . High blood cholesterol level   . HX: breast cancer     left  . Invasive ductal carcinoma of left breast 01/27/2014    ER +    SURGICAL HISTORY: Past Surgical History  Procedure Laterality Date  . Mastectomy Left   . Back surgery      lower  . Cataract extraction, bilateral  11/2014    SOCIAL HISTORY: Social History   Social History  . Marital  Status: Widowed    Spouse Name: N/A  . Number of Children: N/A  . Years of Education: N/A   Occupational History  . Not on file.   Social History Main Topics  . Smoking status: Never Smoker   . Smokeless tobacco: Never Used  . Alcohol Use: No  . Drug Use: No  . Sexual Activity: Not on file   Other Topics Concern  . Not on file   Social History Narrative    FAMILY HISTORY: Family History  Problem Relation Age of Onset  . Cancer Mother   . Cancer Sister   . Cancer Sister    indicated that her mother is deceased. She indicated that both of her sisters are deceased.   ALLERGIES:  is allergic to codeine.  MEDICATIONS:  Current Outpatient Prescriptions  Medication Sig Dispense Refill  . acetaminophen (TYLENOL) 500 MG tablet Take 500 mg by mouth as needed.    . ALPRAZolam (XANAX) 0.5 MG tablet Take 0.5 mg by mouth daily.    Marland Kitchen amLODipine (NORVASC) 5 MG tablet Take 5 mg by mouth daily.    Marland Kitchen aspirin 81 MG chewable tablet Chew 81 mg by mouth at bedtime.    . diphenhydramine-acetaminophen (TYLENOL PM) 25-500 MG TABS Take 1 tablet by mouth at bedtime as needed.    Marland Kitchen HYDROcodone-acetaminophen (NORCO/VICODIN) 5-325 MG  per tablet Take 1 tablet by mouth every 6 (six) hours as needed for moderate pain.    Marland Kitchen lisinopril-hydrochlorothiazide (PRINZIDE,ZESTORETIC) 20-25 MG per tablet Take 1 tablet by mouth 2 (two) times daily.     . metFORMIN (GLUCOPHAGE) 1000 MG tablet Take 1,000 mg by mouth 2 (two) times daily.    . Multiple Vitamins-Minerals (CENTRUM SILVER PO) Take 1 capsule by mouth daily.    . polyethylene glycol (MIRALAX / GLYCOLAX) packet Take 17 g by mouth daily.    . raloxifene (EVISTA) 60 MG tablet Take 1 tablet (60 mg total) by mouth daily. 30 tablet 6  . Calcium Carbonate-Vitamin D (OYSTER SHELL CALCIUM/D) 250-125 MG-UNIT TABS Take 1 tablet by mouth daily.     No current facility-administered medications for this visit.    Review of Systems  Musculoskeletal: Positive for  back pain.  All other systems reviewed and are negative.  14 point ROS was done and is otherwise as detailed above or in HPI    PHYSICAL EXAMINATION: ECOG PERFORMANCE STATUS: 0 - Asymptomatic  Filed Vitals:   01/27/15 1114  BP: 137/67  Pulse: 72  Temp: 97.8 F (36.6 C)  Resp: 16   Filed Weights   01/27/15 1114  Weight: 145 lb 3.2 oz (65.862 kg)     Physical Exam  Constitutional: She is oriented to person, place, and time and well-developed, well-nourished, and in no distress.  HENT:  Head: Normocephalic and atraumatic.  Nose: Nose normal.  Mouth/Throat: Oropharynx is clear and moist. No oropharyngeal exudate.  Eyes: Conjunctivae and EOM are normal. Pupils are equal, round, and reactive to light. Right eye exhibits no discharge. Left eye exhibits no discharge. No scleral icterus.  Neck: Normal range of motion. Neck supple. No tracheal deviation present. No thyromegaly present.  Cardiovascular: Normal rate, regular rhythm and normal heart sounds.  Exam reveals no gallop and no friction rub.   No murmur heard. Pulmonary/Chest: Effort normal and breath sounds normal. She has no wheezes. She has no rales.    Abdominal: Soft. Bowel sounds are normal. She exhibits no distension and no mass. There is no tenderness. There is no rebound and no guarding.  Musculoskeletal: Normal range of motion. She exhibits no edema.  Lymphadenopathy:    She has no cervical adenopathy.  Neurological: She is alert and oriented to person, place, and time. She has normal reflexes. No cranial nerve deficit. Gait normal. Coordination normal.  Skin: Skin is warm and dry. No rash noted.  Psychiatric: Mood, memory, affect and judgment normal.  Nursing note and vitals reviewed.     LABORATORY DATA:  I have reviewed the data as listed Lab Results  Component Value Date   WBC 10.4 06/11/2009   HGB 14.5 06/11/2009   HCT 43.0 06/11/2009   MCV 91.8 06/11/2009   PLT 328 06/11/2009     RADIOGRAPHIC  STUDIES: I have personally reviewed the radiological images as listed and agreed with the findings in the report.  CLINICAL DATA: Screening.  EXAM: DIGITAL SCREENING UNILATERAL RIGHT MAMMOGRAM WITH CAD  COMPARISON: Previous exam(s)  ACR Breast Density Category c: The breast tissue is heterogeneously dense, which may obscure small masses.  FINDINGS: The patient has had a left mastectomy. There are no findings suspicious for malignancy. Images were processed with CAD.  IMPRESSION: No mammographic evidence of malignancy. A result letter of this screening mammogram will be mailed directly to the patient.  RECOMMENDATION: Screening mammogram in one year. (Code:SM-B-01Y)  BI-RADS CATEGORY 1: Negative.  Electronically Signed  By: Pamelia Hoit M.D.  On: 03/21/2014 17:38  ASSESSMENT & PLAN:  ER positive carcinoma of the left breast, stage I disease Mastectomy with sentinel node biopsy on 03/08/2011 Osteoporosis with intolerance to the Evista  She has no obvious evidence of recurrence on exam. She remains independent and active. She will be due again for mammogram in October. She would like to continue with yearly follow-ups with [place].  Follow-up in 1 year with breast exam.  All questions were answered. The patient knows to call the clinic with any problems, questions or concerns.  This document serves as a record of services personally performed by Ancil Linsey, MD. It was created on her behalf by Toni Amend, a trained medical scribe. The creation of this record is based on the scribe's personal observations and the provider's statements to them. This document has been checked and approved by the attending provider.  I have reviewed the above documentation for accuracy and completeness, and I agree with the above.  This note was electronically signed.    Molli Hazard, MD  02/22/2015 5:28 PM

## 2015-02-22 ENCOUNTER — Encounter (HOSPITAL_COMMUNITY): Payer: Self-pay | Admitting: Hematology & Oncology

## 2015-03-03 DIAGNOSIS — Z961 Presence of intraocular lens: Secondary | ICD-10-CM | POA: Diagnosis not present

## 2015-03-13 DIAGNOSIS — Z23 Encounter for immunization: Secondary | ICD-10-CM | POA: Diagnosis not present

## 2015-03-13 DIAGNOSIS — Z6823 Body mass index (BMI) 23.0-23.9, adult: Secondary | ICD-10-CM | POA: Diagnosis not present

## 2015-03-13 DIAGNOSIS — S90822A Blister (nonthermal), left foot, initial encounter: Secondary | ICD-10-CM | POA: Diagnosis not present

## 2015-03-16 ENCOUNTER — Ambulatory Visit (HOSPITAL_COMMUNITY)
Admission: RE | Admit: 2015-03-16 | Discharge: 2015-03-16 | Disposition: A | Discharge status: Home / Self Care | Payer: Medicare Other | Source: Ambulatory Visit | Attending: Internal Medicine | Admitting: Internal Medicine

## 2015-03-16 DIAGNOSIS — Z1231 Encounter for screening mammogram for malignant neoplasm of breast: Secondary | ICD-10-CM | POA: Insufficient documentation

## 2015-03-18 DIAGNOSIS — S90822A Blister (nonthermal), left foot, initial encounter: Secondary | ICD-10-CM | POA: Diagnosis not present

## 2015-03-25 DIAGNOSIS — E119 Type 2 diabetes mellitus without complications: Secondary | ICD-10-CM | POA: Diagnosis not present

## 2015-04-02 DIAGNOSIS — Z6824 Body mass index (BMI) 24.0-24.9, adult: Secondary | ICD-10-CM | POA: Diagnosis not present

## 2015-04-02 DIAGNOSIS — I10 Essential (primary) hypertension: Secondary | ICD-10-CM | POA: Diagnosis not present

## 2015-04-02 DIAGNOSIS — E1129 Type 2 diabetes mellitus with other diabetic kidney complication: Secondary | ICD-10-CM | POA: Diagnosis not present

## 2015-07-27 DIAGNOSIS — E119 Type 2 diabetes mellitus without complications: Secondary | ICD-10-CM | POA: Diagnosis not present

## 2015-08-03 DIAGNOSIS — M4726 Other spondylosis with radiculopathy, lumbar region: Secondary | ICD-10-CM | POA: Diagnosis not present

## 2015-08-03 DIAGNOSIS — M4806 Spinal stenosis, lumbar region: Secondary | ICD-10-CM | POA: Diagnosis not present

## 2015-08-03 DIAGNOSIS — M546 Pain in thoracic spine: Secondary | ICD-10-CM | POA: Diagnosis not present

## 2015-08-03 DIAGNOSIS — M549 Dorsalgia, unspecified: Secondary | ICD-10-CM | POA: Diagnosis not present

## 2015-08-03 DIAGNOSIS — M4155 Other secondary scoliosis, thoracolumbar region: Secondary | ICD-10-CM | POA: Diagnosis not present

## 2015-08-03 DIAGNOSIS — M5136 Other intervertebral disc degeneration, lumbar region: Secondary | ICD-10-CM | POA: Diagnosis not present

## 2015-08-03 DIAGNOSIS — M5416 Radiculopathy, lumbar region: Secondary | ICD-10-CM | POA: Diagnosis not present

## 2015-08-04 DIAGNOSIS — M4726 Other spondylosis with radiculopathy, lumbar region: Secondary | ICD-10-CM | POA: Diagnosis not present

## 2015-08-04 DIAGNOSIS — Z6825 Body mass index (BMI) 25.0-25.9, adult: Secondary | ICD-10-CM | POA: Diagnosis not present

## 2015-08-04 DIAGNOSIS — I10 Essential (primary) hypertension: Secondary | ICD-10-CM | POA: Diagnosis not present

## 2015-08-04 DIAGNOSIS — E1129 Type 2 diabetes mellitus with other diabetic kidney complication: Secondary | ICD-10-CM | POA: Diagnosis not present

## 2015-08-06 DIAGNOSIS — M5126 Other intervertebral disc displacement, lumbar region: Secondary | ICD-10-CM | POA: Diagnosis not present

## 2015-08-06 DIAGNOSIS — M4806 Spinal stenosis, lumbar region: Secondary | ICD-10-CM | POA: Diagnosis not present

## 2015-08-12 DIAGNOSIS — M4155 Other secondary scoliosis, thoracolumbar region: Secondary | ICD-10-CM | POA: Diagnosis not present

## 2015-08-12 DIAGNOSIS — M5136 Other intervertebral disc degeneration, lumbar region: Secondary | ICD-10-CM | POA: Diagnosis not present

## 2015-08-12 DIAGNOSIS — M5416 Radiculopathy, lumbar region: Secondary | ICD-10-CM | POA: Diagnosis not present

## 2015-08-12 DIAGNOSIS — M4726 Other spondylosis with radiculopathy, lumbar region: Secondary | ICD-10-CM | POA: Diagnosis not present

## 2015-08-12 DIAGNOSIS — M4806 Spinal stenosis, lumbar region: Secondary | ICD-10-CM | POA: Diagnosis not present

## 2015-09-15 DIAGNOSIS — M4806 Spinal stenosis, lumbar region: Secondary | ICD-10-CM | POA: Diagnosis not present

## 2015-09-15 DIAGNOSIS — I10 Essential (primary) hypertension: Secondary | ICD-10-CM | POA: Diagnosis not present

## 2015-09-15 DIAGNOSIS — M4155 Other secondary scoliosis, thoracolumbar region: Secondary | ICD-10-CM | POA: Diagnosis not present

## 2015-09-15 DIAGNOSIS — M5416 Radiculopathy, lumbar region: Secondary | ICD-10-CM | POA: Diagnosis not present

## 2015-09-15 DIAGNOSIS — M5136 Other intervertebral disc degeneration, lumbar region: Secondary | ICD-10-CM | POA: Diagnosis not present

## 2015-09-15 DIAGNOSIS — M4726 Other spondylosis with radiculopathy, lumbar region: Secondary | ICD-10-CM | POA: Diagnosis not present

## 2015-09-16 ENCOUNTER — Encounter (HOSPITAL_COMMUNITY): Payer: Self-pay

## 2015-09-16 DIAGNOSIS — S90562A Insect bite (nonvenomous), left ankle, initial encounter: Secondary | ICD-10-CM | POA: Diagnosis not present

## 2015-12-02 DIAGNOSIS — C50919 Malignant neoplasm of unspecified site of unspecified female breast: Secondary | ICD-10-CM | POA: Diagnosis not present

## 2015-12-02 DIAGNOSIS — I10 Essential (primary) hypertension: Secondary | ICD-10-CM | POA: Diagnosis not present

## 2015-12-02 DIAGNOSIS — E119 Type 2 diabetes mellitus without complications: Secondary | ICD-10-CM | POA: Diagnosis not present

## 2015-12-02 DIAGNOSIS — E785 Hyperlipidemia, unspecified: Secondary | ICD-10-CM | POA: Diagnosis not present

## 2015-12-02 DIAGNOSIS — Z79899 Other long term (current) drug therapy: Secondary | ICD-10-CM | POA: Diagnosis not present

## 2015-12-10 DIAGNOSIS — E1129 Type 2 diabetes mellitus with other diabetic kidney complication: Secondary | ICD-10-CM | POA: Diagnosis not present

## 2015-12-10 DIAGNOSIS — Z23 Encounter for immunization: Secondary | ICD-10-CM | POA: Diagnosis not present

## 2015-12-10 DIAGNOSIS — Z6825 Body mass index (BMI) 25.0-25.9, adult: Secondary | ICD-10-CM | POA: Diagnosis not present

## 2015-12-10 DIAGNOSIS — Z853 Personal history of malignant neoplasm of breast: Secondary | ICD-10-CM | POA: Diagnosis not present

## 2015-12-10 DIAGNOSIS — I10 Essential (primary) hypertension: Secondary | ICD-10-CM | POA: Diagnosis not present

## 2015-12-22 DIAGNOSIS — M4806 Spinal stenosis, lumbar region: Secondary | ICD-10-CM | POA: Diagnosis not present

## 2015-12-22 DIAGNOSIS — M4726 Other spondylosis with radiculopathy, lumbar region: Secondary | ICD-10-CM | POA: Diagnosis not present

## 2015-12-22 DIAGNOSIS — M5416 Radiculopathy, lumbar region: Secondary | ICD-10-CM | POA: Diagnosis not present

## 2015-12-22 DIAGNOSIS — I10 Essential (primary) hypertension: Secondary | ICD-10-CM | POA: Diagnosis not present

## 2015-12-22 DIAGNOSIS — M5136 Other intervertebral disc degeneration, lumbar region: Secondary | ICD-10-CM | POA: Diagnosis not present

## 2015-12-29 DIAGNOSIS — E119 Type 2 diabetes mellitus without complications: Secondary | ICD-10-CM | POA: Diagnosis not present

## 2015-12-29 DIAGNOSIS — Z961 Presence of intraocular lens: Secondary | ICD-10-CM | POA: Diagnosis not present

## 2016-02-03 ENCOUNTER — Encounter (HOSPITAL_COMMUNITY): Payer: Medicare Other | Admitting: Hematology & Oncology

## 2016-02-03 ENCOUNTER — Other Ambulatory Visit (HOSPITAL_COMMUNITY): Payer: Self-pay | Admitting: Hematology & Oncology

## 2016-02-03 DIAGNOSIS — Z1231 Encounter for screening mammogram for malignant neoplasm of breast: Secondary | ICD-10-CM

## 2016-03-02 DIAGNOSIS — L820 Inflamed seborrheic keratosis: Secondary | ICD-10-CM | POA: Diagnosis not present

## 2016-03-02 DIAGNOSIS — D0472 Carcinoma in situ of skin of left lower limb, including hip: Secondary | ICD-10-CM | POA: Diagnosis not present

## 2016-03-21 ENCOUNTER — Ambulatory Visit (HOSPITAL_COMMUNITY)
Admission: RE | Admit: 2016-03-21 | Discharge: 2016-03-21 | Disposition: A | Discharge status: Home / Self Care | Payer: Medicare Other | Source: Ambulatory Visit | Attending: Hematology & Oncology | Admitting: Hematology & Oncology

## 2016-03-21 DIAGNOSIS — Z1231 Encounter for screening mammogram for malignant neoplasm of breast: Secondary | ICD-10-CM | POA: Insufficient documentation

## 2016-03-29 ENCOUNTER — Encounter (HOSPITAL_COMMUNITY): Payer: Medicare Other | Attending: Hematology & Oncology | Admitting: Hematology & Oncology

## 2016-03-29 VITALS — BP 150/71 | HR 79 | Temp 97.7°F | Resp 20 | Wt 153.4 lb

## 2016-03-29 DIAGNOSIS — C50912 Malignant neoplasm of unspecified site of left female breast: Secondary | ICD-10-CM | POA: Diagnosis not present

## 2016-03-29 DIAGNOSIS — M810 Age-related osteoporosis without current pathological fracture: Secondary | ICD-10-CM

## 2016-03-29 DIAGNOSIS — Z1231 Encounter for screening mammogram for malignant neoplasm of breast: Secondary | ICD-10-CM | POA: Diagnosis not present

## 2016-03-29 DIAGNOSIS — Z1239 Encounter for other screening for malignant neoplasm of breast: Secondary | ICD-10-CM

## 2016-03-29 DIAGNOSIS — Z23 Encounter for immunization: Secondary | ICD-10-CM | POA: Diagnosis not present

## 2016-03-29 NOTE — Progress Notes (Signed)
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 female is here for Left breast cancer follow-up.  Patient says she is well. She lives alone and now has lifeline incase she ever needs it. Christine Sutton says she is active in her church and community.  She denies abdominal pain or bowel changes.   Christine Sutton says she self-checks her breasts for lumps at home regularly. She has not noticed anything unusual. Last mammogram was on 10/11.   Patient has already received flu shot and is up to date on pneumonia shot. Her PCP is Dr. Willey Blade.  Appetite is good. Energy level is unchanged. No new pain.   MEDICAL HISTORY:  Past Medical History:  Diagnosis Date  . Diabetes mellitus   . High blood cholesterol level   . HX: breast cancer    left  . Hypertension   . Invasive ductal carcinoma of left breast 01/27/2014   ER +    SURGICAL HISTORY: Past Surgical History:  Procedure Laterality Date  . BACK SURGERY     lower  . CATARACT EXTRACTION, BILATERAL  11/2014  . MASTECTOMY Left     SOCIAL HISTORY: Social History   Social History  . Marital status: Widowed    Spouse name: N/A  . Number of children: N/A  . Years of education: N/A   Occupational History  . Not on file.   Social History Main Topics  . Smoking status: Never Smoker  . Smokeless tobacco: Never Used  . Alcohol use No  . Drug use: No  . Sexual activity: Not on file   Other Topics Concern  . Not on file   Social History Narrative  . No narrative on file    FAMILY HISTORY: Family History  Problem Relation Age of Onset  . Cancer Mother   . Cancer Sister   . Cancer Sister    indicated  that her mother is deceased. She indicated that both of her sisters are deceased.    ALLERGIES:  is allergic to codeine.  MEDICATIONS:  Current Outpatient Prescriptions  Medication Sig Dispense Refill  . acetaminophen (TYLENOL) 500 MG tablet Take 500 mg by mouth as needed.    Marland Kitchen amLODipine (NORVASC) 5 MG tablet Take 5 mg by mouth daily.    Marland Kitchen aspirin 81 MG chewable tablet Chew 81 mg by mouth at bedtime.    . Calcium Carbonate-Vitamin D (OYSTER SHELL CALCIUM/D) 250-125 MG-UNIT TABS Take 1 tablet by mouth daily.    . diphenhydramine-acetaminophen (TYLENOL PM) 25-500 MG TABS Take 1 tablet by mouth at bedtime as needed.    Marland Kitchen HYDROcodone-acetaminophen (NORCO/VICODIN) 5-325 MG per tablet Take 1 tablet by mouth every 6 (six) hours as needed for moderate pain.    Marland Kitchen lisinopril-hydrochlorothiazide (PRINZIDE,ZESTORETIC) 20-25 MG per tablet Take 1 tablet by mouth 2 (two) times daily.     . metFORMIN (GLUCOPHAGE) 1000 MG tablet Take 1,000 mg by mouth 2 (two) times daily.    . Multiple Vitamins-Minerals (CENTRUM SILVER PO) Take 1 capsule by mouth daily.    . polyethylene glycol (MIRALAX / GLYCOLAX) packet Take 17 g by mouth daily.    . raloxifene (EVISTA) 60 MG tablet Take 1 tablet (60  mg total) by mouth daily. 30 tablet 6   No current facility-administered medications for this visit.     Review of Systems  Musculoskeletal: Positive for back pain.       Pt had surgery on her back   All other systems reviewed and are negative. 14 point ROS was done and is otherwise as detailed above or in HPI  PHYSICAL EXAMINATION: ECOG PERFORMANCE STATUS: 0 - Asymptomatic  Vitals:   03/29/16 1045 03/29/16 1049  BP: (!) 154/72 (!) 150/71  Pulse: 79   Resp: 20   Temp: 97.7 F (36.5 C)    Filed Weights   03/29/16 1045  Weight: 153 lb 6.4 oz (69.6 kg)    Physical Exam  Constitutional: She is oriented to person, place, and time and well-developed, well-nourished, and in no distress.  HENT:  Head:  Normocephalic and atraumatic.  Nose: Nose normal.  Mouth/Throat: Oropharynx is clear and moist. No oropharyngeal exudate.  Eyes: Conjunctivae and EOM are normal. Pupils are equal, round, and reactive to light. Right eye exhibits no discharge. Left eye exhibits no discharge. No scleral icterus.  Neck: Normal range of motion. Neck supple. No tracheal deviation present. No thyromegaly present.  Cardiovascular: Normal rate, regular rhythm and normal heart sounds.  Exam reveals no gallop and no friction rub.   No murmur heard. Pulmonary/Chest: Effort normal and breath sounds normal. She has no wheezes. She has no rales.    Abdominal: Soft. Bowel sounds are normal. She exhibits no distension and no mass. There is no tenderness. There is no rebound and no guarding.  Musculoskeletal: Normal range of motion. She exhibits no edema.  Lymphadenopathy:    She has no cervical adenopathy.  Neurological: She is alert and oriented to person, place, and time. She has normal reflexes. No cranial nerve deficit. Gait normal. Coordination normal.  Skin: Skin is warm and dry. No rash noted.  Psychiatric: Mood, memory, affect and judgment normal.  Nursing note and vitals reviewed.     LABORATORY DATA:  I have reviewed the data as listed Lab Results  Component Value Date   WBC 10.4 06/11/2009   HGB 14.5 06/11/2009   HCT 43.0 06/11/2009   MCV 91.8 06/11/2009   PLT 328 06/11/2009     RADIOGRAPHIC STUDIES: I have personally reviewed the radiological images as listed and agreed with the findings in the report. CLINICAL DATA:  Screening.  EXAM: 2D DIGITAL SCREENING UNILATERAL RIGHT MAMMOGRAM WITH CAD AND ADJUNCT TOMO  COMPARISON:  Previous exam(s).  ACR Breast Density Category c: The breast tissue is heterogeneously dense, which may obscure small masses.  FINDINGS: There are no findings suspicious for malignancy. Images were processed with CAD.  IMPRESSION: No mammographic evidence of  malignancy. A result letter of this screening mammogram will be mailed directly to the patient.  RECOMMENDATION: Screening mammogram in one year. (Code:[id number]-B-01Y)  BI-RADS CATEGORY  1: Negative.   Electronically Signed   By: Lovey Newcomer M.D.   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 She remains independent and active. She will be due again for mammogram in October 2018. Current mammogram was reviewed with the patient. Results are noted above. She would like to continue with yearly follow-ups with [place].  Follow-up in 1 year with breast exam.  All questions were answered.  The patient knows to call the clinic with any problems, questions or concerns.  This document serves as a record of services personally performed by Ancil Linsey, MD. It was created on her behalf by Elmyra Ricks, a trained medical scribe. The creation of this record is based on the scribe's personal observations and the provider's statements to them. This document has been checked and approved by the attending provider.  I have reviewed the above documentation for accuracy and completeness, and I agree with the above.  This note was electronically signed.    Molli Hazard, MD  03/29/2016 11:09 AM

## 2016-03-29 NOTE — Patient Instructions (Signed)
[facility] at [facility] Discharge Instructions  RECOMMENDATIONS MADE BY THE CONSULTANT AND ANY TEST RESULTS WILL BE SENT TO YOUR REFERRING PHYSICIAN.  Return in 1 year  Mammogram prior to return visit in 1 year    Thank you for choosing [facility] at [facility] to provide your oncology and hematology care.  To afford each patient quality time with our provider, please arrive at least 15 minutes before your scheduled appointment time.   Beginning January 23rd 2017 lab work for the [facility] will be done in the  Main lab at [facility] on 1st floor. If you have a lab appointment with the [facility] please come in thru the  Main Entrance and check in at the main information desk  You need to re-schedule your appointment should you arrive 10 or more minutes late.  We strive to give you quality time with our providers, and arriving late affects you and other patients whose appointments are after yours.  Also, if you no show three or more times for appointments you may be dismissed from the clinic at the providers discretion.     Again, thank you for choosing [facility].  Our hope is that these requests will decrease the amount of time that you wait before being seen by our physicians.       _____________________________________________________________  Should you have questions after your visit to [facility], please contact our office at [phone] between the hours of 8:30 a.m. and 4:30 p.m.  Voicemails left after 4:30 p.m. will not be returned until the following business day.  For prescription refill requests, have your pharmacy contact our office.         Resources For Cancer Patients and their Caregivers ? American Cancer Society: Can assist with transportation, wigs, general needs, runs Look Good Feel Better.        820-562-3244 ? Cancer Care: Provides financial assistance, online support  groups, medication/co-pay assistance.  1-800-813-HOPE (330)763-2576) ? [facility] Assists [place] cancer patients and their families through emotional , educational and financial support.  (930)054-8846 ? [place] DSS Where to apply for food stamps, Medicaid and utility assistance. 786-472-9099 ? [facility]: Transportation to medical appointments. 726-112-1751 ? Social Security Administration: May apply for disability if have a Stage IV cancer. 573-301-2776 937 614 4765 ? [employer], Disability and Transit Services: Assists with nutrition, care and transit needs. [place] Support Programs: @10RELATIVEDAYS @ > Cancer Support Group  2nd Tuesday of the month 1pm-2pm, Journey Room  > Creative Journey  3rd Tuesday of the month 1130am-1pm, Journey Room  > Look Good Feel Better  1st Wednesday of the month 10am-12 noon, Journey Room (Call [place] to register [phone])

## 2016-04-06 DIAGNOSIS — Z85828 Personal history of other malignant neoplasm of skin: Secondary | ICD-10-CM | POA: Diagnosis not present

## 2016-04-06 DIAGNOSIS — Z08 Encounter for follow-up examination after completed treatment for malignant neoplasm: Secondary | ICD-10-CM | POA: Diagnosis not present

## 2016-04-14 ENCOUNTER — Encounter (HOSPITAL_COMMUNITY): Payer: Self-pay | Admitting: Hematology & Oncology

## 2016-04-18 DIAGNOSIS — E119 Type 2 diabetes mellitus without complications: Secondary | ICD-10-CM | POA: Diagnosis not present

## 2016-04-18 DIAGNOSIS — R945 Abnormal results of liver function studies: Secondary | ICD-10-CM | POA: Diagnosis not present

## 2016-04-18 DIAGNOSIS — Z79899 Other long term (current) drug therapy: Secondary | ICD-10-CM | POA: Diagnosis not present

## 2016-04-25 DIAGNOSIS — E1129 Type 2 diabetes mellitus with other diabetic kidney complication: Secondary | ICD-10-CM | POA: Diagnosis not present

## 2016-04-25 DIAGNOSIS — I10 Essential (primary) hypertension: Secondary | ICD-10-CM | POA: Diagnosis not present

## 2016-06-21 DIAGNOSIS — M4726 Other spondylosis with radiculopathy, lumbar region: Secondary | ICD-10-CM | POA: Diagnosis not present

## 2016-06-21 DIAGNOSIS — M5136 Other intervertebral disc degeneration, lumbar region: Secondary | ICD-10-CM | POA: Diagnosis not present

## 2016-06-21 DIAGNOSIS — M48062 Spinal stenosis, lumbar region with neurogenic claudication: Secondary | ICD-10-CM | POA: Diagnosis not present

## 2016-06-21 DIAGNOSIS — M5416 Radiculopathy, lumbar region: Secondary | ICD-10-CM | POA: Diagnosis not present

## 2016-07-04 DIAGNOSIS — J40 Bronchitis, not specified as acute or chronic: Secondary | ICD-10-CM | POA: Diagnosis not present

## 2016-07-04 DIAGNOSIS — J069 Acute upper respiratory infection, unspecified: Secondary | ICD-10-CM | POA: Diagnosis not present

## 2016-08-19 DIAGNOSIS — E119 Type 2 diabetes mellitus without complications: Secondary | ICD-10-CM | POA: Diagnosis not present

## 2016-08-29 DIAGNOSIS — Z6826 Body mass index (BMI) 26.0-26.9, adult: Secondary | ICD-10-CM | POA: Diagnosis not present

## 2016-08-29 DIAGNOSIS — E1129 Type 2 diabetes mellitus with other diabetic kidney complication: Secondary | ICD-10-CM | POA: Diagnosis not present

## 2016-08-29 DIAGNOSIS — I10 Essential (primary) hypertension: Secondary | ICD-10-CM | POA: Diagnosis not present

## 2016-12-19 DIAGNOSIS — E785 Hyperlipidemia, unspecified: Secondary | ICD-10-CM | POA: Diagnosis not present

## 2016-12-19 DIAGNOSIS — I10 Essential (primary) hypertension: Secondary | ICD-10-CM | POA: Diagnosis not present

## 2016-12-19 DIAGNOSIS — E119 Type 2 diabetes mellitus without complications: Secondary | ICD-10-CM | POA: Diagnosis not present

## 2016-12-19 DIAGNOSIS — Z79899 Other long term (current) drug therapy: Secondary | ICD-10-CM | POA: Diagnosis not present

## 2016-12-26 DIAGNOSIS — Z6826 Body mass index (BMI) 26.0-26.9, adult: Secondary | ICD-10-CM | POA: Diagnosis not present

## 2016-12-26 DIAGNOSIS — I10 Essential (primary) hypertension: Secondary | ICD-10-CM | POA: Diagnosis not present

## 2016-12-26 DIAGNOSIS — M545 Low back pain: Secondary | ICD-10-CM | POA: Diagnosis not present

## 2016-12-26 DIAGNOSIS — E1129 Type 2 diabetes mellitus with other diabetic kidney complication: Secondary | ICD-10-CM | POA: Diagnosis not present

## 2017-01-12 ENCOUNTER — Ambulatory Visit (HOSPITAL_COMMUNITY): Payer: Medicare Other | Attending: Internal Medicine

## 2017-01-12 ENCOUNTER — Encounter (HOSPITAL_COMMUNITY): Payer: Self-pay

## 2017-01-12 DIAGNOSIS — G8929 Other chronic pain: Secondary | ICD-10-CM | POA: Diagnosis not present

## 2017-01-12 DIAGNOSIS — M6281 Muscle weakness (generalized): Secondary | ICD-10-CM | POA: Diagnosis not present

## 2017-01-12 DIAGNOSIS — R29898 Other symptoms and signs involving the musculoskeletal system: Secondary | ICD-10-CM | POA: Diagnosis not present

## 2017-01-12 DIAGNOSIS — R293 Abnormal posture: Secondary | ICD-10-CM | POA: Diagnosis not present

## 2017-01-12 DIAGNOSIS — M5441 Lumbago with sciatica, right side: Secondary | ICD-10-CM | POA: Diagnosis not present

## 2017-01-12 NOTE — Therapy (Signed)
[place], [place], [address] Phone: [phone]   Fax:  [phone]  Physical Therapy Evaluation  Patient Details  Name: Christine Sutton MRN: [id number] Date of Birth: 11/28/1929 Referring Provider: Asencion Noble, MD  Encounter Date: 01/12/2017      PT End of Session - 01/12/17 1159    Visit Number 1   Number of Visits 13   Date for PT Re-Evaluation 02/02/17   Authorization Type Medicare Part A and B   Authorization Time Period 01/12/17 to 02/23/17   Authorization - Visit Number 1   Authorization - Number of Visits 10   PT Start Time 1118   PT Stop Time 1201   PT Time Calculation (min) 43 min   Activity Tolerance Patient tolerated treatment well   Behavior During Therapy [facility] for tasks assessed/performed      Past Medical History:  Diagnosis Date  . Diabetes mellitus ([place])   . High blood cholesterol level   . HX: breast cancer    left  . Hypertension   . Invasive ductal carcinoma of left breast ([place]) 01/27/2014   ER +    Past Surgical History:  Procedure Laterality Date  . BACK SURGERY     lower  . CATARACT EXTRACTION, BILATERAL  11/2014  . MASTECTOMY Left     There were no vitals filed for this visit.       Subjective Assessment - 01/12/17 1121    Subjective Pt states that she has been having LBP for years. She had back surgery in 2011 which helped for a while and then she began having problems again. She had a previous bout of physical therapy about 2 years ago for the same issue. She states that she did not keep up with her exercises like she should have. She said that sitting is her biggest issue; she will get spasms if she sits for very long. She states she has to limit her chores at home and take breaks due to the pain. She states that her pain is located across her lower back and has radicular symptoms down into her RLE to her toes. The radicular symptoms come on after she has sat for a long time or  after she has done a lot of house work. She denies any b/b changes or falls, but she does feel like her R leg is weaker. She states that her pain gets worse as the day goes on.    Pertinent History DM, HTN, h/o back surgery in 2011, prior PT bout for same issue   Limitations House hold activities;Sitting   How long can you sit comfortably? 1 hour or <   How long can you stand comfortably? 15 mins   How long can you walk comfortably? about 15 mins   Patient Stated Goals sit longer   Currently in Pain? Yes   Pain Score 3    Pain Location Back   Pain Orientation Lower   Pain Descriptors / Indicators Aching;Dull   Pain Type Chronic pain   Pain Onset More than a month ago   Pain Frequency Constant   Aggravating Factors  sitting for long periods of time, standing   Pain Relieving Factors staying moving, ice and heat   Effect of Pain on Daily Activities increases            [facility] PT Assessment - 01/12/17 0001      Assessment   Medical Diagnosis LBP   Referring  Provider Asencion Noble, MD   Onset Date/Surgical Date --  chronic; had LBP for years   Next MD Visit 05/14/17  about 4 months   Prior Therapy yes for same issue 2 year ago     Precautions   Precautions None     Restrictions   Weight Bearing Restrictions No     Balance Screen   Has the patient fallen in the past 6 months No   Has the patient had a decrease in activity level because of a fear of falling?  No   Is the patient reluctant to leave their home because of a fear of falling?  No     Prior Function   Level of Independence Independent   Vocation Retired   Leisure church and activities at [employer]     Observation/Other Assessments   Observations Scoliosis (R side higher than L when bending forward)   Focus on Therapeutic Outcomes (FOTO)  40% limited     Sensation   Light Touch Appears Intact     Posture/Postural Control   Posture/Postural Control Postural limitations   Postural Limitations Rounded  Shoulders;Forward head;Increased thoracic kyphosis;Anterior pelvic tilt   Posture Comments anterior pelvic tilt in standing/walking     ROM / Strength   AROM / PROM / Strength AROM;Strength     AROM   AROM Assessment Site Lumbar   Lumbar Flexion WNL, pain at end range; RFIS x10 reps no change in radicular symptoms, increased LBP   Lumbar Extension min limitations, REIS x 10 reps no change     Strength   Right Hip Flexion 4+/5   Right Hip Extension 4/5   Right Hip ABduction 4+/5   Left Hip Flexion 5/5   Left Hip Extension 4/5   Left Hip ABduction 4/5   Right Knee Flexion 4+/5   Right Knee Extension 5/5   Left Knee Flexion 5/5   Left Knee Extension 5/5   Right Ankle Dorsiflexion 4+/5   Left Ankle Dorsiflexion 5/5     Flexibility   Soft Tissue Assessment /Muscle Length yes   Hamstrings tight BLE, did not recreate LBP   Quadriceps +Ely's BLE     Palpation   Spinal mobility mild hypomobility of lumbar spine   Palpation comment increased soft tissue restrictions of bil lumbar paraspinals, R>L, with increased tenderness to palpation on the R; pt had palpable trigger points throughout the R paraspinals     Special Tests    Special Tests Leg LengthTest;Lumbar   Lumbar Tests Straight Leg Raise   Leg length test  other     Straight Leg Raise   Findings Negative   Side  Right   Comment neg BLE     other    Length minimal difference in LE (had pt bridge and passive lowering of LEs)     Ambulation/Gait   Ambulation Distance (Feet) 678 Feet  3MWT   Assistive device None   Gait Pattern Step-through pattern;Within Functional Limits     Balance   Balance Assessed Yes     Static Standing Balance   Static Standing - Balance Support No upper extremity supported   Static Standing Balance -  Activities  Single Leg Stance - Right Leg;Single Leg Stance - Left Leg   Static Standing - Comment/# of Minutes 4 sec or < on BLE     Standardized Balance Assessment   Standardized Balance  Assessment Five Times Sit to Stand   Five times sit to stand comments  19  sec from chair, no UE         Objective measurements completed on examination: See above findings.           PT Short Term Goals - 01/12/17 1233      PT SHORT TERM GOAL #1   Title Pt will be independent with HEP and perform consistently in order to maximize return to PLOF.   Time 3   Period Weeks   Target Date 02/02/17     PT SHORT TERM GOAL #2   Title Pt will have improved MMT to 5/5 of all tested muscle groups to decrease pain and improve functional tasks.   Time 3   Period Weeks   Status New     PT SHORT TERM GOAL #3   Title Pt will have improved 5xSTS to 10 sec or < with no UE to demonstrate improved functional strength.   Time 3   Period Weeks   Status New           PT Long Term Goals - 01/12/17 1235      PT LONG TERM GOAL #1   Title Pt will have improved bil SLS to at least 10 sec with no UE support to maximize gait and demonstrate improved overall function.   Time 6   Period Weeks   Status New   Target Date 02/23/17     PT LONG TERM GOAL #2   Title Pt will report an improved sitting tolerance to at least 1 hour or > to maximize her ability to participate in events at her church.   Time 6   Period Weeks   Status New     PT LONG TERM GOAL #3   Title Pt will report an improved standing tolerance to at least 30 mins or > to maximize her ability to cook meals at home.   Time 6   Period Weeks   Status New     PT LONG TERM GOAL #4   Title Pt will report an improved tolerance to walking to at least 30 mins or > to demonstrate improved overall function and allow her to go shopping with greater ease.   Time 6   Period Weeks   Status New                Plan - 01/12/17 1212    Clinical Impression Statement Pt is pleasant 83 YO F who presents to OPPT with c/o chronic LBP with intermittent R radicular symptoms. Pt has deficits in lumbar ROM, proximal hip strength, balance,  flexibility and functional strength, as well as difficulty performing functional tasks at home and in the community. Pt has h/o scoliosis, which she states she had sugery to correct her curve in 2011; she still has R convex curve of lower thoracic spine (R side higher than L). Pt also stated that she has LLD (R shorter than L) but when assessed, PT only noted minimal difference. However, during 3MWT, pt noted to have L hip higher than the R. Pt needs skilled PT intervention to maximize overall function and promote return to PLOF.   History and Personal Factors relevant to plan of care: chronicity of issue, prior therapy for same issue, motivated   Clinical Presentation Stable   Clinical Presentation due to: MMT, balance, ROM, 5xSTS   Clinical Decision Making Low   Rehab Potential Fair   PT Frequency 2x / week   PT Duration 6 weeks   PT Treatment/Interventions ADLs/Self Care Home Management;Cryotherapy;Dealer  Stimulation;Moist Heat;Gait training;Stair training;Functional mobility training;Therapeutic activities;Therapeutic exercise;Balance training;Neuromuscular re-education;Cognitive remediation;Patient/family education;Manual techniques;Passive range of motion;Dry needling;Taping   PT Next Visit Plan review goals and HEP, manual to lumbar paraspinals for soft tissue restrictions, quad stretching, BLE strengthening, functional strengthening, postural strengthening   PT Home Exercise Plan eval: SKTC, HS stretch   Consulted and Agree with Plan of Care Patient      Patient will benefit from skilled therapeutic intervention in order to improve the following deficits and impairments:  Decreased balance, Decreased range of motion, Decreased strength, Hypomobility, Increased muscle spasms, Impaired flexibility, Improper body mechanics, Postural dysfunction, Pain  Visit Diagnosis: Chronic bilateral low back pain with right-sided sciatica - Plan: PT plan of care cert/re-cert  Muscle weakness  (generalized) - Plan: PT plan of care cert/re-cert  Other symptoms and signs involving the musculoskeletal system - Plan: PT plan of care cert/re-cert  Abnormal posture - Plan: PT plan of care cert/re-cert      G-Codes - 01/75/10 1302    Functional Assessment Tool Used (Outpatient Only) FOTO, clinical judgement, 5xSTS, MMT, SLS   Functional Limitation Mobility: Walking and moving around   Mobility: Walking and Moving Around Current Status (C5852) At least 40 percent but less than 60 percent impaired, limited or restricted   Mobility: Walking and Moving Around Goal Status (D7824) At least 1 percent but less than 20 percent impaired, limited or restricted       Problem List Patient Active Problem List   Diagnosis Date Noted  . Invasive ductal carcinoma of left breast ([place]) 01/27/2014  . Diabetes mellitus due to underlying condition without complications ([place]) 23/53/6144  . Hypertension, essential, benign 12/24/2013      Geraldine Solar PT, DPT   [facility] [address] [place], [place], [address] Phone: [phone]   Fax:  [phone]  Name: Christine Sutton MRN: [id number] Date of Birth: August 02, 1929

## 2017-01-12 NOTE — Patient Instructions (Signed)
  SINGLE KNEE TO CHEST STRETCH - [place]  While Lying on your back, hold your knee and gently pull it up towards your chest.  Perform 1x/day 3-5 stretches on each leg, holding for 30-60 seconds   HAMSTRING STRETCH WITH TOWEL  While lying down on your back, hook a towel or strap under  your foot and draw up your leg until a stretch is felt under your leg. calf area.  Keep your knee in a straightened position during the stretch.  Perform 1x/day 3-5 stretches on each leg, holding for 30-60 seconds

## 2017-01-17 ENCOUNTER — Ambulatory Visit (HOSPITAL_COMMUNITY): Payer: Medicare Other | Admitting: Physical Therapy

## 2017-01-17 DIAGNOSIS — R293 Abnormal posture: Secondary | ICD-10-CM

## 2017-01-17 DIAGNOSIS — G8929 Other chronic pain: Secondary | ICD-10-CM | POA: Diagnosis not present

## 2017-01-17 DIAGNOSIS — M5441 Lumbago with sciatica, right side: Secondary | ICD-10-CM

## 2017-01-17 DIAGNOSIS — R29898 Other symptoms and signs involving the musculoskeletal system: Secondary | ICD-10-CM | POA: Diagnosis not present

## 2017-01-17 DIAGNOSIS — E119 Type 2 diabetes mellitus without complications: Secondary | ICD-10-CM | POA: Diagnosis not present

## 2017-01-17 DIAGNOSIS — M6281 Muscle weakness (generalized): Secondary | ICD-10-CM | POA: Diagnosis not present

## 2017-01-17 DIAGNOSIS — Z961 Presence of intraocular lens: Secondary | ICD-10-CM | POA: Diagnosis not present

## 2017-01-17 NOTE — Therapy (Signed)
[place], [place], [address] Phone: [phone]   Fax:  [phone]  Physical Therapy Treatment  Patient Details  Name: Christine Sutton MRN: [id number] Date of Birth: [birth year] Referring Provider: Asencion Noble, MD  Encounter Date: 01/17/2017      PT End of Session - 01/17/17 1723    Visit Number 2   Number of Visits 13   Date for PT Re-Evaluation 02/02/17   Authorization Type Medicare Part A and B   Authorization Time Period 01/12/17 to 02/23/17   Authorization - Visit Number 2   Authorization - Number of Visits 10   PT Start Time 1608   PT Stop Time 1650   PT Time Calculation (min) 42 min   Activity Tolerance Patient tolerated treatment well   Behavior During Therapy [facility] for tasks assessed/performed      Past Medical History:  Diagnosis Date  . Diabetes mellitus ([place])   . High blood cholesterol level   . HX: breast cancer    left  . Hypertension   . Invasive ductal carcinoma of left breast ([place]) 01/27/2014   ER +    Past Surgical History:  Procedure Laterality Date  . BACK SURGERY     lower  . CATARACT EXTRACTION, BILATERAL  11/2014  . MASTECTOMY Left     There were no vitals filed for this visit.      Subjective Assessment - 01/17/17 1610    Subjective Pt states she knows she would not be back here if she had kept up her exercises. Currently 5/10 since she sat for a couple hours at a meeting.  States she is currently tired as well.  Reports complaince with her HEP and done them this morining.    Currently in Pain? Yes   Pain Score 5    Pain Location Back   Pain Orientation Lower   Pain Descriptors / Indicators Aching   Pain Radiating Towards no radiating pain.                         [facility] Adult PT Treatment/Exercise - 01/17/17 0001      Knee/Hip Exercises: Stretches   Active Hamstring Stretch Both;5 reps;20 seconds   Active Hamstring Stretch Limitations supine with UE's  behind thigh   Piriformis Stretch Both;3 reps;30 seconds   Piriformis Stretch Limitations seated    Other Knee/Hip Stretches single knee to chest 5X 20"     Knee/Hip Exercises: Seated   Sit to Sand 10 reps;without UE support     Knee/Hip Exercises: Supine   Bridges 10 reps   Straight Leg Raises 10 reps;Both     Manual Therapy   Manual Therapy Muscle Energy Technique   Manual therapy comments muscle energy technique for Lt anterior rotation   Muscle Energy Technique ambulation 226' following MET                PT Education - 01/17/17 1740    Education provided Yes   Education Details goals and HEP reviewed   Person(s) Educated Patient   Methods Explanation;Tactile cues;Verbal cues;Handout;Demonstration   Comprehension Verbalized understanding;Returned demonstration;Verbal cues required;Tactile cues required          PT Short Term Goals - 01/12/17 1233      PT SHORT TERM GOAL #1   Title Pt will be independent with HEP and perform consistently in order to maximize return to PLOF.   Time 3  Period Weeks   Target Date 02/02/17     PT SHORT TERM GOAL #2   Title Pt will have improved MMT to 5/5 of all tested muscle groups to decrease pain and improve functional tasks.   Time 3   Period Weeks   Status New     PT SHORT TERM GOAL #3   Title Pt will have improved 5xSTS to 10 sec or < with no UE to demonstrate improved functional strength.   Time 3   Period Weeks   Status New           PT Long Term Goals - 01/12/17 1235      PT LONG TERM GOAL #1   Title Pt will have improved bil SLS to at least 10 sec with no UE support to maximize gait and demonstrate improved overall function.   Time 6   Period Weeks   Status New   Target Date 02/23/17     PT LONG TERM GOAL #2   Title Pt will report an improved sitting tolerance to at least 1 hour or > to maximize her ability to participate in events at her church.   Time 6   Period Weeks   Status New     PT LONG  TERM GOAL #3   Title Pt will report an improved standing tolerance to at least 30 mins or > to maximize her ability to cook meals at home.   Time 6   Period Weeks   Status New     PT LONG TERM GOAL #4   Title Pt will report an improved tolerance to walking to at least 30 mins or > to demonstrate improved overall function and allow her to go shopping with greater ease.   Time 6   Period Weeks   Status New               Plan - 01/17/17 1723    Clinical Impression Statement reviewed HEP and goals per intial evaluation.   Pt reports she found her old HEP and has been doing some of these.  Checked SI for malalignment and noted Rt anterior rotation.  MET completed with good results and patient reported being pain free at EOS.  Began STS and glute strengthening.     Rehab Potential Fair   PT Frequency 2x / week   PT Duration 6 weeks   PT Treatment/Interventions ADLs/Self Care Home Management;Cryotherapy;Electrical Stimulation;Moist Heat;Gait training;Stair training;Functional mobility training;Therapeutic activities;Therapeutic exercise;Balance training;Neuromuscular re-education;Cognitive remediation;Patient/family education;Manual techniques;Passive range of motion;Dry needling;Taping   PT Next Visit Plan Begin manual to lumbar paraspinals for soft tissue restrictions if needed.  Continue with BLE strengthening.  Begin standing therex (hip ext/abd, march), gastroc stretching, SLS.  Progress functional strengthening and begin postural strengthening   PT Home Exercise Plan eval: SKTC, HS stretch   Consulted and Agree with Plan of Care Patient      Patient will benefit from skilled therapeutic intervention in order to improve the following deficits and impairments:  Decreased balance, Decreased range of motion, Decreased strength, Hypomobility, Increased muscle spasms, Impaired flexibility, Improper body mechanics, Postural dysfunction, Pain  Visit Diagnosis: Chronic bilateral low back  pain with right-sided sciatica  Muscle weakness (generalized)  Other symptoms and signs involving the musculoskeletal system  Abnormal posture     Problem List Patient Active Problem List   Diagnosis Date Noted  . Invasive ductal carcinoma of left breast ([place]) 01/27/2014  . Diabetes mellitus due to underlying condition without complications ([place])  12/24/2013  . Hypertension, essential, benign 12/24/2013   Christine Sutton, PTA/CLT [phone]  Christine Sutton 01/17/2017, 5:41 PM  [place] [address] [place], [place], [address] Phone: [phone]   Fax:  [phone]  Name: Christine Sutton MRN: [id number] Date of Birth: [birth year]

## 2017-01-19 ENCOUNTER — Ambulatory Visit (HOSPITAL_COMMUNITY): Payer: Medicare Other | Admitting: Physical Therapy

## 2017-01-19 DIAGNOSIS — M6281 Muscle weakness (generalized): Secondary | ICD-10-CM

## 2017-01-19 DIAGNOSIS — G8929 Other chronic pain: Secondary | ICD-10-CM | POA: Diagnosis not present

## 2017-01-19 DIAGNOSIS — R293 Abnormal posture: Secondary | ICD-10-CM | POA: Diagnosis not present

## 2017-01-19 DIAGNOSIS — M5441 Lumbago with sciatica, right side: Secondary | ICD-10-CM

## 2017-01-19 DIAGNOSIS — R29898 Other symptoms and signs involving the musculoskeletal system: Secondary | ICD-10-CM | POA: Diagnosis not present

## 2017-01-19 NOTE — Therapy (Signed)
[place], [place], [address] Phone: [phone]   Fax:  [phone]  Physical Therapy Treatment  Patient Details  Name: AHLANI WICKES MRN: [id number] Date of Birth: August 02, 1929 Referring Provider: Asencion Noble, MD  Encounter Date: 01/19/2017      PT End of Session - 01/19/17 1702    Visit Number 3   Number of Visits 13   Date for PT Re-Evaluation 02/02/17   Authorization Type Medicare Part A and B   Authorization Time Period 01/12/17 to 02/23/17   Authorization - Visit Number 3   Authorization - Number of Visits 10   PT Start Time 2505   PT Stop Time 1728   PT Time Calculation (min) 40 min   Activity Tolerance Patient tolerated treatment well   Behavior During Therapy [facility] for tasks assessed/performed      Past Medical History:  Diagnosis Date  . Diabetes mellitus ([place])   . High blood cholesterol level   . HX: breast cancer    left  . Hypertension   . Invasive ductal carcinoma of left breast ([place]) 01/27/2014   ER +    Past Surgical History:  Procedure Laterality Date  . BACK SURGERY     lower  . CATARACT EXTRACTION, BILATERAL  11/2014  . MASTECTOMY Left     There were no vitals filed for this visit.      Subjective Assessment - 01/19/17 1659    Subjective Pt states she is sore today from new exercises across her lower back and legs.    4/10 pain today.                         [facility] Adult PT Treatment/Exercise - 01/19/17 0001      Knee/Hip Exercises: Stretches   Active Hamstring Stretch Both;5 reps;20 seconds   Active Hamstring Stretch Limitations supine with UE's behind thigh   Piriformis Stretch Both;30 seconds;1 rep   Piriformis Stretch Limitations seated    Other Knee/Hip Stretches single knee to chest 3X 20" each     Knee/Hip Exercises: Seated   Sit to Sand 10 reps;without UE support     Knee/Hip Exercises: Supine   Bridges 10 reps   Straight Leg Raises 10 reps;Both      Manual Therapy   Manual Therapy Soft tissue mobilization   Manual therapy comments completed seperately from all other skilled interventions at EOS   Soft tissue mobilization prone to lumbar paraspinals   Muscle Energy Technique not needed this session                  PT Short Term Goals - 01/12/17 1233      PT SHORT TERM GOAL #1   Title Pt will be independent with HEP and perform consistently in order to maximize return to PLOF.   Time 3   Period Weeks   Target Date 02/02/17     PT SHORT TERM GOAL #2   Title Pt will have improved MMT to 5/5 of all tested muscle groups to decrease pain and improve functional tasks.   Time 3   Period Weeks   Status New     PT SHORT TERM GOAL #3   Title Pt will have improved 5xSTS to 10 sec or < with no UE to demonstrate improved functional strength.   Time 3   Period Weeks   Status New  PT Long Term Goals - 01/12/17 1235      PT LONG TERM GOAL #1   Title Pt will have improved bil SLS to at least 10 sec with no UE support to maximize gait and demonstrate improved overall function.   Time 6   Period Weeks   Status New   Target Date 02/23/17     PT LONG TERM GOAL #2   Title Pt will report an improved sitting tolerance to at least 1 hour or > to maximize her ability to participate in events at her church.   Time 6   Period Weeks   Status New     PT LONG TERM GOAL #3   Title Pt will report an improved standing tolerance to at least 30 mins or > to maximize her ability to cook meals at home.   Time 6   Period Weeks   Status New     PT LONG TERM GOAL #4   Title Pt will report an improved tolerance to walking to at least 30 mins or > to demonstrate improved overall function and allow her to go shopping with greater ease.   Time 6   Period Weeks   Status New               Plan - 01/19/17 1730    Clinical Impression Statement PT with soreness from beginning exercise program.  No new exercises added  this session.  Continued with established exercises with cues for form.  Pt requires assistance with hold times and repetition count.  MET not needed this session.  Completed manual soft tissue mobilization for lumbar paraspinals.  Noted tightness bilaterally but with most symptoms on Right.  Pt reported being painfree at EOS.   Rehab Potential Fair   PT Frequency 2x / week   PT Duration 6 weeks   PT Treatment/Interventions ADLs/Self Care Home Management;Cryotherapy;Electrical Stimulation;Moist Heat;Gait training;Stair training;Functional mobility training;Therapeutic activities;Therapeutic exercise;Balance training;Neuromuscular re-education;Cognitive remediation;Patient/family education;Manual techniques;Passive range of motion;Dry needling;Taping   PT Next Visit Plan continue manual to lumbar paraspinals for soft tissue restrictions if needed.  Continue with BLE strengthening.  Begin standing therex (hip ext/abd, march), gastroc stretching, SLS if soreness if subsided.  Progress functional strengthening and begin postural strengthening   PT Home Exercise Plan eval: SKTC, HS stretch   Consulted and Agree with Plan of Care Patient      Patient will benefit from skilled therapeutic intervention in order to improve the following deficits and impairments:  Decreased balance, Decreased range of motion, Decreased strength, Hypomobility, Increased muscle spasms, Impaired flexibility, Improper body mechanics, Postural dysfunction, Pain  Visit Diagnosis: Chronic bilateral low back pain with right-sided sciatica  Muscle weakness (generalized)  Other symptoms and signs involving the musculoskeletal system  Abnormal posture     Problem List Patient Active Problem List   Diagnosis Date Noted  . Invasive ductal carcinoma of left breast ([place]) 01/27/2014  . Diabetes mellitus due to underlying condition without complications ([place]) 28/31/5176  . Hypertension, essential, benign 12/24/2013   Teena Irani, PTA/CLT [phone]  Teena Irani 01/19/2017, 5:31 PM  [place] [address] [place], [place], [address] Phone: [phone]   Fax:  [phone]  Name: Christine Sutton MRN: [id number] Date of Birth: 1930-05-08

## 2017-01-23 ENCOUNTER — Ambulatory Visit (HOSPITAL_COMMUNITY): Payer: Medicare Other | Admitting: Physical Therapy

## 2017-01-23 DIAGNOSIS — M6281 Muscle weakness (generalized): Secondary | ICD-10-CM

## 2017-01-23 DIAGNOSIS — M5441 Lumbago with sciatica, right side: Secondary | ICD-10-CM | POA: Diagnosis not present

## 2017-01-23 DIAGNOSIS — R293 Abnormal posture: Secondary | ICD-10-CM | POA: Diagnosis not present

## 2017-01-23 DIAGNOSIS — G8929 Other chronic pain: Secondary | ICD-10-CM

## 2017-01-23 DIAGNOSIS — R29898 Other symptoms and signs involving the musculoskeletal system: Secondary | ICD-10-CM

## 2017-01-23 NOTE — Therapy (Signed)
[place], [place], [address] Phone: [phone]   Fax:  [phone]  Physical Therapy Treatment  Patient Details  Name: Christine Sutton MRN: [id number] Date of Birth: 01-08-1930 Referring Provider: Asencion Noble, MD  Encounter Date: 01/23/2017      PT End of Session - 01/23/17 0823    Visit Number 4   Number of Visits 13   Date for PT Re-Evaluation 02/02/17   Authorization Type Medicare Part A and B   Authorization Time Period 01/12/17 to 02/23/17   Authorization - Visit Number 4   Authorization - Number of Visits 10   PT Start Time 0815   PT Stop Time 0858   PT Time Calculation (min) 43 min   Activity Tolerance Patient tolerated treatment well   Behavior During Therapy [facility] for tasks assessed/performed      Past Medical History:  Diagnosis Date  . Diabetes mellitus ([place])   . High blood cholesterol level   . HX: breast cancer    left  . Hypertension   . Invasive ductal carcinoma of left breast ([place]) 01/27/2014   ER +    Past Surgical History:  Procedure Laterality Date  . BACK SURGERY     lower  . CATARACT EXTRACTION, BILATERAL  11/2014  . MASTECTOMY Left     There were no vitals filed for this visit.      Subjective Assessment - 01/23/17 0820    Subjective Pt reports that she continues to be sore with activity and following her massage last session. None while just sitting here.    Currently in Pain? No/denies                         [facility] Adult PT Treatment/Exercise - 01/23/17 0001      Knee/Hip Exercises: Stretches   Piriformis Stretch 2 reps;Both;30 seconds   Piriformis Stretch Limitations supine    Other Knee/Hip Stretches seated trunk rotation stretch 5x10"      Knee/Hip Exercises: Standing   Wall Squat 3 sets;5 reps     Knee/Hip Exercises: Seated   Other Seated Knee/Hip Exercises lateral flexion with UE reach and hold x10 reps, 5 sec hold each side; seated trunk  rotation with red TB 2x10 reps      Knee/Hip Exercises: Supine   Other Supine Knee/Hip Exercises low trunk rotation Lt and Rt x15 reps each                 PT Education - 01/23/17 0824    Education provided Yes   Education Details reminded pt that increased muscle soreness is expected with all of the exercises we are completing and encouraged her to perform her stretches and HEP more regularly to decrease this soreness.    Person(s) Educated Patient   Methods Explanation   Comprehension Verbalized understanding          PT Short Term Goals - 01/12/17 1233      PT SHORT TERM GOAL #1   Title Pt will be independent with HEP and perform consistently in order to maximize return to PLOF.   Time 3   Period Weeks   Target Date 02/02/17     PT SHORT TERM GOAL #2   Title Pt will have improved MMT to 5/5 of all tested muscle groups to decrease pain and improve functional tasks.   Time 3   Period Weeks   Status New  PT SHORT TERM GOAL #3   Title Pt will have improved 5xSTS to 10 sec or < with no UE to demonstrate improved functional strength.   Time 3   Period Weeks   Status New           PT Long Term Goals - 01/12/17 1235      PT LONG TERM GOAL #1   Title Pt will have improved bil SLS to at least 10 sec with no UE support to maximize gait and demonstrate improved overall function.   Time 6   Period Weeks   Status New   Target Date 02/23/17     PT LONG TERM GOAL #2   Title Pt will report an improved sitting tolerance to at least 1 hour or > to maximize her ability to participate in events at her church.   Time 6   Period Weeks   Status New     PT LONG TERM GOAL #3   Title Pt will report an improved standing tolerance to at least 30 mins or > to maximize her ability to cook meals at home.   Time 6   Period Weeks   Status New     PT LONG TERM GOAL #4   Title Pt will report an improved tolerance to walking to at least 30 mins or > to demonstrate improved  overall function and allow her to go shopping with greater ease.   Time 6   Period Weeks   Status New               Plan - 01/23/17 5643    Clinical Impression Statement Pt continues to report soreness with completing her exercises. Session focused on therex to promote trunk mobility and increase LE strength. Pt reporting muscle fatigue with wall slides and several other strengthening exercises completed during today's session. Ended session with report of fatigue but no increase in pain/soreness. Therapist educated pt on the expectations of muscle soreness following exercise and she seemed to have better understanding of this concept by the end of today's session.   Rehab Potential Fair   PT Frequency 2x / week   PT Duration 6 weeks   PT Treatment/Interventions ADLs/Self Care Home Management;Cryotherapy;Electrical Stimulation;Moist Heat;Gait training;Stair training;Functional mobility training;Therapeutic activities;Therapeutic exercise;Balance training;Neuromuscular re-education;Cognitive remediation;Patient/family education;Manual techniques;Passive range of motion;Dry needling;Taping   PT Next Visit Plan continue manual to lumbar paraspinals for soft tissue restrictions if needed. promote gentle AROM of the trunk.  UE pressdown with hip flexion hold    PT Home Exercise Plan eval: SKTC, HS stretch, wall slide   Consulted and Agree with Plan of Care Patient      Patient will benefit from skilled therapeutic intervention in order to improve the following deficits and impairments:  Decreased balance, Decreased range of motion, Decreased strength, Hypomobility, Increased muscle spasms, Impaired flexibility, Improper body mechanics, Postural dysfunction, Pain  Visit Diagnosis: Chronic bilateral low back pain with right-sided sciatica  Muscle weakness (generalized)  Other symptoms and signs involving the musculoskeletal system  Abnormal posture     Problem List Patient Active  Problem List   Diagnosis Date Noted  . Invasive ductal carcinoma of left breast ([place]) 01/27/2014  . Diabetes mellitus due to underlying condition without complications ([place]) 32/95/1884  . Hypertension, essential, benign 12/24/2013   9:03 AM,01/23/17 Elly Modena PT, DPT [facility] [place] [address] [place], [place], [address] Phone: [phone]   Fax:  [phone]  Name: Christine Sutton MRN: [id number] Date of Birth: 31-Dec-1929

## 2017-01-27 ENCOUNTER — Ambulatory Visit (HOSPITAL_COMMUNITY): Payer: Medicare Other | Admitting: Physical Therapy

## 2017-01-27 DIAGNOSIS — R293 Abnormal posture: Secondary | ICD-10-CM | POA: Diagnosis not present

## 2017-01-27 DIAGNOSIS — M5441 Lumbago with sciatica, right side: Secondary | ICD-10-CM

## 2017-01-27 DIAGNOSIS — G8929 Other chronic pain: Secondary | ICD-10-CM | POA: Diagnosis not present

## 2017-01-27 DIAGNOSIS — R29898 Other symptoms and signs involving the musculoskeletal system: Secondary | ICD-10-CM

## 2017-01-27 DIAGNOSIS — M6281 Muscle weakness (generalized): Secondary | ICD-10-CM | POA: Diagnosis not present

## 2017-01-27 NOTE — Therapy (Signed)
[place], [place], [address] Phone: [phone]   Fax:  [phone]  Physical Therapy Treatment  Patient Details  Name: Christine Sutton MRN: [id number] Date of Birth: 1929/11/16 Referring Provider: Asencion Noble, MD  Encounter Date: 01/27/2017      PT End of Session - 01/27/17 1451    Visit Number 5   Number of Visits 13   Date for PT Re-Evaluation 02/02/17   Authorization Type Medicare Part A and B   Authorization Time Period 01/12/17 to 02/23/17   Authorization - Visit Number 5   Authorization - Number of Visits 10   PT Start Time 1430   PT Stop Time 1512   PT Time Calculation (min) 42 min   Activity Tolerance Patient tolerated treatment well;No increased pain   Behavior During Therapy WFL for tasks assessed/performed      Past Medical History:  Diagnosis Date  . Diabetes mellitus ([place])   . High blood cholesterol level   . HX: breast cancer    left  . Hypertension   . Invasive ductal carcinoma of left breast ([place]) 01/27/2014   ER +    Past Surgical History:  Procedure Laterality Date  . BACK SURGERY     lower  . CATARACT EXTRACTION, BILATERAL  11/2014  . MASTECTOMY Left     There were no vitals filed for this visit.      Subjective Assessment - 01/27/17 1432    Subjective Pt reports that she continues to be sore with activity and following her massage last session. None while just sitting here.                          [facility] Adult PT Treatment/Exercise - 01/27/17 0001      Knee/Hip Exercises: Stretches   Quad Stretch Both;4 reps;30 seconds   Quad Stretch Limitations prone      Knee/Hip Exercises: Supine   Bridges 2 sets;10 reps;Both   Bridges Limitations blue TB around knees, 2nd set with hips elevated on foam     Other Supine Knee/Hip Exercises BUE pressdown with bent knee raise x15 reps each    Other Supine Knee/Hip Exercises low trunk rotation with LE on physioball x10  reps Lt/Rt      Manual Therapy   Manual Therapy --   Manual therapy comments separate rest of session    Soft tissue mobilization Prone, STM along Rt lumbar paraspinals    Passive ROM B hamstring stretch 2x30 sec 90/90                PT Education - 01/27/17 1448    Education provided Yes   Education Details reviewed pt's initial POC; technique with therex   Person(s) Educated Patient   Methods Explanation   Comprehension Verbalized understanding          PT Short Term Goals - 01/12/17 1233      PT SHORT TERM GOAL #1   Title Pt will be independent with HEP and perform consistently in order to maximize return to PLOF.   Time 3   Period Weeks   Target Date 02/02/17     PT SHORT TERM GOAL #2   Title Pt will have improved MMT to 5/5 of all tested muscle groups to decrease pain and improve functional tasks.   Time 3   Period Weeks   Status New     PT SHORT TERM GOAL #3  Title Pt will have improved 5xSTS to 10 sec or < with no UE to demonstrate improved functional strength.   Time 3   Period Weeks   Status New           PT Long Term Goals - 01/12/17 1235      PT LONG TERM GOAL #1   Title Pt will have improved bil SLS to at least 10 sec with no UE support to maximize gait and demonstrate improved overall function.   Time 6   Period Weeks   Status New   Target Date 02/23/17     PT LONG TERM GOAL #2   Title Pt will report an improved sitting tolerance to at least 1 hour or > to maximize her ability to participate in events at her church.   Time 6   Period Weeks   Status New     PT LONG TERM GOAL #3   Title Pt will report an improved standing tolerance to at least 30 mins or > to maximize her ability to cook meals at home.   Time 6   Period Weeks   Status New     PT LONG TERM GOAL #4   Title Pt will report an improved tolerance to walking to at least 30 mins or > to demonstrate improved overall function and allow her to go shopping with greater ease.    Time 6   Period Weeks   Status New               Plan - 01/27/17 1528    Clinical Impression Statement Continued with therex to improve mobility and LE strength. Pt requiring verbal cues to improve her technique with several exercises, and had to make adjustments to bridging in order to address end range hip extension. Pt reported no pain during or following today's session. Therapist educated her on recommended sets and reps with her wall slides, to decrease soreness found with this exercise. She verbalized understanding of this.    Rehab Potential Fair   PT Frequency 2x / week   PT Duration 6 weeks   PT Treatment/Interventions ADLs/Self Care Home Management;Cryotherapy;Electrical Stimulation;Moist Heat;Gait training;Stair training;Functional mobility training;Therapeutic activities;Therapeutic exercise;Balance training;Neuromuscular re-education;Cognitive remediation;Patient/family education;Manual techniques;Passive range of motion;Dry needling;Taping   PT Next Visit Plan promote gentle AROM of the trunk. hip extensor strengthening exercise, quad stretch    PT Home Exercise Plan eval: SKTC, HS stretch, wall slide   Consulted and Agree with Plan of Care Patient      Patient will benefit from skilled therapeutic intervention in order to improve the following deficits and impairments:  Decreased balance, Decreased range of motion, Decreased strength, Hypomobility, Increased muscle spasms, Impaired flexibility, Improper body mechanics, Postural dysfunction, Pain  Visit Diagnosis: Chronic bilateral low back pain with right-sided sciatica  Muscle weakness (generalized)  Other symptoms and signs involving the musculoskeletal system  Abnormal posture     Problem List Patient Active Problem List   Diagnosis Date Noted  . Invasive ductal carcinoma of left breast ([place]) 01/27/2014  . Diabetes mellitus due to underlying condition without complications ([place]) 44/96/7591  .  Hypertension, essential, benign 12/24/2013    3:34 PM,01/27/17 Elly Modena PT, DPT [facility] [address] [place], [place], [address] Phone: [phone]   Fax:  [phone]  Name: Christine Sutton MRN: [id number] Date of Birth: [birth year]

## 2017-01-31 ENCOUNTER — Ambulatory Visit (HOSPITAL_COMMUNITY): Payer: Medicare Other | Admitting: Physical Therapy

## 2017-01-31 DIAGNOSIS — M5441 Lumbago with sciatica, right side: Secondary | ICD-10-CM

## 2017-01-31 DIAGNOSIS — G8929 Other chronic pain: Secondary | ICD-10-CM

## 2017-01-31 DIAGNOSIS — R293 Abnormal posture: Secondary | ICD-10-CM

## 2017-01-31 DIAGNOSIS — M6281 Muscle weakness (generalized): Secondary | ICD-10-CM

## 2017-01-31 DIAGNOSIS — R29898 Other symptoms and signs involving the musculoskeletal system: Secondary | ICD-10-CM | POA: Diagnosis not present

## 2017-01-31 NOTE — Therapy (Signed)
[place], [place], [address] Phone: [phone]   Fax:  [phone]  Physical Therapy Treatment  Patient Details  Name: Christine Sutton MRN: [id number] Date of Birth: 03/15/1930 Referring Provider: Asencion Noble, MD  Encounter Date: 01/31/2017      PT End of Session - 01/31/17 1508    Visit Number 6   Number of Visits 13   Date for PT Re-Evaluation 02/02/17   Authorization Type Medicare Part A and B   Authorization Time Period 01/12/17 to 02/23/17   Authorization - Visit Number 6   Authorization - Number of Visits 10   PT Start Time 1120   PT Stop Time 1202   PT Time Calculation (min) 42 min   Activity Tolerance Patient tolerated treatment well;No increased pain   Behavior During Therapy WFL for tasks assessed/performed      Past Medical History:  Diagnosis Date  . Diabetes mellitus ([place])   . High blood cholesterol level   . HX: breast cancer    left  . Hypertension   . Invasive ductal carcinoma of left breast ([place]) 01/27/2014   ER +    Past Surgical History:  Procedure Laterality Date  . BACK SURGERY     lower  . CATARACT EXTRACTION, BILATERAL  11/2014  . MASTECTOMY Left     There were no vitals filed for this visit.      Subjective Assessment - 01/31/17 1123    Subjective Pt states it is worse when she first gets up to about 10/10 but then takes a tylenol arthritis pill and improves.  Most pain remains in Rt hip and glute area.  Currently without pain as she has taken her tylenol.  Has been compliant with HEP   Currently in Pain? No/denies                         [facility] Adult PT Treatment/Exercise - 01/31/17 0001      Knee/Hip Exercises: Stretches   Sports administrator Both;4 reps;30 seconds   Quad Stretch Limitations prone    Piriformis Stretch 2 reps;Both;30 seconds   Piriformis Stretch Limitations seated     Knee/Hip Exercises: Seated   Other Seated Knee/Hip Exercises lateral  flexion with UE reach and hold x10 reps, 5 sec hold each side; seated trunk rotation with red TB 2x10 reps      Knee/Hip Exercises: Supine   Bridges 2 sets;10 reps;Both   Bridges Limitations blue TB around knees, 2nd set with hips elevated on foam     Other Supine Knee/Hip Exercises BUE pressdown with bent knee raise x15 reps each    Other Supine Knee/Hip Exercises low trunk rotation with LE on physioball x15 reps Lt/Rt      Manual Therapy   Manual therapy comments separate rest of session    Soft tissue mobilization Prone, STM along Rt lumbar paraspinals    Passive ROM B hamstring stretch 2x30 sec 90/90                PT Education - 01/31/17 1515    Education provided Yes   Education Details reviewed piriformis stretch and given written instructions   Person(s) Educated Patient   Methods Explanation;Handout;Demonstration;Tactile cues;Verbal cues   Comprehension Verbalized understanding;Returned demonstration;Verbal cues required;Tactile cues required          PT Short Term Goals - 01/12/17 1233      PT SHORT TERM GOAL #1  Title Pt will be independent with HEP and perform consistently in order to maximize return to PLOF.   Time 3   Period Weeks   Target Date 02/02/17     PT SHORT TERM GOAL #2   Title Pt will have improved MMT to 5/5 of all tested muscle groups to decrease pain and improve functional tasks.   Time 3   Period Weeks   Status New     PT SHORT TERM GOAL #3   Title Pt will have improved 5xSTS to 10 sec or < with no UE to demonstrate improved functional strength.   Time 3   Period Weeks   Status New           PT Long Term Goals - 01/12/17 1235      PT LONG TERM GOAL #1   Title Pt will have improved bil SLS to at least 10 sec with no UE support to maximize gait and demonstrate improved overall function.   Time 6   Period Weeks   Status New   Target Date 02/23/17     PT LONG TERM GOAL #2   Title Pt will report an improved sitting tolerance  to at least 1 hour or > to maximize her ability to participate in events at her church.   Time 6   Period Weeks   Status New     PT LONG TERM GOAL #3   Title Pt will report an improved standing tolerance to at least 30 mins or > to maximize her ability to cook meals at home.   Time 6   Period Weeks   Status New     PT LONG TERM GOAL #4   Title Pt will report an improved tolerance to walking to at least 30 mins or > to demonstrate improved overall function and allow her to go shopping with greater ease.   Time 6   Period Weeks   Status New               Plan - 01/31/17 1509    Clinical Impression Statement Continued with LE strengthening and mobility progression.  Pt with cues to push Rt higher/level with Lt when completing bridge due to weakness.   Increase sets/reps where able. Instructed to complete piriformis stretch with HEP as severe tightness Rt.   Finsished session with manual to Rt glute and lumbar musculature.  some tightness, however no spasms palpated.     Rehab Potential Fair   PT Frequency 2x / week   PT Duration 6 weeks   PT Treatment/Interventions ADLs/Self Care Home Management;Cryotherapy;Electrical Stimulation;Moist Heat;Gait training;Stair training;Functional mobility training;Therapeutic activities;Therapeutic exercise;Balance training;Neuromuscular re-education;Cognitive remediation;Patient/family education;Manual techniques;Passive range of motion;Dry needling;Taping   PT Next Visit Plan promote gentle AROM of the trunk. hip extensor strengthening exercise, quad stretch.  continue with core stability exercises.    PT Home Exercise Plan eval: SKTC, HS stretch, wall slide   Consulted and Agree with Plan of Care Patient      Patient will benefit from skilled therapeutic intervention in order to improve the following deficits and impairments:  Decreased balance, Decreased range of motion, Decreased strength, Hypomobility, Increased muscle spasms, Impaired  flexibility, Improper body mechanics, Postural dysfunction, Pain  Visit Diagnosis: Chronic bilateral low back pain with right-sided sciatica  Muscle weakness (generalized)  Other symptoms and signs involving the musculoskeletal system  Abnormal posture     Problem List Patient Active Problem List   Diagnosis Date Noted  . Invasive ductal carcinoma  of left breast ([place]) 01/27/2014  . Diabetes mellitus due to underlying condition without complications ([place]) 17/71/1657  . Hypertension, essential, benign 12/24/2013   Teena Irani, PTA/CLT [phone]   Teena Irani 01/31/2017, 3:15 PM  [facility] [address] [place], [place], [address] Phone: [phone]   Fax:  [phone]  Name: Christine Sutton MRN: [id number] Date of Birth: Feb 16, 1930

## 2017-01-31 NOTE — Patient Instructions (Signed)
Piriformis Stretch, Sitting    Sit, one ankle on opposite knee, same-side hand on crossed knee. Push down on knee, keeping spine straight. Lean torso forward, with flat back, until tension is felt in hamstrings and gluteals of crossed-leg side. Hold _20__ seconds.  Repeat __3_ times per session. Do __2_ sessions per day.  Copyright  VHI. All rights reserved.

## 2017-02-02 ENCOUNTER — Ambulatory Visit (HOSPITAL_COMMUNITY): Payer: Medicare Other

## 2017-02-02 ENCOUNTER — Encounter (HOSPITAL_COMMUNITY): Payer: Self-pay

## 2017-02-02 DIAGNOSIS — M6281 Muscle weakness (generalized): Secondary | ICD-10-CM

## 2017-02-02 DIAGNOSIS — R29898 Other symptoms and signs involving the musculoskeletal system: Secondary | ICD-10-CM

## 2017-02-02 DIAGNOSIS — M5441 Lumbago with sciatica, right side: Secondary | ICD-10-CM

## 2017-02-02 DIAGNOSIS — R293 Abnormal posture: Secondary | ICD-10-CM | POA: Diagnosis not present

## 2017-02-02 DIAGNOSIS — G8929 Other chronic pain: Secondary | ICD-10-CM

## 2017-02-02 NOTE — Therapy (Signed)
[place], [place], [address] Phone: [phone]   Fax:  [phone]  Physical Therapy Treatment  Patient Details  Name: Christine Sutton MRN: [id number] Date of Birth: 12/09/1929 Referring Provider: Asencion Noble, MD  Encounter Date: 02/02/2017      PT End of Session - 02/02/17 1439    Visit Number 7   Number of Visits 13   Date for PT Re-Evaluation 02/23/17   Authorization Type Medicare Part A and B (g codes done on 08/11/22 visit)   Authorization Time Period 01/12/17 to 02/23/17   Authorization - Visit Number 7   Authorization - Number of Visits 10   PT Start Time 4801   PT Stop Time 6553   PT Time Calculation (min) 41 min   Activity Tolerance Patient tolerated treatment well;No increased pain   Behavior During Therapy WFL for tasks assessed/performed      Past Medical History:  Diagnosis Date  . Diabetes mellitus ([place])   . High blood cholesterol level   . HX: breast cancer    left  . Hypertension   . Invasive ductal carcinoma of left breast ([place]) 01/27/2014   ER +    Past Surgical History:  Procedure Laterality Date  . BACK SURGERY     lower  . CATARACT EXTRACTION, BILATERAL  11/2014  . MASTECTOMY Left     There were no vitals filed for this visit.      Subjective Assessment - 02/02/17 1437    Subjective Pt states that she still has pain in her back. She states that first thing in the morning she is very stiff but she thinks it could be her muscle soreness because it is a different than her LBP. She is not in pain at the moment.    Currently in Pain? No/denies              [facility] PT Assessment - 02/02/17 0001      Strength   Right Hip Flexion 5/5  was 4+   Right Hip Extension 4+/5  was 4   Right Hip ABduction 4+/5  was 4+   Left Hip Extension 4+/5  was 4   Left Hip ABduction 4+/5  was 4   Right Knee Flexion 5/5  was 4+   Right Ankle Dorsiflexion 4+/5  was 4+     Balance   Balance  Assessed Yes     Static Standing Balance   Static Standing - Balance Support No upper extremity supported   Static Standing Balance -  Activities  Single Leg Stance - Right Leg;Single Leg Stance - Left Leg   Static Standing - Comment/# of Minutes L: 11 sec or <, R: 7 sec or <     Standardized Balance Assessment   Standardized Balance Assessment Five Times Sit to Stand   Five times sit to stand comments  12.5 sec from chair, no UE support               [facility] Adult PT Treatment/Exercise - 02/02/17 0001      Knee/Hip Exercises: Stretches   Piriformis Stretch Both;3 reps;30 seconds   Piriformis Stretch Limitations supine, pt prefers supine compared to seated     Knee/Hip Exercises: Seated   Hamstring Limitations 3D thoracic excursions x10 reps each     Manual Therapy   Manual Therapy Soft tissue mobilization   Manual therapy comments separate rest of session    Soft tissue mobilization sidelying, STM  to bil distal lumbar paraspinals              PT Education - 02/02/17 1521    Education provided Yes   Education Details made adjustments to piriformis stretch, reassessment findings, will contniue with current POC   Person(s) Educated Patient   Methods Explanation;Demonstration   Comprehension Verbalized understanding;Returned demonstration          PT Short Term Goals - 02/02/17 1440      PT SHORT TERM GOAL #1   Title Pt will be independent with HEP and perform consistently in order to maximize return to PLOF.   Time 3   Period Weeks   Status Achieved     PT SHORT TERM GOAL #2   Title Pt will have improved MMT to 5/5 of all tested muscle groups to decrease pain and improve functional tasks.   Baseline 8/23: pt's MMT has either remained the same or improved, but not all muscle groups are 5/5   Time 3   Period Weeks   Status On-going     PT SHORT TERM GOAL #3   Title Pt will have improved 5xSTS to 10 sec or < with no UE to demonstrate improved functional  strength.   Baseline 8/23: 12.5 sec, was 19   Time 3   Period Weeks   Status On-going           PT Long Term Goals - 02/02/17 1440      PT LONG TERM GOAL #1   Title Pt will have improved bil SLS to at least 10 sec with no UE support to maximize gait and demonstrate improved overall function.   Baseline 8/23: LLE: 11 sec or <, R: 7 sec or <   Time 6   Period Weeks   Status Partially Met     PT LONG TERM GOAL #2   Title Pt will report an improved sitting tolerance to at least 1 hour or > to maximize her ability to participate in events at her church.   Baseline 8/23: 45 mins   Time 6   Period Weeks   Status On-going     PT LONG TERM GOAL #3   Title Pt will report an improved standing tolerance to at least 30 mins or > to maximize her ability to cook meals at home.   Baseline 8/23: 15 mins, but pt reports that she took a few breaks but they weren't as long as usual   Time 6   Period Weeks   Status On-going     PT LONG TERM GOAL #4   Title Pt will report an improved tolerance to walking to at least 30 mins or > to demonstrate improved overall function and allow her to go shopping with greater ease.   Baseline 8/23: 15 mins   Time 6   Period Weeks   Status On-going               Plan - 02/02/17 1523    Clinical Impression Statement PT reassessed pt's goals and outcome measures this date. Pt has met 1 and partially met 1 goal while all other goals are still on-going. Pt feels she has improved 50% since starting therapy reoprting that she feels stronger, and states that the remaining 50% is that she still has difficulty standing for long periods of time. Pt has made good progress towards all goals and her POC will continue as planned. Ended session with manual to lumbar paraspinals as pt reported  increased pain following reassessment testing. Pt reported no pain at EOS. She was once again educated on the difference in muscle soreness and pain and she stated that she  understood.   Rehab Potential Fair   PT Frequency 2x / week   PT Duration 6 weeks   PT Treatment/Interventions ADLs/Self Care Home Management;Cryotherapy;Electrical Stimulation;Moist Heat;Gait training;Stair training;Functional mobility training;Therapeutic activities;Therapeutic exercise;Balance training;Neuromuscular re-education;Cognitive remediation;Patient/family education;Manual techniques;Passive range of motion;Dry needling;Taping   PT Next Visit Plan promote gentle AROM of the trunk. hip extensor strengthening exercise, quad stretch.  continue with core stability exercises; pt wants new print out of HEP   PT Home Exercise Plan eval: SKTC, HS stretch, wall slide;   Consulted and Agree with Plan of Care Patient      Patient will benefit from skilled therapeutic intervention in order to improve the following deficits and impairments:  Decreased balance, Decreased range of motion, Decreased strength, Hypomobility, Increased muscle spasms, Impaired flexibility, Improper body mechanics, Postural dysfunction, Pain  Visit Diagnosis: Chronic bilateral low back pain with right-sided sciatica  Muscle weakness (generalized)  Other symptoms and signs involving the musculoskeletal system  Abnormal posture       G-Codes - February 24, 2017 1530    Functional Assessment Tool Used (Outpatient Only) FOTO, clinical judgement, 5xSTS, MMT, SLS   Functional Limitation Mobility: Walking and moving around   Mobility: Walking and Moving Around Current Status [phone]) At least 40 percent but less than 60 percent impaired, limited or restricted   Mobility: Walking and Moving Around Goal Status [phone]) At least 1 percent but less than 20 percent impaired, limited or restricted      Problem List Patient Active Problem List   Diagnosis Date Noted  . Invasive ductal carcinoma of left breast ([place]) 01/27/2014  . Diabetes mellitus due to underlying condition without complications ([place]) 79/89/2119  . Hypertension,  essential, benign 12/24/2013      Geraldine Solar PT, DPT  [facility] [address] [place], [place], [address] Phone: [phone]   Fax:  [phone]  Name: Christine Sutton MRN: [id number] Date of Birth: 12-Dec-1929

## 2017-02-06 ENCOUNTER — Telehealth (HOSPITAL_COMMUNITY): Payer: Self-pay | Admitting: Internal Medicine

## 2017-02-06 ENCOUNTER — Ambulatory Visit (HOSPITAL_COMMUNITY): Payer: Medicare Other | Admitting: Physical Therapy
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
## 2017-02-08 ENCOUNTER — Encounter (HOSPITAL_COMMUNITY): Payer: Self-pay

## 2017-02-08 ENCOUNTER — Ambulatory Visit (HOSPITAL_COMMUNITY): Payer: Medicare Other

## 2017-02-08 DIAGNOSIS — G8929 Other chronic pain: Secondary | ICD-10-CM

## 2017-02-08 DIAGNOSIS — R293 Abnormal posture: Secondary | ICD-10-CM | POA: Diagnosis not present

## 2017-02-08 DIAGNOSIS — M6281 Muscle weakness (generalized): Secondary | ICD-10-CM

## 2017-02-08 DIAGNOSIS — R29898 Other symptoms and signs involving the musculoskeletal system: Secondary | ICD-10-CM | POA: Diagnosis not present

## 2017-02-08 DIAGNOSIS — M5441 Lumbago with sciatica, right side: Secondary | ICD-10-CM

## 2017-02-08 NOTE — Therapy (Signed)
[place], [place], [address] Phone: [phone]   Fax:  [phone]  Physical Therapy Treatment  Patient Details  Name: Christine Sutton MRN: [id number] Date of Birth: 1929/06/28 Referring Provider: Asencion Noble, MD  Encounter Date: 02/08/2017      PT End of Session - 02/08/17 1434    Visit Number 8   Number of Visits 13   Date for PT Re-Evaluation 02/23/17   Authorization Type Medicare Part A and B (g codes done on July 05, 2022 visit)   Authorization Time Period 01/12/17 to 02/23/17   Authorization - Visit Number 8   Authorization - Number of Visits 10   PT Start Time 6384   PT Stop Time 1515   PT Time Calculation (min) 40 min   Activity Tolerance Patient tolerated treatment well;No increased pain   Behavior During Therapy WFL for tasks assessed/performed      Past Medical History:  Diagnosis Date  . Diabetes mellitus ([place])   . High blood cholesterol level   . HX: breast cancer    left  . Hypertension   . Invasive ductal carcinoma of left breast ([place]) 01/27/2014   ER +    Past Surgical History:  Procedure Laterality Date  . BACK SURGERY     lower  . CATARACT EXTRACTION, BILATERAL  11/2014  . MASTECTOMY Left     There were no vitals filed for this visit.      Subjective Assessment - 02/08/17 1436    Subjective Pt states that she does not have any pain but her legs and her back are sore.   Currently in Pain? No/denies            [facility] Adult PT Treatment/Exercise - 02/08/17 0001      Knee/Hip Exercises: Stretches   Sports administrator Both;3 reps;30 seconds   Quad Stretch Limitations supine thomas test stretch   Piriformis Stretch Both;2 reps;30 seconds   Piriformis Stretch Limitations supine     Knee/Hip Exercises: Standing   SLS 5x10" each   SLS with Vectors 5" holds, x5RT each   Other Standing Knee Exercises Y's on wall with liftoff x15 reps   Other Standing Knee Exercises fwd/retro tandem gait 23f  x 2RT     Knee/Hip Exercises: Seated   Other Seated Knee/Hip Exercises trunk rotations on dynadisc with GTB 2x10 each   Hamstring Limitations 3D thoracic excursions x10 reps each   Sit to Sand 10 reps;without UE support  GTB around knees     Knee/Hip Exercises: Supine   Bridges 2 sets;10 reps   Bridges Limitations BTB around knees, feet elevatedon 4" step             PT Education - 02/08/17 1523    Education provided Yes   Education Details exercise technique   Person(s) Educated Patient   Methods Explanation;Demonstration;Tactile cues;Verbal cues   Comprehension Verbalized understanding;Returned demonstration;Need further instruction          PT Short Term Goals - 02/02/17 1440      PT SHORT TERM GOAL #1   Title Pt will be independent with HEP and perform consistently in order to maximize return to PLOF.   Time 3   Period Weeks   Status Achieved     PT SHORT TERM GOAL #2   Title Pt will have improved MMT to 5/5 of all tested muscle groups to decrease pain and improve functional tasks.   Baseline 8/23: pt's MMT has either  remained the same or improved, but not all muscle groups are 5/5   Time 3   Period Weeks   Status On-going     PT SHORT TERM GOAL #3   Title Pt will have improved 5xSTS to 10 sec or < with no UE to demonstrate improved functional strength.   Baseline 8/23: 12.5 sec, was 19   Time 3   Period Weeks   Status On-going           PT Long Term Goals - 02/02/17 1440      PT LONG TERM GOAL #1   Title Pt will have improved bil SLS to at least 10 sec with no UE support to maximize gait and demonstrate improved overall function.   Baseline 8/23: LLE: 11 sec or <, R: 7 sec or <   Time 6   Period Weeks   Status Partially Met     PT LONG TERM GOAL #2   Title Pt will report an improved sitting tolerance to at least 1 hour or > to maximize her ability to participate in events at her church.   Baseline 8/23: 45 mins   Time 6   Period Weeks    Status On-going     PT LONG TERM GOAL #3   Title Pt will report an improved standing tolerance to at least 30 mins or > to maximize her ability to cook meals at home.   Baseline 8/23: 15 mins, but pt reports that she took a few breaks but they weren't as long as usual   Time 6   Period Weeks   Status On-going     PT LONG TERM GOAL #4   Title Pt will report an improved tolerance to walking to at least 30 mins or > to demonstrate improved overall function and allow her to go shopping with greater ease.   Baseline 8/23: 15 mins   Time 6   Period Weeks   Status On-going               Plan - 02/08/17 1523    Clinical Impression Statement Pt presented to therapy with only c/o soreness and no pain. Session focused on improving mobility, strength, and balance. Pt tolerated well, not reporting any pain throughout the session. She was challenged with balance activities but can be progressed in tandem gait next session. Continue POC as planned.   Rehab Potential Fair   PT Frequency 2x / week   PT Duration 6 weeks   PT Treatment/Interventions ADLs/Self Care Home Management;Cryotherapy;Electrical Stimulation;Moist Heat;Gait training;Stair training;Functional mobility training;Therapeutic activities;Therapeutic exercise;Balance training;Neuromuscular re-education;Cognitive remediation;Patient/family education;Manual techniques;Passive range of motion;Dry needling;Taping   PT Next Visit Plan continue to improve trunk ROM, hip extensor strengthening exercise, quad stretch.  continue with core stability exercises; progress balance   PT Home Exercise Plan eval: SKTC, HS stretch, wall slide;   Consulted and Agree with Plan of Care Patient      Patient will benefit from skilled therapeutic intervention in order to improve the following deficits and impairments:  Decreased balance, Decreased range of motion, Decreased strength, Hypomobility, Increased muscle spasms, Impaired flexibility, Improper  body mechanics, Postural dysfunction, Pain  Visit Diagnosis: Chronic bilateral low back pain with right-sided sciatica  Muscle weakness (generalized)  Other symptoms and signs involving the musculoskeletal system  Abnormal posture     Problem List Patient Active Problem List   Diagnosis Date Noted  . Invasive ductal carcinoma of left breast ([place]) 01/27/2014  . Diabetes mellitus due  to underlying condition without complications ([place]) 19/37/9024  . Hypertension, essential, benign 12/24/2013      Geraldine Solar PT, DPT  [facility] [address] [place], [place], [address] Phone: [phone]   Fax:  [phone]  Name: Christine Sutton MRN: [id number] Date of Birth: 1929/11/18

## 2017-02-15 ENCOUNTER — Encounter (HOSPITAL_COMMUNITY): Payer: Self-pay

## 2017-02-15 ENCOUNTER — Ambulatory Visit (HOSPITAL_COMMUNITY): Payer: Medicare Other | Attending: Internal Medicine

## 2017-02-15 DIAGNOSIS — G8929 Other chronic pain: Secondary | ICD-10-CM | POA: Insufficient documentation

## 2017-02-15 DIAGNOSIS — R293 Abnormal posture: Secondary | ICD-10-CM | POA: Insufficient documentation

## 2017-02-15 DIAGNOSIS — M6281 Muscle weakness (generalized): Secondary | ICD-10-CM | POA: Diagnosis not present

## 2017-02-15 DIAGNOSIS — R29898 Other symptoms and signs involving the musculoskeletal system: Secondary | ICD-10-CM | POA: Diagnosis not present

## 2017-02-15 DIAGNOSIS — M5441 Lumbago with sciatica, right side: Secondary | ICD-10-CM | POA: Insufficient documentation

## 2017-02-15 NOTE — Therapy (Signed)
[place], [place], [address] Phone: [phone]   Fax:  [phone]  Physical Therapy Treatment  Patient Details  Name: Christine Sutton MRN: [id number] Date of Birth: 1929/09/24 Referring Provider: Asencion Noble, MD  Encounter Date: 02/15/2017      PT End of Session - 02/15/17 1033    Visit Number 9   Number of Visits 13   Date for PT Re-Evaluation 02/23/17   Authorization Type Medicare Part A and B (g codes done on Jul 23, 2022 visit)   Authorization Time Period 01/12/17 to 02/23/17   Authorization - Visit Number 9   Authorization - Number of Visits 13   PT Start Time 3785   PT Stop Time 1109   PT Time Calculation (min) 39 min   Activity Tolerance Patient tolerated treatment well;No increased pain   Behavior During Therapy WFL for tasks assessed/performed      Past Medical History:  Diagnosis Date  . Diabetes mellitus ([place])   . High blood cholesterol level   . HX: breast cancer    left  . Hypertension   . Invasive ductal carcinoma of left breast ([place]) 01/27/2014   ER +    Past Surgical History:  Procedure Laterality Date  . BACK SURGERY     lower  . CATARACT EXTRACTION, BILATERAL  11/2014  . MASTECTOMY Left     There were no vitals filed for this visit.      Subjective Assessment - 02/15/17 1033    Subjective Pt states that her back is feeling better. She took a Tylenol this morning.   Currently in Pain? No/denies                [facility] Adult PT Treatment/Exercise - 02/15/17 0001      Knee/Hip Exercises: Stretches   Sports administrator Both;3 reps;30 seconds   Quad Stretch Limitations prone with rope   Other Knee/Hip Stretches SKTC 2x30 each   Other Knee/Hip Stretches child's pose 2x30 sec     Knee/Hip Exercises: Standing   SLS foam, 5x10" each   Other Standing Knee Exercises Y's on wall with liftoff x15 reps   Other Standing Knee Exercises fwd tandem gait on foam x2RT, retro tandem gait on foam x1RT      Knee/Hip Exercises: Seated   Other Seated Knee/Hip Exercises trunk rotations on dynadisc with GTB 2x10 each   Hamstring Limitations 3D thoracic excursions x10 reps each; thoracic extension over 1/2 foam roll x 5 reps each T8 to T3     Knee/Hip Exercises: Supine   Other Supine Knee/Hip Exercises bent knee raise and pulldowns with purple band 2x15 reps              PT Education - 02/15/17 1110    Education provided Yes   Education Details continue HEP, exercise technique   Person(s) Educated Patient   Methods Explanation;Demonstration   Comprehension Verbalized understanding;Returned demonstration          PT Short Term Goals - 02/02/17 1440      PT SHORT TERM GOAL #1   Title Pt will be independent with HEP and perform consistently in order to maximize return to PLOF.   Time 3   Period Weeks   Status Achieved     PT SHORT TERM GOAL #2   Title Pt will have improved MMT to 5/5 of all tested muscle groups to decrease pain and improve functional tasks.   Baseline 8/23: pt's MMT has either  remained the same or improved, but not all muscle groups are 5/5   Time 3   Period Weeks   Status On-going     PT SHORT TERM GOAL #3   Title Pt will have improved 5xSTS to 10 sec or < with no UE to demonstrate improved functional strength.   Baseline 8/23: 12.5 sec, was 19   Time 3   Period Weeks   Status On-going           PT Long Term Goals - 02/02/17 1440      PT LONG TERM GOAL #1   Title Pt will have improved bil SLS to at least 10 sec with no UE support to maximize gait and demonstrate improved overall function.   Baseline 8/23: LLE: 11 sec or <, R: 7 sec or <   Time 6   Period Weeks   Status Partially Met     PT LONG TERM GOAL #2   Title Pt will report an improved sitting tolerance to at least 1 hour or > to maximize her ability to participate in events at her church.   Baseline 8/23: 45 mins   Time 6   Period Weeks   Status On-going     PT LONG TERM GOAL #3    Title Pt will report an improved standing tolerance to at least 30 mins or > to maximize her ability to cook meals at home.   Baseline 8/23: 15 mins, but pt reports that she took a few breaks but they weren't as long as usual   Time 6   Period Weeks   Status On-going     PT LONG TERM GOAL #4   Title Pt will report an improved tolerance to walking to at least 30 mins or > to demonstrate improved overall function and allow her to go shopping with greater ease.   Baseline 8/23: 15 mins   Time 6   Period Weeks   Status On-going               Plan - 02/15/17 1110    Clinical Impression Statement Focus of today's session was on mobility, posture, and balance. Pt did well throughout entire session, not reporting any pain. She was challenged by balance activities this date and encouraged to continue her current HEP. Continue POC as planned.   Rehab Potential Fair   PT Frequency 2x / week   PT Duration 6 weeks   PT Treatment/Interventions ADLs/Self Care Home Management;Cryotherapy;Electrical Stimulation;Moist Heat;Gait training;Stair training;Functional mobility training;Therapeutic activities;Therapeutic exercise;Balance training;Neuromuscular re-education;Cognitive remediation;Patient/family education;Manual techniques;Passive range of motion;Dry needling;Taping   PT Next Visit Plan continue to improve trunk ROM, hip extensor strengthening exercise, quad stretch.  continue with core stability exercises; progress balance; add step ups for hip ext strengthening   PT Home Exercise Plan eval: SKTC, HS stretch, wall slide;   Consulted and Agree with Plan of Care Patient      Patient will benefit from skilled therapeutic intervention in order to improve the following deficits and impairments:  Decreased balance, Decreased range of motion, Decreased strength, Hypomobility, Increased muscle spasms, Impaired flexibility, Improper body mechanics, Postural dysfunction, Pain  Visit  Diagnosis: Chronic bilateral low back pain with right-sided sciatica  Muscle weakness (generalized)  Other symptoms and signs involving the musculoskeletal system  Abnormal posture     Problem List Patient Active Problem List   Diagnosis Date Noted  . Invasive ductal carcinoma of left breast ([place]) 01/27/2014  . Diabetes mellitus due to underlying  condition without complications ([place]) 67/89/3810  . Hypertension, essential, benign 12/24/2013      Geraldine Solar PT, DPT  [facility] [address] [place], [place], [address] Phone: [phone]   Fax:  [phone]  Name: Christine Sutton MRN: [id number] Date of Birth: [birth year]

## 2017-02-16 ENCOUNTER — Ambulatory Visit (HOSPITAL_COMMUNITY): Payer: Medicare Other | Admitting: Physical Therapy

## 2017-02-16 DIAGNOSIS — G8929 Other chronic pain: Secondary | ICD-10-CM | POA: Diagnosis not present

## 2017-02-16 DIAGNOSIS — M5441 Lumbago with sciatica, right side: Secondary | ICD-10-CM | POA: Diagnosis not present

## 2017-02-16 DIAGNOSIS — R293 Abnormal posture: Secondary | ICD-10-CM | POA: Diagnosis not present

## 2017-02-16 DIAGNOSIS — M6281 Muscle weakness (generalized): Secondary | ICD-10-CM | POA: Diagnosis not present

## 2017-02-16 DIAGNOSIS — R29898 Other symptoms and signs involving the musculoskeletal system: Secondary | ICD-10-CM

## 2017-02-16 NOTE — Therapy (Signed)
[facility] [place], [place], [address] Phone: [phone]   Fax:  [phone]  Physical Therapy Treatment  Patient Details  Name: Christine Sutton MRN: [id number] Date of Birth: 1930/04/13 Referring Provider: Asencion Noble, MD  Encounter Date: 02/16/2017      PT End of Session - 02/16/17 1557    Visit Number 10   Number of Visits 13   Date for PT Re-Evaluation 02/23/17   Authorization Type Medicare Part A and B (g codes done on August 18, 2022 visit)   Authorization Time Period 01/12/17 to 02/23/17   Authorization - Visit Number 10   Authorization - Number of Visits 17   PT Start Time 3536   PT Stop Time 1443   PT Time Calculation (min) 40 min   Activity Tolerance Patient tolerated treatment well;No increased pain   Behavior During Therapy WFL for tasks assessed/performed      Past Medical History:  Diagnosis Date  . Diabetes mellitus ([place])   . High blood cholesterol level   . HX: breast cancer    left  . Hypertension   . Invasive ductal carcinoma of left breast ([place]) 01/27/2014   ER +    Past Surgical History:  Procedure Laterality Date  . BACK SURGERY     lower  . CATARACT EXTRACTION, BILATERAL  11/2014  . MASTECTOMY Left     There were no vitals filed for this visit.      Subjective Assessment - 02/16/17 1438    Subjective Pt states she had to take meds this morning for back pain andinto Rt hip.   Currently without pain.     Currently in Pain? No/denies                         [facility] Adult PT Treatment/Exercise - 02/16/17 0001      Knee/Hip Exercises: Standing   Step Down Both;10 reps;Step Height: 4"   Step Down Limitations eccentric control with 1 HHA   SLS with Vectors 5" holds, x5RT X 2 sets each   Gait Training standing palov press bil LE's red theraband 10 reps each eay   Other Standing Knee Exercises Y's on wall with liftoff x15 reps   Other Standing Knee Exercises fwd tandem gait on foam  x2RT, retro tandem gait on foam x2RT     Knee/Hip Exercises: Seated   Sit to Sand 10 reps;without UE support  from 18 inch step     Knee/Hip Exercises: Supine   Single Leg Bridge Both;10 reps                PT Education - 02/15/17 1110    Education provided Yes   Education Details continue HEP, exercise technique   Person(s) Educated Patient   Methods Explanation;Demonstration   Comprehension Verbalized understanding;Returned demonstration          PT Short Term Goals - 02/02/17 1440      PT SHORT TERM GOAL #1   Title Pt will be independent with HEP and perform consistently in order to maximize return to PLOF.   Time 3   Period Weeks   Status Achieved     PT SHORT TERM GOAL #2   Title Pt will have improved MMT to 5/5 of all tested muscle groups to decrease pain and improve functional tasks.   Baseline 8/23: pt's MMT has either remained the same or improved, but not all muscle groups are 5/5  Time 3   Period Weeks   Status On-going     PT SHORT TERM GOAL #3   Title Pt will have improved 5xSTS to 10 sec or < with no UE to demonstrate improved functional strength.   Baseline 8/23: 12.5 sec, was 19   Time 3   Period Weeks   Status On-going           PT Long Term Goals - 02/02/17 1440      PT LONG TERM GOAL #1   Title Pt will have improved bil SLS to at least 10 sec with no UE support to maximize gait and demonstrate improved overall function.   Baseline 8/23: LLE: 11 sec or <, R: 7 sec or <   Time 6   Period Weeks   Status Partially Met     PT LONG TERM GOAL #2   Title Pt will report an improved sitting tolerance to at least 1 hour or > to maximize her ability to participate in events at her church.   Baseline 8/23: 45 mins   Time 6   Period Weeks   Status On-going     PT LONG TERM GOAL #3   Title Pt will report an improved standing tolerance to at least 30 mins or > to maximize her ability to cook meals at home.   Baseline 8/23: 15 mins, but pt  reports that she took a few breaks but they weren't as long as usual   Time 6   Period Weeks   Status On-going     PT LONG TERM GOAL #4   Title Pt will report an improved tolerance to walking to at least 30 mins or > to demonstrate improved overall function and allow her to go shopping with greater ease.   Baseline 8/23: 15 mins   Time 6   Period Weeks   Status On-going               Plan - 02/16/17 1600    Clinical Impression Statement continued with focus on balance and addition of eccentric quad and hip/core strengthening.  Pt with most difficulty completing vector stances and palov press due to weak core and glutes.  Decreased sit to stand height to 18" box this session with increased difficulty . Pt without return of pain at EOS.   Rehab Potential Fair   PT Frequency 2x / week   PT Duration 6 weeks   PT Treatment/Interventions ADLs/Self Care Home Management;Cryotherapy;Electrical Stimulation;Moist Heat;Gait training;Stair training;Functional mobility training;Therapeutic activities;Therapeutic exercise;Balance training;Neuromuscular re-education;Cognitive remediation;Patient/family education;Manual techniques;Passive range of motion;Dry needling;Taping   PT Next Visit Plan continue to improve trunk ROM, hip extensor strengthening exercise, quad stretch.  continue with core stability exercises; progress balance.   PT Home Exercise Plan eval: SKTC, HS stretch, wall slide;   Consulted and Agree with Plan of Care Patient      Patient will benefit from skilled therapeutic intervention in order to improve the following deficits and impairments:  Decreased balance, Decreased range of motion, Decreased strength, Hypomobility, Increased muscle spasms, Impaired flexibility, Improper body mechanics, Postural dysfunction, Pain  Visit Diagnosis: Chronic bilateral low back pain with right-sided sciatica  Muscle weakness (generalized)  Other symptoms and signs involving the  musculoskeletal system  Abnormal posture     Problem List Patient Active Problem List   Diagnosis Date Noted  . Invasive ductal carcinoma of left breast ([place]) 01/27/2014  . Diabetes mellitus due to underlying condition without complications ([place]) 37/62/8315  . Hypertension,  essential, benign 12/24/2013   Teena Irani, PTA/CLT [phone]  Teena Irani 02/16/2017, 4:02 PM  [facility] [address] [place], [place], [address] Phone: [phone]   Fax:  [phone]  Name: Christine Sutton MRN: [id number] Date of Birth: [birth year]

## 2017-02-20 ENCOUNTER — Encounter (HOSPITAL_COMMUNITY): Payer: Self-pay

## 2017-02-20 ENCOUNTER — Ambulatory Visit (HOSPITAL_COMMUNITY): Payer: Medicare Other

## 2017-02-20 DIAGNOSIS — M6281 Muscle weakness (generalized): Secondary | ICD-10-CM | POA: Diagnosis not present

## 2017-02-20 DIAGNOSIS — R293 Abnormal posture: Secondary | ICD-10-CM

## 2017-02-20 DIAGNOSIS — G8929 Other chronic pain: Secondary | ICD-10-CM

## 2017-02-20 DIAGNOSIS — R29898 Other symptoms and signs involving the musculoskeletal system: Secondary | ICD-10-CM

## 2017-02-20 DIAGNOSIS — M5441 Lumbago with sciatica, right side: Secondary | ICD-10-CM | POA: Diagnosis not present

## 2017-02-20 NOTE — Therapy (Signed)
[place], [place], [address] Phone: [phone]   Fax:  [phone]  Physical Therapy Treatment  Patient Details  Name: Christine Sutton MRN: [id number] Date of Birth: January 11, 1930 Referring Provider: Asencion Noble, MD  Encounter Date: 02/20/2017      PT End of Session - 02/20/17 1300    Visit Number 11   Number of Visits 13   Date for PT Re-Evaluation 02/23/17   Authorization Type Medicare Part A and B (g codes done on 2022/07/25 visit)   Authorization Time Period 01/12/17 to 02/23/17   Authorization - Visit Number 11   Authorization - Number of Visits 17   PT Start Time 1300   PT Stop Time 1341   PT Time Calculation (min) 41 min   Activity Tolerance Patient tolerated treatment well;No increased pain   Behavior During Therapy WFL for tasks assessed/performed      Past Medical History:  Diagnosis Date  . Diabetes mellitus ([place])   . High blood cholesterol level   . HX: breast cancer    left  . Hypertension   . Invasive ductal carcinoma of left breast ([place]) 01/27/2014   ER +    Past Surgical History:  Procedure Laterality Date  . BACK SURGERY     lower  . CATARACT EXTRACTION, BILATERAL  11/2014  . MASTECTOMY Left     There were no vitals filed for this visit.      Subjective Assessment - 02/20/17 1300    Subjective Pt states that her back pain is about the same but she's making out alright. Her pain isn't too bad today.    Currently in Pain? No/denies            [facility] Adult PT Treatment/Exercise - 02/20/17 0001      Knee/Hip Exercises: Stretches   Sports administrator Both;3 reps;30 seconds   Quad Stretch Limitations prone with rope   Other Knee/Hip Stretches child's pose 1x30 sec, 2x30 sec with UE to the L     Knee/Hip Exercises: Standing   Hip Abduction Both;10 reps   Abduction Limitations RTB   Hip Extension Both;10 reps   Extension Limitations RTB   Lateral Step Up Both;10 reps;Hand Hold: 0;Step  Height: 4"   Forward Step Up Both;10 reps;Hand Hold: 0;Step Height: 4"   Functional Squat 2 sets;10 reps   Functional Squat Limitations 2nd set was goblet squat with 5# DB    Wall Squat 15 reps     Knee/Hip Exercises: Prone   Hip Extension Both;2 sets;10 reps     Manual Therapy   Manual Therapy Soft tissue mobilization   Manual therapy comments separate rest of session    Soft tissue mobilization sidelying to R glute med/max             PT Education - 02/20/17 1343    Education provided Yes   Education Details updated HEP, will reassess next visit   Person(s) Educated Patient   Methods Explanation;Demonstration;Handout   Comprehension Verbalized understanding;Returned demonstration          PT Short Term Goals - 02/02/17 1440      PT SHORT TERM GOAL #1   Title Pt will be independent with HEP and perform consistently in order to maximize return to PLOF.   Time 3   Period Weeks   Status Achieved     PT SHORT TERM GOAL #2   Title Pt will have improved MMT to 5/5 of all  tested muscle groups to decrease pain and improve functional tasks.   Baseline 8/23: pt's MMT has either remained the same or improved, but not all muscle groups are 5/5   Time 3   Period Weeks   Status On-going     PT SHORT TERM GOAL #3   Title Pt will have improved 5xSTS to 10 sec or < with no UE to demonstrate improved functional strength.   Baseline 8/23: 12.5 sec, was 19   Time 3   Period Weeks   Status On-going           PT Long Term Goals - 02/02/17 1440      PT LONG TERM GOAL #1   Title Pt will have improved bil SLS to at least 10 sec with no UE support to maximize gait and demonstrate improved overall function.   Baseline 8/23: LLE: 11 sec or <, R: 7 sec or <   Time 6   Period Weeks   Status Partially Met     PT LONG TERM GOAL #2   Title Pt will report an improved sitting tolerance to at least 1 hour or > to maximize her ability to participate in events at her church.    Baseline 8/23: 45 mins   Time 6   Period Weeks   Status On-going     PT LONG TERM GOAL #3   Title Pt will report an improved standing tolerance to at least 30 mins or > to maximize her ability to cook meals at home.   Baseline 8/23: 15 mins, but pt reports that she took a few breaks but they weren't as long as usual   Time 6   Period Weeks   Status On-going     PT LONG TERM GOAL #4   Title Pt will report an improved tolerance to walking to at least 30 mins or > to demonstrate improved overall function and allow her to go shopping with greater ease.   Baseline 8/23: 15 mins   Time 6   Period Weeks   Status On-going               Plan - 02/20/17 1343    Clinical Impression Statement Session focused on gluteal strengthening and improving soft tissue restrictions this date. Pt reported a spasm in her R glute following prone hip extension. Pt had palpable knot in muscle which was very tender to palpation. Pt reported decreased pain and no reports of the muscle spasm following manual therapy. Updated pt's HEP to include glute strengthening. Pt due for reassessment next visit and future POC will be determined at that time.   Rehab Potential Fair   PT Frequency 2x / week   PT Duration 6 weeks   PT Treatment/Interventions ADLs/Self Care Home Management;Cryotherapy;Electrical Stimulation;Moist Heat;Gait training;Stair training;Functional mobility training;Therapeutic activities;Therapeutic exercise;Balance training;Neuromuscular re-education;Cognitive remediation;Patient/family education;Manual techniques;Passive range of motion;Dry needling;Taping   PT Next Visit Plan reassess   PT Home Exercise Plan eval: SKTC, HS stretch, wall slide; 9/10: lateral child's pose, sit <> stands, mini squats   Consulted and Agree with Plan of Care Patient      Patient will benefit from skilled therapeutic intervention in order to improve the following deficits and impairments:  Decreased balance,  Decreased range of motion, Decreased strength, Hypomobility, Increased muscle spasms, Impaired flexibility, Improper body mechanics, Postural dysfunction, Pain  Visit Diagnosis: Chronic bilateral low back pain with right-sided sciatica  Muscle weakness (generalized)  Other symptoms and signs involving the musculoskeletal system  Abnormal posture     Problem List Patient Active Problem List   Diagnosis Date Noted  . Invasive ductal carcinoma of left breast ([place]) 01/27/2014  . Diabetes mellitus due to underlying condition without complications ([place]) 36/14/4315  . Hypertension, essential, benign 12/24/2013      Geraldine Solar PT, DPT  [facility] [address] [place], [place], [address] Phone: [phone]   Fax:  [phone]  Name: Christine Sutton MRN: [id number] Date of Birth: January 15, 1930

## 2017-02-20 NOTE — Patient Instructions (Signed)
  CHILD POSE - PRAYER STRETCH - LATERAL  While on your hand and knees in a crawl position, slowly lower your buttocks towards your feet. Also, lower your chest towards the floor and walk your hands to the left and hold that stretch.  Perform 1-2x/day, 3-5 stretches holding for 30 seconds each.  SIT TO STAND - NO SUPPORT  Start by scooting close to the front of the chair.  Next, lean forward at your trunk and reach forward with your arms and rise to standing without using your hands to push off from the chair or other object.   Use your arms as a counter-balance by reaching forward when in sitting and lower them as you approach standing.   Perform 1x/day, 2-3 sets of 10 reps   Mini Squat at Counter  Standing with feet shoulder length apart, bend knees slightly and return to standing position. Don't let knees go over toes and keep hands lightly on counter top for support.  Perform 1x/day, 2-3 sets of 10 reps

## 2017-02-23 ENCOUNTER — Ambulatory Visit (HOSPITAL_COMMUNITY): Payer: Medicare Other | Admitting: Physical Therapy

## 2017-02-23 DIAGNOSIS — R29898 Other symptoms and signs involving the musculoskeletal system: Secondary | ICD-10-CM

## 2017-02-23 DIAGNOSIS — M6281 Muscle weakness (generalized): Secondary | ICD-10-CM | POA: Diagnosis not present

## 2017-02-23 DIAGNOSIS — M5441 Lumbago with sciatica, right side: Secondary | ICD-10-CM | POA: Diagnosis not present

## 2017-02-23 DIAGNOSIS — G8929 Other chronic pain: Secondary | ICD-10-CM

## 2017-02-23 DIAGNOSIS — R293 Abnormal posture: Secondary | ICD-10-CM | POA: Diagnosis not present

## 2017-02-23 NOTE — Patient Instructions (Signed)
Abduction: Side Leg Lift (Eccentric) - Side-Lying    Lie on side. Lift top leg slightly higher than shoulder level. Keep top leg straight with body, toes pointing forward. Slowly lower _10_ reps per set, _2__ sets per day.     HIP / KNEE: Extension - Prone    Squeeze glutes. Raise leg up. Keep knee straight. _10_ reps per set, _2__ sets per day

## 2017-02-23 NOTE — Therapy (Signed)
[place], [place], [address] Phone: [phone]   Fax:  [phone]  Physical Therapy Treatment  I spoke with Roseanne Reno, PTA, regarding this patient's status prior to treatment. I have personally read and reviewed this note and agree with its findings and approve of the discharge POC.   Geraldine Solar PT, DPT    Patient Details  Name: Christine Sutton MRN: [id number] Date of Birth: 11/13/1929 Referring Provider: Asencion Noble, MD  Encounter Date: 02/23/2017      PT End of Session - 02/23/17 1406    Visit Number 12   Number of Visits 13   Date for PT Re-Evaluation 02/23/17   Authorization Type Medicare Part A and B (g codes done on 07/01/22 visit)   Authorization Time Period 01/12/17 to 02/23/17   Authorization - Visit Number 12   Authorization - Number of Visits 17   PT Start Time 4356   PT Stop Time 1336   PT Time Calculation (min) 34 min   Activity Tolerance Patient tolerated treatment well;No increased pain   Behavior During Therapy WFL for tasks assessed/performed      Past Medical History:  Diagnosis Date  . Diabetes mellitus ([place])   . High blood cholesterol level   . HX: breast cancer    left  . Hypertension   . 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 BACK SURGERY     lower  . CATARACT EXTRACTION, BILATERAL  11/2014  . MASTECTOMY Left     There were no vitals filed for this visit.      Subjective Assessment - 02/23/17 1401    Subjective Pt states she is not hurting much today.  Reports the stretch she was shown last session really helps (childs pose).  States she feels she is ready for discharge and 85% better.            [facility] PT Assessment - 02/23/17 0001      Assessment   Medical Diagnosis LBP   Referring Provider Asencion Noble, MD     Observation/Other Assessments   Observations Scoliosis (R side higher  than L when bending forward)   Focus on Therapeutic Outcomes (FOTO)  48% limited (was 40% limited)     Strength   Right Hip Flexion 5/5  was 4+/5   Right Hip Extension 4+/5  was 4/5   Right Hip ABduction 5/5  was 4+/5   Left Hip Flexion 5/5   Left Hip Extension 5/5  was 4/5   Left Hip ABduction 5/5  was 4/5   Right Knee Flexion 5/5  was 4+/5   Right Knee Extension 5/5   Left Knee Flexion 5/5   Left Knee Extension 5/5   Right Ankle Dorsiflexion 4+/5  was 4+/5   Left Ankle Dorsiflexion 5/5     Ambulation/Gait   Ambulation Distance (Feet) 791 Feet  3 minute walk test (was 678 feet)   Assistive device None   Gait Pattern Step-through pattern;Within Functional Limits     Balance   Balance Assessed Yes     Static Standing Balance   Static Standing - Balance Support No upper extremity supported   Static Standing Balance -  Activities  Single Leg Stance - Right Leg;Single Leg Stance - Left Leg   Static Standing - Comment/# of Minutes bilaterally 10 sec (was 4" or< bilaterally)  Standardized Balance Assessment   Standardized Balance Assessment Five Times Sit to Stand   Five times sit to stand comments  8.5 sec from chair, no UE support (was 19 seconds)                PT Education - 02/23/17 1404    Education provided Yes   Education Details instructed to continue HEP, updated HEP to include focus on weak muscles. Reviewed progress and goals met    Person(s) Educated Patient   Methods Explanation;Demonstration;Handout   Comprehension Verbalized understanding;Returned demonstration          PT Short Term Goals - 02/23/17 1406      PT SHORT TERM GOAL #1   Title Pt will be independent with HEP and perform consistently in order to maximize return to PLOF.   Time 3   Period Weeks   Status Achieved     PT SHORT TERM GOAL #2   Title Pt will have improved MMT to 5/5 of all tested muscle groups to decrease pain and improve functional tasks.   Baseline 8/23:  pt's MMT has either remained the same or improved, but not all muscle groups are 5/5   Time 3   Period Weeks   Status On-going     PT SHORT TERM GOAL #3   Title Pt will have improved 5xSTS to 10 sec or < with no UE to demonstrate improved functional strength.   Baseline 8/23: 12.5 sec, was 19   Time 3   Period Weeks   Status Achieved           PT Long Term Goals - 02/23/17 1407      PT LONG TERM GOAL #1   Title Pt will have improved bil SLS to at least 10 sec with no UE support to maximize gait and demonstrate improved overall function.   Baseline 8/23: LLE: 11 sec or <, R: 7 sec or <   Time 6   Period Weeks   Status Achieved     PT LONG TERM GOAL #2   Title Pt will report an improved sitting tolerance to at least 1 hour or > to maximize her ability to participate in events at her church.   Baseline 8/23: 45 mins   Time 6   Period Weeks   Status Achieved     PT LONG TERM GOAL #3   Title Pt will report an improved standing tolerance to at least 30 mins or > to maximize her ability to cook meals at home.   Baseline 8/23: 15 mins, but pt reports that she took a few breaks but they weren't as long as usual   Time 6   Period Weeks   Status Achieved     PT LONG TERM GOAL #4   Title Pt will report an improved tolerance to walking to at least 30 mins or > to demonstrate improved overall function and allow her to go shopping with greater ease.   Baseline 8/23: 15 mins   Time 6   Period Weeks   Status Achieved               Plan - 02/23/17 1420    Clinical Impression Statement Pt comes today reporting she feels she has improved and ready for discharge.  All measures tested show improvement with residual weakness in Rt hip extensor and dorsiflexor.  Pt given additional copy of HEP and added hip abd/extension mat exericses per request.  pt without questions or   concerns.   Rehab Potential Fair   PT Frequency 2x / week   PT Duration 6 weeks   PT Treatment/Interventions  ADLs/Self Care Home Management;Cryotherapy;Electrical Stimulation;Moist Heat;Gait training;Stair training;Functional mobility training;Therapeutic activities;Therapeutic exercise;Balance training;Neuromuscular re-education;Cognitive remediation;Patient/family education;Manual techniques;Passive range of motion;Dry needling;Taping   PT Next Visit Plan Discharge to HEP as goals met and no longer requires skilled therapy need.   PT Home Exercise Plan eval: SKTC, HS stretch, wall slide; 9/10: lateral child's pose, sit <> stands, mini squats  9/13:  sidelying hip abd, prone hip extension   Consulted and Agree with Plan of Care Patient      Patient will benefit from skilled therapeutic intervention in order to improve the following deficits and impairments:  Decreased balance, Decreased range of motion, Decreased strength, Hypomobility, Increased muscle spasms, Impaired flexibility, Improper body mechanics, Postural dysfunction, Pain  Visit Diagnosis: Chronic bilateral low back pain with right-sided sciatica  Muscle weakness (generalized)  Other symptoms and signs involving the musculoskeletal system  Abnormal posture     Problem List Patient Active Problem List   Diagnosis Date Noted  . Invasive ductal carcinoma of left breast ([place]) 01/27/2014  . Diabetes mellitus due to underlying condition without complications ([place]) 73/71/0626  . Hypertension, essential, benign 12/24/2013   Teena Irani, PTA/CLT [id number]  Teena Irani 02/23/2017, 2:26 PM  [facility] [address] [place], [place], [address] Phone: [phone]   Fax:  [phone]  Name: Christine Sutton MRN: [id number] Date of Birth: Dec 05, 1929

## 2017-03-27 ENCOUNTER — Ambulatory Visit (HOSPITAL_COMMUNITY)
Admission: RE | Admit: 2017-03-27 | Discharge: 2017-03-27 | Disposition: A | Discharge status: Home / Self Care | Payer: Medicare Other | Source: Ambulatory Visit | Attending: Hematology & Oncology | Admitting: Hematology & Oncology

## 2017-03-27 DIAGNOSIS — C50912 Malignant neoplasm of unspecified site of left female breast: Secondary | ICD-10-CM

## 2017-03-27 DIAGNOSIS — Z1239 Encounter for other screening for malignant neoplasm of breast: Secondary | ICD-10-CM

## 2017-03-27 DIAGNOSIS — Z1231 Encounter for screening mammogram for malignant neoplasm of breast: Secondary | ICD-10-CM | POA: Diagnosis not present

## 2017-03-30 ENCOUNTER — Ambulatory Visit (HOSPITAL_COMMUNITY): Payer: Medicare Other | Admitting: Adult Health

## 2017-03-30 ENCOUNTER — Encounter (HOSPITAL_COMMUNITY): Payer: Medicare Other | Attending: Oncology | Admitting: Oncology

## 2017-03-30 ENCOUNTER — Encounter (HOSPITAL_COMMUNITY): Payer: Self-pay | Admitting: Oncology

## 2017-03-30 VITALS — BP 137/63 | HR 85 | Resp 16 | Ht 65.5 in | Wt 147.0 lb

## 2017-03-30 DIAGNOSIS — C50912 Malignant neoplasm of unspecified site of left female breast: Secondary | ICD-10-CM | POA: Diagnosis not present

## 2017-03-30 DIAGNOSIS — M818 Other osteoporosis without current pathological fracture: Secondary | ICD-10-CM

## 2017-03-30 DIAGNOSIS — Z170 Estrogen receptor positive status [ER+]: Secondary | ICD-10-CM | POA: Diagnosis not present

## 2017-03-30 NOTE — Progress Notes (Signed)
[facility] [place] NOTE  Patient Care Team: Asencion Noble, MD as PCP - General (Internal Medicine)  CHIEF COMPLAINTS/PURPOSE OF CONSULTATION:  ER positive carcinoma of the left breast, stage I disease Mastectomy with sentinel node biopsy on 03/08/2011 Osteoporosis with intolerance to the Evista   HISTORY OF PRESENTING ILLNESS:  Christine Sutton 83 y.o. female is here for Left breast cancer follow-up. She states she has been doing well since her last visit. She has her chronic back pains but otherwise has no complaints.  She denies any chest pain, shortness of breath, abdominal pain, or focal weakness. She had her right unilateral mammogram on 03/27/17 and it was negative for malignancy.   MEDICAL HISTORY:  Past Medical History:  Diagnosis Date  . Diabetes mellitus ([place])   . High blood cholesterol level   . HX: breast cancer    left  . Hypertension   . 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 BACK SURGERY     lower  . CATARACT EXTRACTION, BILATERAL  11/2014  . MASTECTOMY Left     SOCIAL HISTORY: Social History   Social History  . Marital status: Widowed    Spouse name: N/A  . Number of children: N/A  . Years of education: N/A   Occupational History  . Not on file.   Social History Main Topics  . Smoking status: Never Smoker  . Smokeless tobacco: Never Used  . Alcohol use No  . Drug use: No  . Sexual activity: Not on file   Other Topics Concern  . Not on file   Social History Narrative  . No narrative on file    FAMILY HISTORY: Family History  Problem Relation Age of Onset  . Cancer Mother   . Cancer Sister   . Cancer Sister    indicated that her mother is deceased. She indicated that both of her sisters are deceased.    ALLERGIES:  is allergic to codeine.  MEDICATIONS:  Current Outpatient Prescriptions  Medication  Sig Dispense Refill  . acetaminophen (TYLENOL) 500 MG tablet Take 500 mg by mouth as needed.    Marland Kitchen amLODipine (NORVASC) 5 MG tablet Take 5 mg by mouth daily.    Marland Kitchen aspirin 81 MG chewable tablet Chew 81 mg by mouth at bedtime.    . diphenhydramine-acetaminophen (TYLENOL PM) 25-500 MG TABS Take 1 tablet by mouth at bedtime as needed.    Marland Kitchen lisinopril-hydrochlorothiazide (PRINZIDE,ZESTORETIC) 20-25 MG per tablet Take 1 tablet by mouth 2 (two) times daily.     . metFORMIN (GLUCOPHAGE-XR) 500 MG 24 hr tablet TAKE 2 TABLETS EVERY DAY    . Multiple Vitamin (THERA) TABS Take by mouth.    . Multiple Vitamins-Minerals (CENTRUM SILVER PO) Take 1 capsule by mouth daily.    . polyethylene glycol (MIRALAX / GLYCOLAX) packet Take 17 g by mouth daily.     No current facility-administered medications for this visit.     Review of Systems  Musculoskeletal: Positive for back pain.       Pt had surgery on her back   All other systems reviewed and are negative. 14 point ROS was done and is otherwise as detailed above or in HPI  PHYSICAL EXAMINATION: ECOG PERFORMANCE STATUS: 0 - Asymptomatic  Vitals:   03/30/17 1347  BP: 137/63  Pulse: 85  Resp: 16  SpO2: 97%  Filed Weights   03/30/17 1347  Weight: 147 lb (66.7 kg)    Physical Exam  Constitutional: She is oriented to person, place, and time and well-developed, well-nourished, and in no distress.  HENT:  Head: Normocephalic and atraumatic.  Nose: Nose normal.  Mouth/Throat: Oropharynx is clear and moist. No oropharyngeal exudate.  Eyes: Pupils are equal, round, and reactive to light. Conjunctivae and EOM are normal. Right eye exhibits no discharge. Left eye exhibits no discharge. No scleral icterus.  Neck: Normal range of motion. Neck supple. No tracheal deviation present. No thyromegaly present.  Cardiovascular: Normal rate, regular rhythm and normal heart sounds.  Exam reveals no gallop and no friction rub.   No murmur heard. Pulmonary/Chest:  Effort normal and breath sounds normal. She has no wheezes. She has no rales.  Abdominal: Soft. Bowel sounds are normal. She exhibits no distension and no mass. There is no tenderness. There is no rebound and no guarding.  Musculoskeletal: Normal range of motion. She exhibits no edema.  Lymphadenopathy:    She has no cervical adenopathy.  Neurological: She is alert and oriented to person, place, and time. She has normal reflexes. No cranial nerve deficit. Gait normal. Coordination normal.  Skin: Skin is warm and dry. No rash noted.  Psychiatric: Mood, memory, affect and judgment normal.  Nursing note and vitals reviewed.  BREAST: Exam not done today per patient's wishes.   LABORATORY DATA:  I have reviewed the data as listed Lab Results  Component Value Date   WBC 10.4 06/11/2009   HGB 14.5 06/11/2009   HCT 43.0 06/11/2009   MCV 91.8 06/11/2009   PLT 328 06/11/2009     RADIOGRAPHIC STUDIES: I have personally reviewed the radiological images as listed and agreed with the findings in the report. CLINICAL DATA:  Screening.  EXAM: 2D DIGITAL SCREENING UNILATERAL RIGHT MAMMOGRAM WITH CAD AND ADJUNCT TOMO  COMPARISON:  Previous exam(s).  ACR Breast Density Category c: The breast tissue is heterogeneously dense, which may obscure small masses.  FINDINGS: There are no findings suspicious for malignancy. Images were processed with CAD.  IMPRESSION: No mammographic evidence of malignancy. A result letter of this screening mammogram will be mailed directly to the patient.  RECOMMENDATION: Screening mammogram in one year. (Code:SM-B-01Y)  BI-RADS CATEGORY  1: Negative.   Electronically Signed   By: Lovey Newcomer M.D.   On: 03/23/2016 16:20  ASSESSMENT & PLAN:  ER positive carcinoma of the left breast, stage I disease Mastectomy with sentinel node biopsy on 03/08/2011 Osteoporosis with intolerance to the Evista  Clinically NED. Recent R breast mammogram on  03/27/17 was negative. Repeat R breast mammogram in 03/2018; orders placed.  Breast exam not performed today since patient stated her PCP performed one recently and she had a negative mammogram recently.  Follow-up in 1 year with breast exam.  All questions were answered. The patient knows to call the clinic with any problems, questions or concerns.    This note was electronically signed.    Twana First, MD  03/30/2017 3:45 PM

## 2017-03-31 DIAGNOSIS — Z23 Encounter for immunization: Secondary | ICD-10-CM | POA: Diagnosis not present

## 2017-04-05 ENCOUNTER — Ambulatory Visit (HOSPITAL_COMMUNITY): Payer: Medicare Other

## 2017-04-06 DIAGNOSIS — J069 Acute upper respiratory infection, unspecified: Secondary | ICD-10-CM | POA: Diagnosis not present

## 2017-04-06 DIAGNOSIS — J209 Acute bronchitis, unspecified: Secondary | ICD-10-CM | POA: Diagnosis not present

## 2017-04-24 DIAGNOSIS — E119 Type 2 diabetes mellitus without complications: Secondary | ICD-10-CM | POA: Diagnosis not present

## 2017-05-01 DIAGNOSIS — Z853 Personal history of malignant neoplasm of breast: Secondary | ICD-10-CM | POA: Diagnosis not present

## 2017-05-01 DIAGNOSIS — I10 Essential (primary) hypertension: Secondary | ICD-10-CM | POA: Diagnosis not present

## 2017-05-01 DIAGNOSIS — E1129 Type 2 diabetes mellitus with other diabetic kidney complication: Secondary | ICD-10-CM | POA: Diagnosis not present

## 2017-06-20 DIAGNOSIS — M5416 Radiculopathy, lumbar region: Secondary | ICD-10-CM | POA: Diagnosis not present

## 2017-06-20 DIAGNOSIS — M5136 Other intervertebral disc degeneration, lumbar region: Secondary | ICD-10-CM | POA: Diagnosis not present

## 2017-06-20 DIAGNOSIS — I10 Essential (primary) hypertension: Secondary | ICD-10-CM | POA: Diagnosis not present

## 2017-06-20 DIAGNOSIS — M48062 Spinal stenosis, lumbar region with neurogenic claudication: Secondary | ICD-10-CM | POA: Diagnosis not present

## 2017-06-20 DIAGNOSIS — M4726 Other spondylosis with radiculopathy, lumbar region: Secondary | ICD-10-CM | POA: Diagnosis not present

## 2017-08-24 DIAGNOSIS — E1129 Type 2 diabetes mellitus with other diabetic kidney complication: Secondary | ICD-10-CM | POA: Diagnosis not present

## 2017-08-31 DIAGNOSIS — Z6825 Body mass index (BMI) 25.0-25.9, adult: Secondary | ICD-10-CM | POA: Diagnosis not present

## 2017-08-31 DIAGNOSIS — I10 Essential (primary) hypertension: Secondary | ICD-10-CM | POA: Diagnosis not present

## 2017-08-31 DIAGNOSIS — E1129 Type 2 diabetes mellitus with other diabetic kidney complication: Secondary | ICD-10-CM | POA: Diagnosis not present

## 2017-12-20 DIAGNOSIS — Q7649 Other congenital malformations of spine, not associated with scoliosis: Secondary | ICD-10-CM | POA: Diagnosis not present

## 2017-12-20 DIAGNOSIS — M5136 Other intervertebral disc degeneration, lumbar region: Secondary | ICD-10-CM | POA: Diagnosis not present

## 2017-12-20 DIAGNOSIS — M546 Pain in thoracic spine: Secondary | ICD-10-CM | POA: Diagnosis not present

## 2017-12-20 DIAGNOSIS — I10 Essential (primary) hypertension: Secondary | ICD-10-CM | POA: Diagnosis not present

## 2017-12-20 DIAGNOSIS — M4155 Other secondary scoliosis, thoracolumbar region: Secondary | ICD-10-CM | POA: Diagnosis not present

## 2017-12-20 DIAGNOSIS — M4726 Other spondylosis with radiculopathy, lumbar region: Secondary | ICD-10-CM | POA: Diagnosis not present

## 2017-12-20 DIAGNOSIS — M48062 Spinal stenosis, lumbar region with neurogenic claudication: Secondary | ICD-10-CM | POA: Diagnosis not present

## 2017-12-28 DIAGNOSIS — M48061 Spinal stenosis, lumbar region without neurogenic claudication: Secondary | ICD-10-CM | POA: Diagnosis not present

## 2017-12-28 DIAGNOSIS — M4726 Other spondylosis with radiculopathy, lumbar region: Secondary | ICD-10-CM | POA: Diagnosis not present

## 2017-12-28 DIAGNOSIS — M5136 Other intervertebral disc degeneration, lumbar region: Secondary | ICD-10-CM | POA: Diagnosis not present

## 2018-01-15 DIAGNOSIS — E119 Type 2 diabetes mellitus without complications: Secondary | ICD-10-CM | POA: Diagnosis not present

## 2018-01-15 DIAGNOSIS — G4700 Insomnia, unspecified: Secondary | ICD-10-CM | POA: Diagnosis not present

## 2018-01-15 DIAGNOSIS — E785 Hyperlipidemia, unspecified: Secondary | ICD-10-CM | POA: Diagnosis not present

## 2018-01-15 DIAGNOSIS — I10 Essential (primary) hypertension: Secondary | ICD-10-CM | POA: Diagnosis not present

## 2018-01-15 DIAGNOSIS — Z79899 Other long term (current) drug therapy: Secondary | ICD-10-CM | POA: Diagnosis not present

## 2018-01-22 ENCOUNTER — Other Ambulatory Visit (HOSPITAL_COMMUNITY): Payer: Self-pay | Admitting: Internal Medicine

## 2018-01-22 DIAGNOSIS — Z961 Presence of intraocular lens: Secondary | ICD-10-CM | POA: Diagnosis not present

## 2018-01-22 DIAGNOSIS — E1129 Type 2 diabetes mellitus with other diabetic kidney complication: Secondary | ICD-10-CM | POA: Diagnosis not present

## 2018-01-22 DIAGNOSIS — I10 Essential (primary) hypertension: Secondary | ICD-10-CM | POA: Diagnosis not present

## 2018-01-22 DIAGNOSIS — Z6824 Body mass index (BMI) 24.0-24.9, adult: Secondary | ICD-10-CM | POA: Diagnosis not present

## 2018-01-22 DIAGNOSIS — Z1231 Encounter for screening mammogram for malignant neoplasm of breast: Secondary | ICD-10-CM

## 2018-01-22 DIAGNOSIS — E119 Type 2 diabetes mellitus without complications: Secondary | ICD-10-CM | POA: Diagnosis not present

## 2018-01-22 DIAGNOSIS — N390 Urinary tract infection, site not specified: Secondary | ICD-10-CM | POA: Diagnosis not present

## 2018-02-26 ENCOUNTER — Ambulatory Visit (HOSPITAL_COMMUNITY)
Admission: RE | Admit: 2018-02-26 | Discharge: 2018-02-26 | Disposition: A | Discharge status: Home / Self Care | Payer: Medicare Other | Source: Ambulatory Visit | Attending: Internal Medicine | Admitting: Internal Medicine

## 2018-02-26 ENCOUNTER — Other Ambulatory Visit (HOSPITAL_COMMUNITY): Payer: Self-pay | Admitting: Internal Medicine

## 2018-02-26 DIAGNOSIS — S0990XA Unspecified injury of head, initial encounter: Secondary | ICD-10-CM | POA: Diagnosis not present

## 2018-02-26 DIAGNOSIS — G44311 Acute post-traumatic headache, intractable: Secondary | ICD-10-CM

## 2018-03-06 DIAGNOSIS — M5136 Other intervertebral disc degeneration, lumbar region: Secondary | ICD-10-CM | POA: Diagnosis not present

## 2018-03-06 DIAGNOSIS — I10 Essential (primary) hypertension: Secondary | ICD-10-CM | POA: Diagnosis not present

## 2018-03-06 DIAGNOSIS — M5416 Radiculopathy, lumbar region: Secondary | ICD-10-CM | POA: Diagnosis not present

## 2018-03-06 DIAGNOSIS — M4155 Other secondary scoliosis, thoracolumbar region: Secondary | ICD-10-CM | POA: Diagnosis not present

## 2018-03-06 DIAGNOSIS — Q7649 Other congenital malformations of spine, not associated with scoliosis: Secondary | ICD-10-CM | POA: Diagnosis not present

## 2018-03-06 DIAGNOSIS — M4726 Other spondylosis with radiculopathy, lumbar region: Secondary | ICD-10-CM | POA: Diagnosis not present

## 2018-03-06 DIAGNOSIS — M48062 Spinal stenosis, lumbar region with neurogenic claudication: Secondary | ICD-10-CM | POA: Diagnosis not present

## 2018-03-08 DIAGNOSIS — M48062 Spinal stenosis, lumbar region with neurogenic claudication: Secondary | ICD-10-CM | POA: Diagnosis not present

## 2018-03-08 DIAGNOSIS — M48061 Spinal stenosis, lumbar region without neurogenic claudication: Secondary | ICD-10-CM | POA: Diagnosis not present

## 2018-03-19 DIAGNOSIS — M4726 Other spondylosis with radiculopathy, lumbar region: Secondary | ICD-10-CM | POA: Diagnosis not present

## 2018-03-19 DIAGNOSIS — M5136 Other intervertebral disc degeneration, lumbar region: Secondary | ICD-10-CM | POA: Diagnosis not present

## 2018-03-19 DIAGNOSIS — M5416 Radiculopathy, lumbar region: Secondary | ICD-10-CM | POA: Diagnosis not present

## 2018-03-19 DIAGNOSIS — Q7649 Other congenital malformations of spine, not associated with scoliosis: Secondary | ICD-10-CM | POA: Diagnosis not present

## 2018-03-19 DIAGNOSIS — M4155 Other secondary scoliosis, thoracolumbar region: Secondary | ICD-10-CM | POA: Diagnosis not present

## 2018-03-19 DIAGNOSIS — I10 Essential (primary) hypertension: Secondary | ICD-10-CM | POA: Diagnosis not present

## 2018-03-19 DIAGNOSIS — M47816 Spondylosis without myelopathy or radiculopathy, lumbar region: Secondary | ICD-10-CM | POA: Diagnosis not present

## 2018-03-23 DIAGNOSIS — Z23 Encounter for immunization: Secondary | ICD-10-CM | POA: Diagnosis not present

## 2018-03-28 ENCOUNTER — Ambulatory Visit (HOSPITAL_COMMUNITY)
Admission: RE | Admit: 2018-03-28 | Discharge: 2018-03-28 | Disposition: A | Discharge status: Home / Self Care | Payer: Medicare Other | Source: Ambulatory Visit | Attending: Internal Medicine | Admitting: Internal Medicine

## 2018-03-28 DIAGNOSIS — Z1231 Encounter for screening mammogram for malignant neoplasm of breast: Secondary | ICD-10-CM | POA: Diagnosis not present

## 2018-04-06 ENCOUNTER — Ambulatory Visit (HOSPITAL_COMMUNITY): Payer: Medicare Other

## 2018-04-11 ENCOUNTER — Encounter (HOSPITAL_COMMUNITY): Payer: Self-pay | Admitting: Hematology

## 2018-04-11 ENCOUNTER — Inpatient Hospital Stay (HOSPITAL_COMMUNITY): Payer: Medicare Other | Attending: Hematology | Admitting: Hematology

## 2018-04-11 DIAGNOSIS — Z170 Estrogen receptor positive status [ER+]: Secondary | ICD-10-CM | POA: Insufficient documentation

## 2018-04-11 DIAGNOSIS — Z9012 Acquired absence of left breast and nipple: Secondary | ICD-10-CM

## 2018-04-11 DIAGNOSIS — E7800 Pure hypercholesterolemia, unspecified: Secondary | ICD-10-CM | POA: Insufficient documentation

## 2018-04-11 DIAGNOSIS — E119 Type 2 diabetes mellitus without complications: Secondary | ICD-10-CM

## 2018-04-11 DIAGNOSIS — I10 Essential (primary) hypertension: Secondary | ICD-10-CM | POA: Diagnosis not present

## 2018-04-11 DIAGNOSIS — Z7982 Long term (current) use of aspirin: Secondary | ICD-10-CM | POA: Insufficient documentation

## 2018-04-11 DIAGNOSIS — C50912 Malignant neoplasm of unspecified site of left female breast: Secondary | ICD-10-CM | POA: Diagnosis not present

## 2018-04-11 DIAGNOSIS — Z7984 Long term (current) use of oral hypoglycemic drugs: Secondary | ICD-10-CM | POA: Insufficient documentation

## 2018-04-11 DIAGNOSIS — Z79899 Other long term (current) drug therapy: Secondary | ICD-10-CM | POA: Insufficient documentation

## 2018-04-11 NOTE — Progress Notes (Signed)
[place], [place] [address]   CLINIC:  Medical Oncology/Hematology  PCP:  Asencion Noble, MD [address] [place] [address] [phone]   REASON FOR VISIT: Follow-up for left breast cancer, ER+/PR-/HER2-  CURRENT THERAPY: Observation   INTERVAL HISTORY:  Ms. Wingler 84 y.o. female returns for routine follow-up for left breast cancer. She is doing well and having no problems or complaints at this time. She lives at home and performs all his own ADLs and activities. She is trying to remain active. She denies any new pains or lumps. Denies any nausea, vomtiing, or diarrhea. Denies any bleeding or easy bruising. Patient reports her appetite at 100% and she has no problem maintaining her weight. Her energy is 75%.     REVIEW OF SYSTEMS:  Review of Systems  All other systems reviewed and are negative.    PAST MEDICAL/SURGICAL HISTORY:  Past Medical History:  Diagnosis Date  . Diabetes mellitus ([facility])   . High blood cholesterol level   . HX: breast cancer    left  . Hypertension   . Invasive ductal carcinoma of left breast ([facility]) 01/27/2014   ER +   Past Surgical History:  Procedure Laterality Date  . BACK SURGERY     lower  . CATARACT EXTRACTION, BILATERAL  11/2014  . MASTECTOMY Left      SOCIAL HISTORY:  Social History   Socioeconomic History  . Marital status: Widowed    Spouse name: Not on file  . Number of children: Not on file  . Years of education: Not on file  . Highest education level: Not on file  Occupational History  . Not on file  Social Needs  . Financial resource strain: Not on file  . Food insecurity:    Worry: Not on file    Inability: Not on file  . Transportation needs:    Medical: Not on file    Non-medical: Not on file  Tobacco Use  . Smoking status: Never Smoker  . Smokeless tobacco: Never Used  Substance and Sexual Activity  . Alcohol use: No  . Drug use: No  . Sexual activity: Not  on file  Lifestyle  . Physical activity:    Days per week: Not on file    Minutes per session: Not on file  . Stress: Not on file  Relationships  . Social connections:    Talks on phone: Not on file    Gets together: Not on file    Attends religious service: Not on file    Active member of club or organization: Not on file    Attends meetings of clubs or organizations: Not on file    Relationship status: Not on file  . Intimate partner violence:    Fear of current or ex partner: Not on file    Emotionally abused: Not on file    Physically abused: Not on file    Forced sexual activity: Not on file  Other Topics Concern  . Not on file  Social History Narrative  . Not on file    FAMILY HISTORY:  Family History  Problem Relation Age of Onset  . Cancer Mother   . Cancer Sister   . Cancer Sister     CURRENT MEDICATIONS:  Outpatient Encounter Medications as of 04/11/2018  Medication Sig Note  . acetaminophen (TYLENOL) 500 MG tablet Take 500 mg by mouth as needed. 12/24/2013: Received from: [facility]:   .  amLODipine (NORVASC) 5 MG tablet Take 5 mg by mouth daily. 12/24/2013: Received from: [facility]:   . aspirin 81 MG chewable tablet Chew 81 mg by mouth at bedtime. 12/24/2013: Received from: [facility]:   . diphenhydramine-acetaminophen (TYLENOL PM) 25-500 MG TABS Take 1 tablet by mouth at bedtime as needed.   Marland Kitchen lisinopril-hydrochlorothiazide (PRINZIDE,ZESTORETIC) 20-25 MG per tablet Take 1 tablet by mouth 2 (two) times daily.  12/24/2013: Received from: [facility]:   . metFORMIN (GLUCOPHAGE-XR) 500 MG 24 hr tablet TAKE 2 TABLETS EVERY DAY   . Multiple Vitamin (THERA) TABS Take by mouth.   . Multiple Vitamins-Minerals (CENTRUM SILVER PO) Take 1 capsule by mouth daily.   . polyethylene glycol (MIRALAX / GLYCOLAX) packet Take 17 g by mouth daily. 12/24/2013: Received from: [facility]:    No  facility-administered encounter medications on file as of 04/11/2018.     ALLERGIES:  Allergies  Allergen Reactions  . Codeine Nausea And Vomiting     PHYSICAL EXAM:  ECOG Performance status: 1  Vitals:   04/11/18 1421  BP: (!) 150/68  Pulse: 88  Resp: 18  Temp: 98.1 F (36.7 C)  SpO2: 95%   Filed Weights   04/11/18 1421  Weight: 142 lb (64.4 kg)    Physical Exam  Constitutional: She is oriented to person, place, and time. She appears well-developed and well-nourished.  Abdominal: Soft.  Musculoskeletal: Normal range of motion.  Neurological: She is alert and oriented to person, place, and time.  Skin: Skin is warm and dry.  Psychiatric: She has a normal mood and affect. Her behavior is normal. Judgment and thought content normal.  Breast: Left: mastectomy, no palpable mass, no redness or swelling.               Right: No palpable masses, no skin changes or nipple discharge, no adenopathy. No axillary adenopathy.  LABORATORY DATA:  I have reviewed the labs as listed.  CBC    Component Value Date/Time   WBC 10.4 06/11/2009 1119   RBC 4.68 06/11/2009 1119   HGB 14.5 06/11/2009 1119   HCT 43.0 06/11/2009 1119   PLT 328 06/11/2009 1119   MCV 91.8 06/11/2009 1119   MCHC 33.7 06/11/2009 1119   RDW 14.2 06/11/2009 1119   CMP Latest Ref Rng & Units 06/11/2009  Glucose 70 - 99 mg/dL 123(H)  BUN 6 - 23 mg/dL 19  Creatinine 0.4 - 1.2 mg/dL 0.72  Sodium 135 - 145 mEq/L 139  Potassium 3.5 - 5.1 mEq/L 4.3  Chloride 96 - 112 mEq/L 99  CO2 19 - 32 mEq/L 33(H)  Calcium 8.4 - 10.5 mg/dL 10.7(H)       DIAGNOSTIC IMAGING:  I have reviewed her mammogram dated 03/28/2018 which was BI-RADS Category 1.    ASSESSMENT & PLAN:   Invasive ductal carcinoma of left breast 1.  Stage I left breast cancer ER/PR positive and HER-2 negative: - She had mastectomy in 2012. - Mammogram reviewed by me on 03/28/2018 was BI-RADS Category 1 of the right breast. -Today's examination  of the left mastectomy site was within normal limits.  Right breast has no palpable masses.  No palpable adenopathy. - She will come back for follow-up in a year.      Orders placed this encounter:  Orders Placed This Encounter  Procedures  . Cancer antigen 27.29  . CEA  . CBC with Differential/Platelet  . Comprehensive metabolic panel  Derek Jack, MD [facility] [phone]

## 2018-04-11 NOTE — Patient Instructions (Signed)
[facility] at [facility] Discharge Instructions  Follow up in 1 year    Thank you for choosing [facility] at [facility] to provide your oncology and hematology care.  To afford each patient quality time with our provider, please arrive at least 15 minutes before your scheduled appointment time.   If you have a lab appointment with the Cancer Center please come in thru the  Main Entrance and check in at the main information desk  You need to re-schedule your appointment should you arrive 10 or more minutes late.  We strive to give you quality time with our providers, and arriving late affects you and other patients whose appointments are after yours.  Also, if you no show three or more times for appointments you may be dismissed from the clinic at the providers discretion.     Again, thank you for choosing [facility].  Our hope is that these requests will decrease the amount of time that you wait before being seen by our physicians.       _____________________________________________________________  Should you have questions after your visit to [facility], please contact our office at [phone] between the hours of 8:00 a.m. and 4:30 p.m.  Voicemails left after 4:00 p.m. will not be returned until the following business day.  For prescription refill requests, have your pharmacy contact our office and allow 72 hours.    Cancer Center Support Programs:   > Cancer Support Group  2nd Tuesday of the month 1pm-2pm, Journey Room    

## 2018-04-11 NOTE — Assessment & Plan Note (Addendum)
1.  Stage I left breast cancer ER/PR positive and HER-2 negative: - She had mastectomy in 2012. - Mammogram reviewed by me on 03/28/2018 was BI-RADS Category 1 of the right breast. -Today's examination of the left mastectomy site was within normal limits.  Right breast has no palpable masses.  No palpable adenopathy. - She will come back for follow-up in a year.

## 2018-04-19 DIAGNOSIS — M545 Low back pain: Secondary | ICD-10-CM | POA: Diagnosis not present

## 2018-04-19 DIAGNOSIS — M47816 Spondylosis without myelopathy or radiculopathy, lumbar region: Secondary | ICD-10-CM | POA: Diagnosis not present

## 2018-05-21 DIAGNOSIS — E1129 Type 2 diabetes mellitus with other diabetic kidney complication: Secondary | ICD-10-CM | POA: Diagnosis not present

## 2018-05-28 DIAGNOSIS — I10 Essential (primary) hypertension: Secondary | ICD-10-CM | POA: Diagnosis not present

## 2018-05-28 DIAGNOSIS — E1159 Type 2 diabetes mellitus with other circulatory complications: Secondary | ICD-10-CM | POA: Diagnosis not present

## 2018-05-28 DIAGNOSIS — M545 Low back pain: Secondary | ICD-10-CM | POA: Diagnosis not present

## 2018-06-19 DIAGNOSIS — M4155 Other secondary scoliosis, thoracolumbar region: Secondary | ICD-10-CM | POA: Diagnosis not present

## 2018-06-19 DIAGNOSIS — M48062 Spinal stenosis, lumbar region with neurogenic claudication: Secondary | ICD-10-CM | POA: Diagnosis not present

## 2018-06-19 DIAGNOSIS — Q7649 Other congenital malformations of spine, not associated with scoliosis: Secondary | ICD-10-CM | POA: Diagnosis not present

## 2018-06-19 DIAGNOSIS — M4726 Other spondylosis with radiculopathy, lumbar region: Secondary | ICD-10-CM | POA: Diagnosis not present

## 2018-06-19 DIAGNOSIS — M5136 Other intervertebral disc degeneration, lumbar region: Secondary | ICD-10-CM | POA: Diagnosis not present

## 2018-06-19 DIAGNOSIS — M47816 Spondylosis without myelopathy or radiculopathy, lumbar region: Secondary | ICD-10-CM | POA: Diagnosis not present

## 2018-06-19 DIAGNOSIS — M5416 Radiculopathy, lumbar region: Secondary | ICD-10-CM | POA: Diagnosis not present

## 2018-06-21 DIAGNOSIS — M4726 Other spondylosis with radiculopathy, lumbar region: Secondary | ICD-10-CM | POA: Diagnosis not present

## 2018-06-21 DIAGNOSIS — M48061 Spinal stenosis, lumbar region without neurogenic claudication: Secondary | ICD-10-CM | POA: Diagnosis not present

## 2018-06-21 DIAGNOSIS — M5136 Other intervertebral disc degeneration, lumbar region: Secondary | ICD-10-CM | POA: Diagnosis not present

## 2018-07-16 DIAGNOSIS — J019 Acute sinusitis, unspecified: Secondary | ICD-10-CM | POA: Diagnosis not present

## 2018-08-20 DIAGNOSIS — N390 Urinary tract infection, site not specified: Secondary | ICD-10-CM | POA: Diagnosis not present

## 2018-08-20 DIAGNOSIS — N3000 Acute cystitis without hematuria: Secondary | ICD-10-CM | POA: Diagnosis not present

## 2018-08-20 DIAGNOSIS — R350 Frequency of micturition: Secondary | ICD-10-CM | POA: Diagnosis not present

## 2019-01-24 DIAGNOSIS — Z961 Presence of intraocular lens: Secondary | ICD-10-CM | POA: Diagnosis not present

## 2019-01-24 DIAGNOSIS — E119 Type 2 diabetes mellitus without complications: Secondary | ICD-10-CM | POA: Diagnosis not present

## 2019-02-04 DIAGNOSIS — M1990 Unspecified osteoarthritis, unspecified site: Secondary | ICD-10-CM | POA: Diagnosis not present

## 2019-02-04 DIAGNOSIS — E785 Hyperlipidemia, unspecified: Secondary | ICD-10-CM | POA: Diagnosis not present

## 2019-02-04 DIAGNOSIS — E1129 Type 2 diabetes mellitus with other diabetic kidney complication: Secondary | ICD-10-CM | POA: Diagnosis not present

## 2019-02-04 DIAGNOSIS — Z79899 Other long term (current) drug therapy: Secondary | ICD-10-CM | POA: Diagnosis not present

## 2019-02-04 DIAGNOSIS — I10 Essential (primary) hypertension: Secondary | ICD-10-CM | POA: Diagnosis not present

## 2019-02-04 DIAGNOSIS — E871 Hypo-osmolality and hyponatremia: Secondary | ICD-10-CM | POA: Diagnosis not present

## 2019-02-11 ENCOUNTER — Other Ambulatory Visit (HOSPITAL_COMMUNITY): Payer: Self-pay | Admitting: Internal Medicine

## 2019-02-11 DIAGNOSIS — Z853 Personal history of malignant neoplasm of breast: Secondary | ICD-10-CM | POA: Diagnosis not present

## 2019-02-11 DIAGNOSIS — N390 Urinary tract infection, site not specified: Secondary | ICD-10-CM | POA: Diagnosis not present

## 2019-02-11 DIAGNOSIS — E1129 Type 2 diabetes mellitus with other diabetic kidney complication: Secondary | ICD-10-CM | POA: Diagnosis not present

## 2019-02-11 DIAGNOSIS — Z1231 Encounter for screening mammogram for malignant neoplasm of breast: Secondary | ICD-10-CM

## 2019-02-11 DIAGNOSIS — M1990 Unspecified osteoarthritis, unspecified site: Secondary | ICD-10-CM

## 2019-02-11 DIAGNOSIS — I10 Essential (primary) hypertension: Secondary | ICD-10-CM | POA: Diagnosis not present

## 2019-02-22 ENCOUNTER — Other Ambulatory Visit (HOSPITAL_COMMUNITY): Payer: Self-pay | Admitting: Internal Medicine

## 2019-02-22 ENCOUNTER — Encounter (HOSPITAL_COMMUNITY): Payer: Self-pay

## 2019-02-22 ENCOUNTER — Other Ambulatory Visit: Payer: Self-pay

## 2019-02-22 ENCOUNTER — Ambulatory Visit (HOSPITAL_COMMUNITY)
Admission: RE | Admit: 2019-02-22 | Discharge: 2019-02-22 | Disposition: A | Discharge status: Home / Self Care | Payer: Medicare Other | Source: Ambulatory Visit | Attending: Internal Medicine | Admitting: Internal Medicine

## 2019-02-22 DIAGNOSIS — M545 Low back pain, unspecified: Secondary | ICD-10-CM

## 2019-02-22 DIAGNOSIS — M4124 Other idiopathic scoliosis, thoracic region: Secondary | ICD-10-CM | POA: Diagnosis not present

## 2019-02-27 ENCOUNTER — Other Ambulatory Visit (HOSPITAL_COMMUNITY): Payer: Medicare Other

## 2019-03-15 DIAGNOSIS — Z23 Encounter for immunization: Secondary | ICD-10-CM | POA: Diagnosis not present

## 2019-04-01 ENCOUNTER — Other Ambulatory Visit: Payer: Self-pay

## 2019-04-01 ENCOUNTER — Encounter (HOSPITAL_COMMUNITY): Payer: Self-pay

## 2019-04-01 ENCOUNTER — Ambulatory Visit (HOSPITAL_COMMUNITY)
Admission: RE | Admit: 2019-04-01 | Discharge: 2019-04-01 | Disposition: A | Discharge status: Home / Self Care | Payer: Medicare Other | Source: Ambulatory Visit | Attending: Internal Medicine | Admitting: Internal Medicine

## 2019-04-01 DIAGNOSIS — Z1231 Encounter for screening mammogram for malignant neoplasm of breast: Secondary | ICD-10-CM | POA: Diagnosis not present

## 2019-04-17 ENCOUNTER — Ambulatory Visit (HOSPITAL_COMMUNITY): Payer: Medicare Other | Admitting: Hematology

## 2019-08-20 DIAGNOSIS — Z23 Encounter for immunization: Secondary | ICD-10-CM | POA: Diagnosis not present

## 2019-09-17 DIAGNOSIS — Z23 Encounter for immunization: Secondary | ICD-10-CM | POA: Diagnosis not present

## 2019-10-04 ENCOUNTER — Encounter: Payer: Self-pay | Admitting: Adult Health

## 2019-10-04 DIAGNOSIS — E1129 Type 2 diabetes mellitus with other diabetic kidney complication: Secondary | ICD-10-CM | POA: Diagnosis not present

## 2019-10-04 DIAGNOSIS — N3000 Acute cystitis without hematuria: Secondary | ICD-10-CM | POA: Diagnosis not present

## 2019-10-11 DIAGNOSIS — E1129 Type 2 diabetes mellitus with other diabetic kidney complication: Secondary | ICD-10-CM | POA: Diagnosis not present

## 2019-10-11 DIAGNOSIS — N760 Acute vaginitis: Secondary | ICD-10-CM | POA: Diagnosis not present

## 2019-10-11 DIAGNOSIS — M545 Low back pain: Secondary | ICD-10-CM | POA: Diagnosis not present

## 2019-10-11 DIAGNOSIS — Z6824 Body mass index (BMI) 24.0-24.9, adult: Secondary | ICD-10-CM | POA: Diagnosis not present

## 2019-10-17 ENCOUNTER — Telehealth: Payer: Self-pay | Admitting: Adult Health

## 2019-10-17 NOTE — Telephone Encounter (Signed)

## 2019-10-18 ENCOUNTER — Encounter: Payer: Self-pay | Admitting: Adult Health

## 2019-10-18 ENCOUNTER — Ambulatory Visit (INDEPENDENT_AMBULATORY_CARE_PROVIDER_SITE_OTHER): Payer: Medicare Other | Admitting: Adult Health

## 2019-10-18 ENCOUNTER — Other Ambulatory Visit: Payer: Self-pay

## 2019-10-18 VITALS — BP 150/82 | HR 75 | Ht 63.5 in | Wt 144.0 lb

## 2019-10-18 DIAGNOSIS — R10814 Left lower quadrant abdominal tenderness: Secondary | ICD-10-CM | POA: Insufficient documentation

## 2019-10-18 DIAGNOSIS — L292 Pruritus vulvae: Secondary | ICD-10-CM | POA: Diagnosis not present

## 2019-10-18 DIAGNOSIS — Z853 Personal history of malignant neoplasm of breast: Secondary | ICD-10-CM

## 2019-10-18 DIAGNOSIS — Z1211 Encounter for screening for malignant neoplasm of colon: Secondary | ICD-10-CM | POA: Diagnosis not present

## 2019-10-18 DIAGNOSIS — Z1212 Encounter for screening for malignant neoplasm of rectum: Secondary | ICD-10-CM | POA: Diagnosis not present

## 2019-10-18 DIAGNOSIS — R102 Pelvic and perineal pain: Secondary | ICD-10-CM | POA: Insufficient documentation

## 2019-10-18 LAB — HEMOCCULT GUIAC POC 1CARD (OFFICE): Fecal Occult Blood, POC: NEGATIVE

## 2019-10-18 MED ORDER — NYSTATIN-TRIAMCINOLONE 100000-0.1 UNIT/GM-% EX OINT: 1.0000 "application " | TOPICAL_OINTMENT | Freq: Two times a day (BID) | CUTANEOUS | 1 refills | Status: DC

## 2019-10-18 NOTE — Progress Notes (Addendum)
  Subjective:     Patient ID: KORY DIMARZIO, female   DOB: 1930-05-24, 86 y.o.   MRN: [id number]  HPI Arthur is a 86 year old white female,widowed, sp hysterectomy with history of breast cancer, referred by Dr Willey Blade, for ?vaginitis. She is diabetic, last A1c 6.2 and she is on metformin. She has  back pain. She had negative urine at Dr Ria Comment. She lives alone and still does her house work and cooks, still drives.She had hair done this morning.  PCP is Dr Willey Blade.   Review of Systems Has itching of vulva Has pelvic pressure like needs to have BM Reviewed past medical,surgical, social and family history. Reviewed medications and allergies.     Objective:   Physical Exam BP (!) 150/82 (BP Location: Right Arm, Patient Position: Sitting, Cuff Size: Normal)   Pulse 75   Ht 5' 3.5" (1.613 m)   Wt 144 lb (65.3 kg)   BMI 25.11 kg/m   AA 0  Fall risk is low PHQ 9 score is 1 Skin warm and dry. Lungs: clear to ausculation bilaterally. Cardiovascular: regular rate and rhythm. Pelvic: external genitalia is normal in appearance no lesions, vagina:pale with loss of moisture and rugae,,urethra has no lesions or masses noted, cervix and uterus are absent, adnexa: no masses, LLQ tenderness noted. Bladder is non tender and no masses felt. On rectal exam, no masses, polyps or hemorrhoids felt, and hemoccult was negative. Examination chaperoned by Rolena Infante LPN.     Assessment:     1. Vulvar itching Will Rx mytrex  Meds ordered this encounter  Medications  . nystatin-triamcinolone ointment (MYCOLOG)    Sig: Apply 1 application topically 2 (two) times daily.    Dispense:  30 g    Refill:  1    Order Specific Question:   Supervising Provider    Answer:   Elonda Husky, LUTHER H [[id number]]    2. Left lower quadrant abdominal tenderness without rebound tenderness Will get GYN [place] to assess in about a week,will talk when results back   3. Pelvic pressure in female Will get GYN [place]  4. History of breast  cancer  5. Screening for colorectal cancer Hemoccult negative     Plan:     Will follow up in about 2 weeks to recheck vulva

## 2019-10-23 ENCOUNTER — Telehealth: Payer: Self-pay | Admitting: Obstetrics and Gynecology

## 2019-10-23 NOTE — Telephone Encounter (Signed)

## 2019-10-24 ENCOUNTER — Ambulatory Visit (INDEPENDENT_AMBULATORY_CARE_PROVIDER_SITE_OTHER): Payer: Medicare Other

## 2019-10-24 ENCOUNTER — Other Ambulatory Visit: Payer: Self-pay

## 2019-10-24 DIAGNOSIS — R102 Pelvic and perineal pain: Secondary | ICD-10-CM

## 2019-10-24 DIAGNOSIS — Z90710 Acquired absence of both cervix and uterus: Secondary | ICD-10-CM | POA: Diagnosis not present

## 2019-10-24 DIAGNOSIS — R10814 Left lower quadrant abdominal tenderness: Secondary | ICD-10-CM | POA: Diagnosis not present

## 2019-10-24 NOTE — Progress Notes (Signed)
PELVIC US TA/TV:normal vaginal cuff,ovaries not visualized,bilat adnexa's wnl,no free fluid,no pain during ultrasound  Chaperone Marcie Bal

## 2019-10-28 ENCOUNTER — Telehealth: Payer: Self-pay | Admitting: Adult Health

## 2019-10-28 NOTE — Telephone Encounter (Signed)
Pt aware that [place] was normal, no masses

## 2019-10-31 ENCOUNTER — Ambulatory Visit: Payer: Medicare Other | Admitting: Adult Health

## 2020-01-30 DIAGNOSIS — E119 Type 2 diabetes mellitus without complications: Secondary | ICD-10-CM | POA: Diagnosis not present

## 2020-01-30 DIAGNOSIS — Z961 Presence of intraocular lens: Secondary | ICD-10-CM | POA: Diagnosis not present

## 2020-01-30 DIAGNOSIS — Z7984 Long term (current) use of oral hypoglycemic drugs: Secondary | ICD-10-CM | POA: Diagnosis not present

## 2020-02-03 DIAGNOSIS — E785 Hyperlipidemia, unspecified: Secondary | ICD-10-CM | POA: Diagnosis not present

## 2020-02-03 DIAGNOSIS — Z79899 Other long term (current) drug therapy: Secondary | ICD-10-CM | POA: Diagnosis not present

## 2020-02-03 DIAGNOSIS — I10 Essential (primary) hypertension: Secondary | ICD-10-CM | POA: Diagnosis not present

## 2020-02-03 DIAGNOSIS — E1129 Type 2 diabetes mellitus with other diabetic kidney complication: Secondary | ICD-10-CM | POA: Diagnosis not present

## 2020-02-03 DIAGNOSIS — C50912 Malignant neoplasm of unspecified site of left female breast: Secondary | ICD-10-CM | POA: Diagnosis not present

## 2020-02-03 DIAGNOSIS — M1990 Unspecified osteoarthritis, unspecified site: Secondary | ICD-10-CM | POA: Diagnosis not present

## 2020-02-11 DIAGNOSIS — N390 Urinary tract infection, site not specified: Secondary | ICD-10-CM | POA: Diagnosis not present

## 2020-02-11 DIAGNOSIS — Z0001 Encounter for general adult medical examination with abnormal findings: Secondary | ICD-10-CM | POA: Diagnosis not present

## 2020-02-11 DIAGNOSIS — G720 Drug-induced myopathy: Secondary | ICD-10-CM | POA: Diagnosis not present

## 2020-02-11 DIAGNOSIS — E1122 Type 2 diabetes mellitus with diabetic chronic kidney disease: Secondary | ICD-10-CM | POA: Diagnosis not present

## 2020-02-11 DIAGNOSIS — R7309 Other abnormal glucose: Secondary | ICD-10-CM | POA: Diagnosis not present

## 2020-02-11 DIAGNOSIS — I10 Essential (primary) hypertension: Secondary | ICD-10-CM | POA: Diagnosis not present

## 2020-02-25 ENCOUNTER — Other Ambulatory Visit (HOSPITAL_COMMUNITY): Payer: Self-pay | Admitting: Internal Medicine

## 2020-02-25 DIAGNOSIS — Z1231 Encounter for screening mammogram for malignant neoplasm of breast: Secondary | ICD-10-CM

## 2020-03-20 DIAGNOSIS — Z23 Encounter for immunization: Secondary | ICD-10-CM | POA: Diagnosis not present

## 2020-04-03 ENCOUNTER — Ambulatory Visit (HOSPITAL_COMMUNITY): Payer: Medicare Other

## 2020-04-03 ENCOUNTER — Other Ambulatory Visit (HOSPITAL_COMMUNITY): Payer: Medicare Other

## 2020-04-03 ENCOUNTER — Ambulatory Visit (HOSPITAL_COMMUNITY)
Admission: RE | Admit: 2020-04-03 | Discharge: 2020-04-03 | Disposition: A | Discharge status: Home / Self Care | Payer: Medicare Other | Source: Ambulatory Visit | Attending: Internal Medicine | Admitting: Internal Medicine

## 2020-04-03 ENCOUNTER — Other Ambulatory Visit: Payer: Self-pay

## 2020-04-03 DIAGNOSIS — Z13820 Encounter for screening for osteoporosis: Secondary | ICD-10-CM | POA: Diagnosis not present

## 2020-04-03 DIAGNOSIS — Z1231 Encounter for screening mammogram for malignant neoplasm of breast: Secondary | ICD-10-CM

## 2020-04-03 DIAGNOSIS — M1990 Unspecified osteoarthritis, unspecified site: Secondary | ICD-10-CM | POA: Diagnosis not present

## 2020-04-03 DIAGNOSIS — Z780 Asymptomatic menopausal state: Secondary | ICD-10-CM | POA: Insufficient documentation

## 2020-04-03 DIAGNOSIS — R29890 Loss of height: Secondary | ICD-10-CM | POA: Diagnosis not present

## 2020-04-03 DIAGNOSIS — Z853 Personal history of malignant neoplasm of breast: Secondary | ICD-10-CM | POA: Insufficient documentation

## 2020-04-03 DIAGNOSIS — M85851 Other specified disorders of bone density and structure, right thigh: Secondary | ICD-10-CM | POA: Diagnosis not present

## 2020-04-08 ENCOUNTER — Other Ambulatory Visit (HOSPITAL_COMMUNITY): Payer: Self-pay | Admitting: Internal Medicine

## 2020-04-08 DIAGNOSIS — R928 Other abnormal and inconclusive findings on diagnostic imaging of breast: Secondary | ICD-10-CM

## 2020-04-14 ENCOUNTER — Other Ambulatory Visit: Payer: Self-pay

## 2020-04-14 ENCOUNTER — Ambulatory Visit (HOSPITAL_COMMUNITY)
Admission: RE | Admit: 2020-04-14 | Discharge: 2020-04-14 | Disposition: A | Discharge status: Home / Self Care | Payer: Medicare Other | Source: Ambulatory Visit | Attending: Internal Medicine | Admitting: Internal Medicine

## 2020-04-14 ENCOUNTER — Other Ambulatory Visit (HOSPITAL_COMMUNITY): Payer: Self-pay | Admitting: Internal Medicine

## 2020-04-14 DIAGNOSIS — Z853 Personal history of malignant neoplasm of breast: Secondary | ICD-10-CM | POA: Diagnosis not present

## 2020-04-14 DIAGNOSIS — R928 Other abnormal and inconclusive findings on diagnostic imaging of breast: Secondary | ICD-10-CM

## 2020-04-14 DIAGNOSIS — R922 Inconclusive mammogram: Secondary | ICD-10-CM | POA: Diagnosis not present

## 2020-04-17 ENCOUNTER — Other Ambulatory Visit: Payer: Self-pay

## 2020-04-17 ENCOUNTER — Ambulatory Visit (HOSPITAL_COMMUNITY)
Admission: RE | Admit: 2020-04-17 | Discharge: 2020-04-17 | Disposition: A | Discharge status: Home / Self Care | Payer: Medicare Other | Source: Ambulatory Visit | Attending: Internal Medicine | Admitting: Internal Medicine

## 2020-04-17 ENCOUNTER — Encounter (HOSPITAL_COMMUNITY): Payer: Self-pay

## 2020-04-17 DIAGNOSIS — N6011 Diffuse cystic mastopathy of right breast: Secondary | ICD-10-CM | POA: Diagnosis not present

## 2020-04-17 DIAGNOSIS — C50411 Malignant neoplasm of upper-outer quadrant of right female breast: Secondary | ICD-10-CM | POA: Insufficient documentation

## 2020-04-17 DIAGNOSIS — D241 Benign neoplasm of right breast: Secondary | ICD-10-CM | POA: Diagnosis not present

## 2020-04-17 DIAGNOSIS — R928 Other abnormal and inconclusive findings on diagnostic imaging of breast: Secondary | ICD-10-CM

## 2020-04-17 DIAGNOSIS — Z171 Estrogen receptor negative status [ER-]: Secondary | ICD-10-CM | POA: Diagnosis not present

## 2020-04-17 DIAGNOSIS — C50911 Malignant neoplasm of unspecified site of right female breast: Secondary | ICD-10-CM | POA: Diagnosis not present

## 2020-04-17 DIAGNOSIS — N6311 Unspecified lump in the right breast, upper outer quadrant: Secondary | ICD-10-CM | POA: Diagnosis not present

## 2020-04-17 DIAGNOSIS — N6489 Other specified disorders of breast: Secondary | ICD-10-CM | POA: Diagnosis not present

## 2020-04-17 MED ORDER — LIDOCAINE HCL (PF) 1 % IJ SOLN
INTRAMUSCULAR | Status: AC
  Administered 2020-04-17: 10 mL
  Filled 2020-04-17: qty 10

## 2020-04-17 MED ORDER — LIDOCAINE-EPINEPHRINE (PF) 1 %-1:200000 IJ SOLN
INTRAMUSCULAR | Status: AC
  Administered 2020-04-17: 30 mL
  Filled 2020-04-17: qty 30

## 2020-04-17 NOTE — Sedation Documentation (Signed)
PT tolerated right breast biopsy well today with NAD noted. PT verbalized understanding of discharge instructions. PT ambulated back to the waiting area at this time and son is driving her home. PT to return to radiology next week for mammogram. PT given Ice packs at discharge.

## 2020-04-20 ENCOUNTER — Other Ambulatory Visit (HOSPITAL_COMMUNITY): Payer: Self-pay | Admitting: Internal Medicine

## 2020-04-20 ENCOUNTER — Ambulatory Visit (HOSPITAL_COMMUNITY)
Admission: RE | Admit: 2020-04-20 | Discharge: 2020-04-20 | Disposition: A | Discharge status: Home / Self Care | Payer: Medicare Other | Source: Ambulatory Visit | Attending: Internal Medicine | Admitting: Internal Medicine

## 2020-04-20 DIAGNOSIS — R928 Other abnormal and inconclusive findings on diagnostic imaging of breast: Secondary | ICD-10-CM | POA: Insufficient documentation

## 2020-04-20 DIAGNOSIS — N6311 Unspecified lump in the right breast, upper outer quadrant: Secondary | ICD-10-CM | POA: Diagnosis not present

## 2020-04-23 LAB — SURGICAL PATHOLOGY

## 2020-04-28 ENCOUNTER — Other Ambulatory Visit: Payer: Self-pay

## 2020-04-28 ENCOUNTER — Encounter: Payer: Self-pay | Admitting: General Surgery

## 2020-04-28 ENCOUNTER — Ambulatory Visit (INDEPENDENT_AMBULATORY_CARE_PROVIDER_SITE_OTHER): Payer: Medicare Other | Admitting: General Surgery

## 2020-04-28 VITALS — BP 127/73 | HR 77 | Temp 98.1°F | Resp 14 | Ht 64.5 in | Wt 143.0 lb

## 2020-04-28 DIAGNOSIS — C50411 Malignant neoplasm of upper-outer quadrant of right female breast: Secondary | ICD-10-CM | POA: Insufficient documentation

## 2020-04-28 DIAGNOSIS — Z171 Estrogen receptor negative status [ER-]: Secondary | ICD-10-CM

## 2020-04-28 NOTE — Progress Notes (Signed)
[facility] History and Physical  Reason for Referral: Right breast cancer  Referring Physician:  Asencion Noble, MD  Chief Complaint    New Patient (Initial Visit)      Christine Sutton is a 86 y.o. female.  HPI:  Christine Sutton is a very sweet 86 yo who has a history of Stage I ER + left breast cancer s/p total mastectomy and node in [place] in 2012. She reports not receiving any chemotherapy or hormone therapy after this and I cannot find clear documentation.  She had her screening mammogram of the left breast and was found to have areas of suspicion that were biopsied and came back Triple Negative breast cancer 1.1cm in size in the upper outer right breast.    She says that last time she opted for the mastectomy due to having multiple lesions of breast cancer and not wanting to risk having additional cancers.  She comes in today with her son, Christine Sutton.  She says that otherwise she is doing well and takes few medications. She wants to do whatever to keep the cancer from spreading but then also says she prefers no chemotherapy.  She has chronic back pain and has had back surgeries. She says that her pain has been going on for a while now. She is worried about the cancer spreading and worried about the pain.  She has not seen anyone for the back pain recently.   Past Medical History:  Diagnosis Date  . Breast disorder   . Diabetes mellitus ([place])   . High blood cholesterol level   . HX: breast cancer    left  . Hypertension   . 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 BACK SURGERY     lower  . CATARACT EXTRACTION, BILATERAL  11/2014  . MASTECTOMY Left     Family History  Problem Relation Age of Onset  . Cancer Mother   . Breast cancer Mother   . Colon cancer Mother   . Cancer Sister   . Breast cancer Sister   . Cancer Sister   . Lung cancer Father     Social History   Tobacco Use  .  Smoking status: Never Smoker  . Smokeless tobacco: Never Used  Vaping Use  . Vaping Use: Never used  Substance Use Topics  . Alcohol use: No  . Drug use: No    Medications: I have reviewed the patient's current medications. Allergies as of 04/28/2020      Reactions   Codeine Nausea And Vomiting      Medication List       Accurate as of April 28, 2020 11:59 PM. If you have any questions, ask your nurse or doctor.        acetaminophen 500 MG tablet Commonly known as: TYLENOL Take 500 mg by mouth as needed.   amLODipine 5 MG tablet Commonly known as: NORVASC Take 5 mg by mouth daily.   aspirin 81 MG chewable tablet Chew 81 mg by mouth at bedtime.   CENTRUM SILVER PO Take 1 capsule by mouth daily.   diphenhydramine-acetaminophen 25-500 MG Tabs tablet Commonly known as: TYLENOL PM Take 1 tablet by mouth at bedtime as needed.   gabapentin 300 MG capsule Commonly known as: NEURONTIN Take 300 mg by mouth 3 (three) times daily as needed.   lisinopril-hydrochlorothiazide 20-25 MG tablet Commonly known as: ZESTORETIC Take 1  tablet by mouth 2 (two) times daily.   metFORMIN 500 MG 24 hr tablet Commonly known as: GLUCOPHAGE-XR TAKE 2 TABLETS EVERY DAY   nystatin-triamcinolone ointment Commonly known as: MYCOLOG Apply 1 application topically 2 (two) times daily.   polyethylene glycol 17 g packet Commonly known as: MIRALAX / GLYCOLAX Take 17 g by mouth daily.   Thera Tabs Take by mouth.        ROS:  A comprehensive review of systems was negative except for: Cardiovascular: positive for HTN Integument/breast: positive for right breast cancer/ bruising after biopsy Musculoskeletal: positive for back pain  Blood pressure 127/73, pulse 77, temperature 98.1 F (36.7 C), temperature source Oral, resp. rate 14, height 5' 4.5" (1.638 m), weight 143 lb (64.9 kg), SpO2 95 %. Physical Exam Vitals reviewed.  Constitutional:      Appearance: She is normal weight.    HENT:     Head: Normocephalic.     Nose: Nose normal.  Eyes:     Extraocular Movements: Extraocular movements intact.  Cardiovascular:     Rate and Rhythm: Normal rate and regular rhythm.  Pulmonary:     Effort: Pulmonary effort is normal.     Breath sounds: Normal breath sounds.  Chest:     Comments: Left breast mastectomy site without mass, healed nicely; right breast with extensive hematoma and firmness from biopsy, ecchymosis evolving, steri in place laterally Abdominal:     General: There is no distension.     Palpations: Abdomen is soft.     Tenderness: There is no abdominal tenderness.  Musculoskeletal:        General: Normal range of motion.     Cervical back: Normal range of motion.  Lymphadenopathy:     Upper Body:     Right upper body: No supraclavicular or axillary adenopathy.     Left upper body: No supraclavicular or axillary adenopathy.  Skin:    General: Skin is warm.  Neurological:     General: No focal deficit present.     Mental Status: She is alert and oriented to person, place, and time.  Psychiatric:        Mood and Affect: Mood normal.        Behavior: Behavior normal.        Thought Content: Thought content normal.        Judgment: Judgment normal.     Results: CLINICAL DATA:  Screening recall for possible right breast asymmetry. History of left breast cancer postmastectomy 2012.  EXAM: DIGITAL DIAGNOSTIC UNILATERAL RIGHT MAMMOGRAM WITH TOMO AND CAD; ULTRASOUND RIGHT BREAST LIMITED  COMPARISON:  Previous exams.  ACR Breast Density Category c: The breast tissue is heterogeneously dense, which may obscure small masses.  FINDINGS: Additional tomograms were performed of the right breast. The initially questioned possible asymmetry in the central right breast is less apparent and may represent an area of dense fibroglandular tissue. There is an area of distortion with few associated calcifications which persists on the spot compression  tomograms in the upper-outer posterior right breast.  Mammographic images were processed with CAD.  Targeted ultrasound of the upper-outer right breast was performed. There is an irregular shadowing mass at the approximate 10:30 to 11 o'clock position 7 cm from nipple measuring 1.1 x 0.8 x 0.7 cm. This is felt to correspond with the distortion seen in the upper-outer right breast at mammography.  Targeted ultrasound of the central right breast was performed demonstrating several debris-filled dilated ducts. There is a mixed cystic and  solid mass at 12 o'clock retroareolar close proximity with a duct measuring 0.9 x 0.4 x 0.6 cm. This mass with multiple associated dilated ducts may account for the asymmetry recently seen on screening mammography.  No lymphadenopathy seen in the right axilla.  IMPRESSION: 1. Suspicious 1.1 cm mass in the right breast at the 10:30 to 11 o'clock position felt to correspond with an area of distortion seen mammographically.  2. Indeterminate 0.9 cm mixed cystic and solid mass in the retroareolar right breast.  RECOMMENDATION: 1. Recommend ultrasound-guided biopsy of the mass in the right breast the 10:30-11:00 Position. If the post biopsy mammogram does not show this mass to correspond to the distortion seen in the upper-outer right breast on recent mammography, then stereotactic guided biopsy should be performed.  2. Recommend ultrasound-guided biopsy of the mixed cystic and solid mass in the retroareolar right breast.  I have discussed the findings and recommendations with the patient. If applicable, a reminder letter will be sent to the patient regarding the next appointment.  BI-RADS CATEGORY  4: Suspicious.   Electronically Signed   By: Everlean Alstrom M.D.   On: 04/14/2020 14:30  ADDENDUM REPORT: 04/21/2020 13:59  ADDENDUM: Pathology revealed GRADE I INVASIVE MAMMARY CARCINOMA, FIBROCYSTIC CHANGE WITH CALCIFICATION  of the RIGHT breast. This was found to be concordant by Dr. Lovey Newcomer.  Pathology revealed FIBROADENOMA of the RIGHT breast. This was found to be concordant by Dr. Lovey Newcomer.  Pathology results were discussed with the patient by telephone. The patient reported doing well after the biopsies with tenderness at the sites. Post biopsy instructions and care were reviewed and questions were answered. The patient was encouraged to call [facility] for any additional concerns.  Per patient request, surgical referral with Dr. Curlene Labrum of [facility], will be arranged by Kathi Der RT-[facility] of [facility].  Pathology results reported by Stacie Acres RN on 04/21/2020.   Electronically Signed   By: Lovey Newcomer M.D.   On: 04/21/2020 13:59   Signed by Lovey Newcomer, MD on 04/21/2020 3:59 PM    ADDENDUM REPORT: 04/21/2020 11:09  ADDENDUM: Addendum for correction of biopsy marking clips.  Site 1: Right breast mass 11 o'clock position: Biopsy marking clip was a ribbon shaped clip.  Site 2: Right breast mass 12 o'clock position: Biopsy marking clip was a wing shaped clip.   Electronically Signed   By: Lovey Newcomer M.D.   On: 04/21/2020 11:09   Signed by Lovey Newcomer, MD on 04/21/2020 3:56 PM  Narrative & Impression  CLINICAL DATA:  Patient with indeterminate right breast mass 11 o'clock position and 12 o'clock position.  EXAM: ULTRASOUND GUIDED RIGHT BREAST CORE NEEDLE BIOPSY  COMPARISON:  Previous exam(s).  PROCEDURE: I met with the patient and we discussed the procedure of ultrasound-guided biopsy, including benefits and alternatives. We discussed the high likelihood of a successful procedure. We discussed the risks of the procedure, including infection, bleeding, tissue injury, clip migration, and inadequate sampling. Informed written consent was given. The usual time-out protocol was  performed immediately prior to the procedure.  Site 1: Right breast mass 11 o'clock position  Lesion quadrant: Upper outer quadrant  Using sterile technique and 1% Lidocaine as local anesthetic, under direct ultrasound visualization, a 14 gauge spring-loaded device was used to perform biopsy of right breast mass 11 o'clock position using a lateral approach. At the conclusion of the procedure wing tissue marker clip was deployed into the biopsy  cavity. Follow up 2 view mammogram was performed and dictated separately.  Site 2: Right breast mass 12 o'clock position  Lesion quadrant: Upper outer quadrant  Using sterile technique and 1% Lidocaine as local anesthetic, under direct ultrasound visualization, a 14 gauge spring-loaded device was used to perform biopsy of right breast mass 12 o'clock position using a lateral approach. At the conclusion of the procedure ribbon tissue marker clip was deployed into the biopsy cavity. Follow up 2 view mammogram was performed and dictated separately.  IMPRESSION: Ultrasound guided biopsy of right breast mass 11 o'clock position and right breast mass 12 o'clock position. No apparent complications.  Electronically Signed: By: Lovey Newcomer M.D. On: 04/17/2020 14:21     FINAL MICROSCOPIC DIAGNOSIS:   A. BREAST, MASS, RIGHT, BIOPSY:  - Invasive mammary carcinoma, see comment.  - Fibrocystic change with calcifications.   B. BREAST, MASS, RIGHT, BIOPSY:  - Fibroadenoma.  - No malignancy identified.   Assessment & Plan:  ASHETON VIRAMONTES is a 86 y.o. female with a new right breast cancer Triple Negative. She has extensive hematoma and firmness of the right breast and given this I am not sure a lumpectomy would have even been possible for several weeks given the distortion and firmness, but she has already decided she wants a mastectomy like the previous surgery.  We discussed that with elderly that we are foregoing radiation and  sentinel nodes in ER + and I did speak with Dr. Delton Coombes regarding her case, and given that she is Triple negative we will need to plan for sentinel node biopsy.   Discussed preop COVID testing and staying overnight.   We have discussed the options for surgery including the option of mastectomy with sentinel node biopsy versus partial mastectomy (lumpectomy) with sentinel node biopsy. We have discussed that there is no difference in the prognosis or chance or recurrence or differences in survival between the two options. We have discussed the need for radiation with the lumpectomy, and we have discussed that she will be referred to oncology after our procedure to further discuss her options for chemotherapy and hormonal therapy if she qualifies.   We have discussed that if she decides to have a lumpectomy that we will need to get a needle placed into the area where the biopsy was performed, since we cannot palpate a mass. We have also discussed the need for injection of radiotracer and blue dye to perform the sentinel node biopsy.  We have discussed that the sentinel node biopsy tells [place] if the cancer has spread to the lymph nodes, and can help with plans for chemotherapy treatment and overall prognosis.    We have discussed that if the lumpectomy does not remove the entire cancer that she may have to have an additional procedure, and we have discussed that a positive sentinel node can require further removal of lymph nodes from the axilla but that recent research does not show any improvement in disease free survival and carries greater risk for lymphedema.    We have discussed that these are big discussions, and that the risk from the operations are similar including risk of bleeding, risk of infection, and risk of needing additional surgeries. We have discussed the likely need for an overnight stay with a mastectomy and a drain that will remain in place for about 1 week.     All questions were  answered to the satisfaction of the patient and family.  Christine Sutton 04/30/2020, 10:43 AM

## 2020-04-28 NOTE — Patient Instructions (Signed)
Will discuss with Dr. Delton Coombes if we need to sample lymph nodes.  Total or Modified Radical Mastectomy A total mastectomy and a modified radical mastectomy are surgeries that are done as part of treatment for breast cancer. You will have one of those types of surgery. Both types involve removing a breast.  In a total mastectomy (simple mastectomy), all breast tissue including the nipple will be removed.  In a modified radical mastectomy, lymph nodes under the arm will be removed along with the breast and nipple. Some of the lining over the muscle tissues under the breast may also be removed. These procedures may also be used to help prevent breast cancer. A preventive (prophylactic) mastectomy may be done if you are at an increased risk of breast cancer due to harmful changes (mutations) in certain genes (BRCA genes). In that case, the procedure involves removing both of your breasts. This can reduce your risk of developing breast cancer in the future. For a transgender person, a total mastectomy may be done as part of a surgical transition from female to female. Let your health care provider know about:  Any allergies you have.  All medicines you are taking, including vitamins, herbs, eye drops, creams, and over-the-counter medicines.  Any problems you or family members have had with anesthetic medicines.  Any blood disorders you have.  Any surgeries you have had.  Any medical conditions you have.  Whether you are pregnant or may be pregnant. What are the risks? Generally, this is a safe procedure. However, problems may occur, including:  Pain.  Infection.  Bleeding.  Allergic reactions to medicines.  Scar tissue.  Chest numbness on the side of the surgery.  Fluid buildup under the skin flaps where your breast was removed (seroma).  Sensation of throbbing or tingling.  Stress or sadness from losing your breast. If you have the lymph nodes under your arm removed, you may  have arm swelling, weakness, or numbness on the same side of your body as your surgery. Medicines  Ask your health care provider about: ? Changing or stopping your regular medicines. This is especially important if you are taking diabetes medicines or blood thinners. ? Taking medicines such as aspirin and ibuprofen. These medicines can thin your blood. Do not take these medicines unless your health care provider tells you to take them. ? Taking over-the-counter medicines, vitamins, herbs, and supplements.  Your health care team may give you antibiotic medicine to help prevent infection. General instructions  You may be checked for extra fluid around your lymph nodes (lymphedema).  Plan to have someone take you home from the hospital or clinic.  Plan to have a responsible adult care for you for at least 24 hours after you leave the hospital or clinic. This is important.  Ask your health care provider how your surgical site will be marked or identified.  You may be asked to shower with a germ-killing soap. What happens during the procedure?   To lower your risk of infection: ? Your health care team will wash or sanitize their hands. ? Your skin will be washed with soap.  An IV will be inserted into one of your veins.  You will be given a medicine to make you fall asleep (general anesthetic).  A wide incision will be made around your nipple. The skin and nipple inside the incision will be removed along with all breast tissue.  If you are having a modified radical mastectomy: ? The lining over your chest  muscles will be removed. ? The incision may be extended to reach the lymph nodes under your arm, or a second incision may be made. ? Lymph nodes will be removed.  Breast tissue and lymph nodes that are removed will be sent to the lab for testing.  You may have a drainage tube inserted into your incision to collect fluid that builds up after surgery. This tube will be connected to  a suction bulb on the outside of your body to remove the fluid.  Your incision or incisions will be closed with stitches (sutures).  A bandage (dressing) will be placed over your breast area. If lymph nodes were removed, a dressing will also be placed under your arm. The procedure may vary among health care providers and hospitals. What happens after the procedure?  Your blood pressure, heart rate, breathing rate, and blood oxygen level will be monitored until the medicines you were given have worn off.  You will be given pain medicine as needed.  You will be encouraged to get up and walk as soon as you can.  Your IV can be removed when you are able to eat and drink.  You may have a drainage tube in place for 2-3 days to prevent a collection of blood (hematoma) from developing in the breast area. You will be given instructions about caring for the drain before you go home.  A pressure bandage may be applied for 1-2 days to prevent bleeding or swelling. Ask your health care provider how to care for your pressure bandage at home. Summary  In a total mastectomy (simple mastectomy), all breast tissue including the nipple will be removed. In a modified radical mastectomy, the lymph nodes under the arm will be removed along with the breast and nipple.  Before the procedure, follow instructions from your health care provider about eating and drinking, and ask about changing or stopping your regular medicines.  You will be given a medicine to make you fall asleep (general anesthetic) during the procedure. This information is not intended to replace advice given to you by your health care provider. Make sure you discuss any questions you have with your health care provider. Document Revised: 08/03/2018 Document Reviewed: 03/03/2017 Elsevier Patient Education  2020 [employer].   Sentinel Lymph Node Biopsy  A sentinel lymph node biopsy is a procedure to identify, remove, and examine one or  more lymph nodes for cancer. Lymph is fluid from the tissues in your body. It is removed through the lymphatic system. This system is part of your body's defense system (immune system) and includes lymph nodes and lymph vessels. Certain types of cancer can spread to nearby lymph nodes. The cancer usually spreads to one lymph node first, and then to others. The first lymph node that the cancer could spread to is called the sentinel lymph node. In some cases, there may be more than one sentinel lymph node. You may have this procedure to determine whether your cancer has spread and to help your health care provider plan your treatment. If no cancer is found in the sentinel lymph node, it is very unlikely that the cancer has spread to any of your other lymph nodes in the body. If cancer is found in the sentinel lymph node, your surgeon may remove additional lymph nodes for examination. Tell a health care provider about:  Any allergies you have, including any history of problems with contrast dye.  All medicines you are taking, including vitamins, herbs, eye drops,  creams, and over-the-counter medicines.  Any problems you or family members have had with anesthetic medicines.  Any blood disorders you have.  Any surgeries you have had.  Any medical conditions you have.  Whether you are pregnant or may be pregnant. What are the risks? Generally, this is a safe procedure. However, problems may occur, including:  Infection.  Bleeding.  Allergic reaction to medicines or dyes.  Staining of the skin where the dye is injected.  Damaged lymph vessels, causing a buildup of fluid (lymphedema).  Pain or bruising at the biopsy site. Medicines Ask your health care provider about:  Changing or stopping your regular medicines. This is especially important if you are taking diabetes medicines or blood thinners.  Taking medicines such as aspirin and ibuprofen. These medicines can thin your blood. Do not  take these medicines unless your health care provider tells you to take them.  Taking over-the-counter medicines, vitamins, herbs, and supplements. General instructions  Do not use any products that contain nicotine or tobacco, such as cigarettes and e-cigarettes. Stop any use of these products at least 2 weeks before the procedure. If you need help quitting, ask your health care provider.  You may have blood tests to make sure your blood clots normally.  Plan to have someone take you home from the hospital or clinic.  Ask your health care provider what steps will be taken to prevent infection. These may include: ? Removing hair at the surgery site. ? Washing skin with soap. ? Taking antibiotic medicine. What happens during the procedure?   An IV will be inserted into one of your veins.  You will be given one or more of the following: ? A medicine to help you relax (sedative). ? A medicine to numb the area (local anesthetic). ? A medicine to make you fall asleep (general anesthetic).  Blue dye, a radioactive substance, or both will be injected around the tumor. ? The blue dye will reach your lymph node quickly. It may be given just before surgery. ? The radioactive substance will take longer to reach your lymph nodes. It may be given 2-24 hours before surgery, depending on your hospital.  Both the dye and the radioactive substance will follow the same path that a spreading cancer would likely follow.  If a radioactive substance was injected, a scanner will show where the substance has spread. This will help identify the sentinel lymph node.  The surgeon will make a small incision. If blue dye was injected, your surgeon will look for any lymph nodes that have picked up the dye.  Sentinel lymph nodes will be removed and sent to a lab for testing. ? If no cancer is found, no other lymph nodes will be removed. It is unlikely the cancer has spread. ? If cancer is found, the surgeon  will remove other lymph nodes for testing. This may happen during the same procedure or at a later time.  The incision will be closed with stitches (sutures) or skin glue.  Small adhesive bandages may be used to keep the skin edges close together.  A small dressing may be taped over the incision area. The procedure may vary among health care providers and hospitals. What happens after the procedure?  Your blood pressure, heart rate, breathing rate, and blood oxygen level will be monitored until you leave the hospital or clinic.  Your urine or stool may be blue for the next 24-48 hours. This is normal. It is caused by the dye  that is used during the procedure.  It is up to you to get the results of your procedure. Ask your health care provider, or the department that is doing the procedure, when your results will be ready. Summary  A sentinel lymph node biopsy is a procedure to identify, remove, and examine one or more lymph nodes for cancer.  If you have cancer, you may have this procedure to determine whether your cancer has spread.  If no cancer is found in the sentinel lymph node, it is very unlikely that the cancer has spread to any other lymph nodes. If cancer is found in the sentinel lymph node, your surgeon may remove additional lymph nodes for examination. This information is not intended to replace advice given to you by your health care provider. Make sure you discuss any questions you have with your health care provider. Document Revised: 06/28/2017 Document Reviewed: 06/12/2017 Elsevier Patient Education  2020 [employer].

## 2020-04-30 ENCOUNTER — Other Ambulatory Visit (HOSPITAL_COMMUNITY): Payer: Self-pay | Admitting: General Surgery

## 2020-04-30 ENCOUNTER — Encounter: Payer: Self-pay | Admitting: General Surgery

## 2020-04-30 DIAGNOSIS — Z171 Estrogen receptor negative status [ER-]: Secondary | ICD-10-CM

## 2020-04-30 DIAGNOSIS — C50411 Malignant neoplasm of upper-outer quadrant of right female breast: Secondary | ICD-10-CM

## 2020-05-01 NOTE — H&P (Signed)
[facility] History and Physical  Reason for Referral: Right breast cancer  Referring Physician:  Asencion Noble, MD     Chief Complaint    New Patient (Initial Visit)      Christine Sutton is a 86 y.o. female.  HPI:  Ms. Batres is a very sweet 86 yo who has a history of Stage I ER + left breast cancer s/p total mastectomy and node in [place] in 2012. She reports not receiving any chemotherapy or hormone therapy after this and I cannot find clear documentation.  She had her screening mammogram of the left breast and was found to have areas of suspicion that were biopsied and came back Triple Negative breast cancer 1.1cm in size in the upper outer right breast.    She says that last time she opted for the mastectomy due to having multiple lesions of breast cancer and not wanting to risk having additional cancers.  She comes in today with her son, Balinda Quails.  She says that otherwise she is doing well and takes few medications. She wants to do whatever to keep the cancer from spreading but then also says she prefers no chemotherapy.  She has chronic back pain and has had back surgeries. She says that her pain has been going on for a while now. She is worried about the cancer spreading and worried about the pain.  She has not seen anyone for the back pain recently.       Past Medical History:  Diagnosis Date  . Breast disorder   . Diabetes mellitus ([place])   . High blood cholesterol level   . HX: breast cancer    left  . Hypertension   . 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 BACK SURGERY     lower  . CATARACT EXTRACTION, BILATERAL  11/2014  . MASTECTOMY Left          Family History  Problem Relation Age of Onset  . Cancer Mother   . Breast cancer Mother   . Colon cancer Mother   . Cancer Sister   . Breast cancer Sister   . Cancer Sister   . Lung cancer  Father     Social History   Tobacco Use  . Smoking status: Never Smoker  . Smokeless tobacco: Never Used  Vaping Use  . Vaping Use: Never used  Substance Use Topics  . Alcohol use: No  . Drug use: No    Medications: I have reviewed the patient's current medications.      Allergies as of 04/28/2020      Reactions   Codeine Nausea And Vomiting         Medication List       Accurate as of April 28, 2020 11:59 PM. If you have any questions, ask your nurse or doctor.        acetaminophen 500 MG tablet Commonly known as: TYLENOL Take 500 mg by mouth as needed.   amLODipine 5 MG tablet Commonly known as: NORVASC Take 5 mg by mouth daily.   aspirin 81 MG chewable tablet Chew 81 mg by mouth at bedtime.   CENTRUM SILVER PO Take 1 capsule by mouth daily.   diphenhydramine-acetaminophen 25-500 MG Tabs tablet Commonly known as: TYLENOL PM Take 1 tablet by mouth at bedtime as needed.   gabapentin 300 MG capsule Commonly known  as: NEURONTIN Take 300 mg by mouth 3 (three) times daily as needed.   lisinopril-hydrochlorothiazide 20-25 MG tablet Commonly known as: ZESTORETIC Take 1 tablet by mouth 2 (two) times daily.   metFORMIN 500 MG 24 hr tablet Commonly known as: GLUCOPHAGE-XR TAKE 2 TABLETS EVERY DAY   nystatin-triamcinolone ointment Commonly known as: MYCOLOG Apply 1 application topically 2 (two) times daily.   polyethylene glycol 17 g packet Commonly known as: MIRALAX / GLYCOLAX Take 17 g by mouth daily.   Thera Tabs Take by mouth.        ROS:  A comprehensive review of systems was negative except for: Cardiovascular: positive for HTN Integument/breast: positive for right breast cancer/ bruising after biopsy Musculoskeletal: positive for back pain  Blood pressure 127/73, pulse 77, temperature 98.1 F (36.7 C), temperature source Oral, resp. rate 14, height 5' 4.5" (1.638 m), weight 143 lb (64.9 kg), SpO2 95  %. Physical Exam Vitals reviewed.  Constitutional:      Appearance: She is normal weight.  HENT:     Head: Normocephalic.     Nose: Nose normal.  Eyes:     Extraocular Movements: Extraocular movements intact.  Cardiovascular:     Rate and Rhythm: Normal rate and regular rhythm.  Pulmonary:     Effort: Pulmonary effort is normal.     Breath sounds: Normal breath sounds.  Chest:     Comments: Left breast mastectomy site without mass, healed nicely; right breast with extensive hematoma and firmness from biopsy, ecchymosis evolving, steri in place laterally Abdominal:     General: There is no distension.     Palpations: Abdomen is soft.     Tenderness: There is no abdominal tenderness.  Musculoskeletal:        General: Normal range of motion.     Cervical back: Normal range of motion.  Lymphadenopathy:     Upper Body:     Right upper body: No supraclavicular or axillary adenopathy.     Left upper body: No supraclavicular or axillary adenopathy.  Skin:    General: Skin is warm.  Neurological:     General: No focal deficit present.     Mental Status: She is alert and oriented to person, place, and time.  Psychiatric:        Mood and Affect: Mood normal.        Behavior: Behavior normal.        Thought Content: Thought content normal.        Judgment: Judgment normal.     Results: CLINICAL DATA: Screening recall for possible right breast asymmetry. History of left breast cancer postmastectomy 2012.  EXAM: DIGITAL DIAGNOSTIC UNILATERAL RIGHT MAMMOGRAM WITH TOMO AND CAD; ULTRASOUND RIGHT BREAST LIMITED  COMPARISON: Previous exams.  ACR Breast Density Category c: The breast tissue is heterogeneously dense, which may obscure small masses.  FINDINGS: Additional tomograms were performed of the right breast. The initially questioned possible asymmetry in the central right breast is less apparent and may represent an area of dense fibroglandular tissue. There is an  area of distortion with few associated calcifications which persists on the spot compression tomograms in the upper-outer posterior right breast.  Mammographic images were processed with CAD.  Targeted ultrasound of the upper-outer right breast was performed. There is an irregular shadowing mass at the approximate 10:30 to 11 o'clock position 7 cm from nipple measuring 1.1 x 0.8 x 0.7 cm. This is felt to correspond with the distortion seen in the upper-outer right breast at  mammography.  Targeted ultrasound of the central right breast was performed demonstrating several debris-filled dilated ducts. There is a mixed cystic and solid mass at 12 o'clock retroareolar close proximity with a duct measuring 0.9 x 0.4 x 0.6 cm. This mass with multiple associated dilated ducts may account for the asymmetry recently seen on screening mammography.  No lymphadenopathy seen in the right axilla.  IMPRESSION: 1. Suspicious 1.1 cm mass in the right breast at the 10:30 to 11 o'clock position felt to correspond with an area of distortion seen mammographically.  2. Indeterminate 0.9 cm mixed cystic and solid mass in the retroareolar right breast.  RECOMMENDATION: 1. Recommend ultrasound-guided biopsy of the mass in the right breast the 10:30-11:00 Position. If the post biopsy mammogram does not show this mass to correspond to the distortion seen in the upper-outer right breast on recent mammography, then stereotactic guided biopsy should be performed.  2. Recommend ultrasound-guided biopsy of the mixed cystic and solid mass in the retroareolar right breast.  I have discussed the findings and recommendations with the patient. If applicable, a reminder letter will be sent to the patient regarding the next appointment.  BI-RADS CATEGORY 4: Suspicious.   Electronically Signed By: Everlean Alstrom M.D. On: 04/14/2020 14:30  ADDENDUM REPORT: 04/21/2020  13:59  ADDENDUM: Pathology revealed GRADE I INVASIVE MAMMARY CARCINOMA, FIBROCYSTIC CHANGE WITH CALCIFICATION of the RIGHT breast. This was found to be concordant by Dr. Lovey Newcomer.  Pathology revealed FIBROADENOMA of the RIGHT breast. This was found to be concordant by Dr. Lovey Newcomer.  Pathology results were discussed with the patient by telephone. The patient reported doing well after the biopsies with tenderness at the sites. Post biopsy instructions and care were reviewed and questions were answered. The patient was encouraged to call [facility] for any additional concerns.  Per patient request, surgical referral with Dr. Curlene Labrum of [facility], will be arranged by Kathi Der RT-CT of [facility].  Pathology results reported by Stacie Acres RN on 04/21/2020.   Electronically Signed By: Lovey Newcomer M.D. On: 04/21/2020 13:59   Signed by Lovey Newcomer, MD on 04/21/2020 3:59 PM    ADDENDUM REPORT: 04/21/2020 11:09  ADDENDUM: Addendum for correction of biopsy marking clips.  Site 1: Right breast mass 11 o'clock position: Biopsy marking clip was a ribbon shaped clip.  Site 2: Right breast mass 12 o'clock position: Biopsy marking clip was a wing shaped clip.   Electronically Signed By: Lovey Newcomer M.D. On: 04/21/2020 11:09   Signed by Lovey Newcomer, MD on 04/21/2020 3:56 PM  Narrative & Impression  CLINICAL DATA: Patient with indeterminate right breast mass 11 o'clock position and 12 o'clock position.  EXAM: ULTRASOUND GUIDED RIGHT BREAST CORE NEEDLE BIOPSY  COMPARISON: Previous exam(s).  PROCEDURE: I met with the patient and we discussed the procedure of ultrasound-guided biopsy, including benefits and alternatives. We discussed the high likelihood of a successful procedure. We discussed the risks of the procedure, including infection, bleeding, tissue injury, clip  migration, and inadequate sampling. Informed written consent was given. The usual time-out protocol was performed immediately prior to the procedure.  Site 1: Right breast mass 11 o'clock position  Lesion quadrant: Upper outer quadrant  Using sterile technique and 1% Lidocaine as local anesthetic, under direct ultrasound visualization, a 14 gauge spring-loaded device was used to perform biopsy of right breast mass 11 o'clock position using a lateral approach. At the conclusion of the procedure wing tissue marker clip was  deployed into the biopsy cavity. Follow up 2 view mammogram was performed and dictated separately.  Site 2: Right breast mass 12 o'clock position  Lesion quadrant: Upper outer quadrant  Using sterile technique and 1% Lidocaine as local anesthetic, under direct ultrasound visualization, a 14 gauge spring-loaded device was used to perform biopsy of right breast mass 12 o'clock position using a lateral approach. At the conclusion of the procedure ribbon tissue marker clip was deployed into the biopsy cavity. Follow up 2 view mammogram was performed and dictated separately.  IMPRESSION: Ultrasound guided biopsy of right breast mass 11 o'clock position and right breast mass 12 o'clock position. No apparent complications.  Electronically Signed: By: Lovey Newcomer M.D. On: 04/17/2020 14:21     FINAL MICROSCOPIC DIAGNOSIS:   A. BREAST, MASS, RIGHT, BIOPSY:  - Invasive mammary carcinoma, see comment.  - Fibrocystic change with calcifications.   B. BREAST, MASS, RIGHT, BIOPSY:  - Fibroadenoma.  - No malignancy identified.   Assessment & Plan:  NIVEDITA MIRABELLA is a 86 y.o. female with a new right breast cancer Triple Negative. She has extensive hematoma and firmness of the right breast and given this I am not sure a lumpectomy would have even been possible for several weeks given the distortion and firmness, but she has already decided she wants a  mastectomy like the previous surgery.  We discussed that with elderly that we are foregoing radiation and sentinel nodes in ER + and I did speak with Dr. Delton Coombes regarding her case, and given that she is Triple negative we will need to plan for sentinel node biopsy.   Discussed preop COVID testing and staying overnight.   We have discussed the options for surgery including the option of mastectomy with sentinel node biopsy versus partial mastectomy (lumpectomy) with sentinel node biopsy. We have discussed that there is no difference in the prognosis or chance or recurrence or differences in survival between the two options. We have discussed the need for radiation with the lumpectomy, and we have discussed that she will be referred to oncology after our procedure to further discuss her options for chemotherapy and hormonal therapy if she qualifies.   We have discussed that if she decides to have a lumpectomy that we will need to get a needle placed into the area where the biopsy was performed, since we cannot palpate a mass. We have also discussed the need for injection of radiotracer and blue dye to perform the sentinel node biopsy.  We have discussed that the sentinel node biopsy tells [place] if the cancer has spread to the lymph nodes, and can help with plans for chemotherapy treatment and overall prognosis.    We have discussed that if the lumpectomy does not remove the entire cancer that she may have to have an additional procedure, and we have discussed that a positive sentinel node can require further removal of lymph nodes from the axilla but that recent research does not show any improvement in disease free survival and carries greater risk for lymphedema.    We have discussed that these are big discussions, and that the risk from the operations are similar including risk of bleeding, risk of infection, and risk of needing additional surgeries. We have discussed the likely need for an  overnight stay with a mastectomy and a drain that will remain in place for about 1 week.     All questions were answered to the satisfaction of the patient and family.  Virl Cagey 04/30/2020,  10:43 AM

## 2020-05-11 NOTE — Patient Instructions (Signed)
Christine Sutton  05/11/2020     @PREFPERIOPPHARMACY @   Your procedure is scheduled on  05/15/2020.  Report to [facility] at  0700  A.M.  Call this number if you have problems the morning of surgery:  [phone]   Remember:  Do not eat or drink after midnight.                         Take these medicines the morning of surgery with A SIP OF WATER  gabapentin    Do not wear jewelry, make-up or nail polish.  Do not wear lotions, powders, or perfumes, or deodorant. Please brush your teeth.  Do not shave 48 hours prior to surgery.  Men may shave face and neck.  Do not bring valuables to the hospital.  [facility] is not responsible for any belongings or valuables.  Contacts, dentures or bridgework may not be worn into surgery.  Leave your suitcase in the car.  After surgery it may be brought to your room.  For patients admitted to the hospital, discharge time will be determined by your treatment team.  Patients discharged the day of surgery will not be allowed to drive home.   Name and phone number of your driver:   family    Special instructions:  DO NOT smoke the morning of your procedure.  Please read over the following fact sheets that you were given. Anesthesia Post-op Instructions and Care and Recovery After Surgery       Total or Modified Radical Mastectomy, Care After This sheet gives you information about how to care for yourself after your procedure. Your health care provider may also give you more specific instructions. If you have problems or questions, contact your health care provider. What can I expect after the procedure? After the procedure, it is common to have:  Pain.  Numbness.  Stiffness in the arm or shoulder.  Feelings of stress, sadness, or depression. If the lymph nodes under your arm were removed, you may have arm swelling, weakness, or numbness on the same side of your body as your surgery. Follow these instructions at  home: Incision care   Follow instructions from your health care provider about how to take care of your incision. Make sure you: ? Wash your hands with soap and water before you change your bandage (dressing). If soap and water are not available, use hand sanitizer. ? Change your dressing as told by your health care provider. ? Leave stitches (sutures), skin glue, or adhesive strips in place. These skin closures may need to stay in place for 2 weeks or longer. If adhesive strip edges start to loosen and curl up, you may trim the loose edges. Do not remove adhesive strips completely unless your health care provider tells you to do that.  Check your incision area every day for signs of infection. Check for: ? Redness, swelling, or more pain. ? Fluid or blood. ? Warmth. ? Pus or a bad smell.  If you were sent home with a surgical drain in place, follow instructions from your health care provider about emptying it. Bathing  Do not take baths, swim, or use a hot tub until your health care provider approves. Ask your health care provider if you may take showers. You may only be allowed to take sponge baths. Activity  Return to your normal activities as told by your health care provider.  Ask your health care provider what activities are safe for you.  Avoid activities that take a lot of effort.  Be careful to avoid any activities that could cause an injury to your arm on the side of your surgery.  Do not lift anything that is heavier than 10 lb (4.5 kg), or the limit that you are told, until your health care provider says that it is safe.  Avoid lifting with the arm on the side of your surgery.  Do not carry heavy objects on your shoulder.  After your drain is removed, do exercises to prevent stiffness and swelling in your arm. Talk with your health care provider about which exercises are safe for you. General instructions  Take over-the-counter and prescription medicines only as told by  your health care provider.  You may eat what you usually do.  Keep your arm raised (elevated) above the level of your heart when you are sitting or lying down.  Do not wear tight jewelry on your arm, wrist, or fingers on the side of your surgery.  You may be given a tight sleeve (compression bandage) to wear over your arm on the side of your surgery. Wear this sleeve as told by your health care provider.  Ask your health care provider when you can start wearing a bra or using a breast prosthesis.  Before you are involved in certain procedures such as giving blood or having your blood pressure checked, tell all your health care providers if lymph nodes under your arm were removed. This is important information. Follow-up  Keep all follow-up visits as told by your health care provider. This is important.  Get checked for extra fluid around your lymph nodes (lymphedema) as often as told by your health care provider. Contact a health care provider if:  You have a fever.  Your pain medicine is not working.  Your arm swelling, weakness, or numbness has not improved after a few weeks.  You have new swelling in your breast area or arm.  You have redness, swelling, or more pain in your incision area.  You have fluid or blood coming from your incision.  Your incision feels warm to the touch.  You have pus or a bad smell coming from your incision. Get help right away if:  You have very bad pain in your breast area or arm.  You have chest pain.  You have difficulty breathing. Summary  Follow instructions from your health care provider about how to take care of your incision. Check your incision area every day for signs of infection.  Ask your health care provider what activities are safe for you.  Keep all follow-up visits as told by your health care provider. This is important.  Make sure you know which symptoms should cause you to contact your health care provider or to get  help right away. This information is not intended to replace advice given to you by your health care provider. Make sure you discuss any questions you have with your health care provider. Document Revised: 08/03/2018 Document Reviewed: 03/03/2017 Elsevier Patient Education  [place] Lymph Node Biopsy, Care After This sheet gives you information about how to care for yourself after your procedure. Your health care provider may also give you more specific instructions. If you have problems or questions, contact your health care provider. What can I expect after the procedure? After the procedure, it is common to have:  Blue urine or stool for the  next 24-48 hours. This is normal. It is caused by the dye used during the procedure.  Blue skin at the injection site. This may last for up to 8 weeks.  Numbness, tingling, or pain near your incision site.  Swelling or bruising near your incision. Follow these instructions at home: Incision care      Follow instructions from your health care provider about how to take care of your incision. Make sure you: ? Wash your hands with soap and water before you change your bandage (dressing). If soap and water are not available, use hand sanitizer. ? Change your dressing as told by your health care provider. ? Leave stitches (sutures), skin glue, or adhesive strips in place. These skin closures may need to stay in place for 2 weeks or longer. If adhesive strip edges start to loosen and curl up, you may trim the loose edges. Do not remove adhesive strips completely unless your health care provider tells you to do that.  Check your incision area every day for signs of infection. Check for: ? Redness, swelling, or pain. ? Fluid or blood. ? Warmth. ? Pus or a bad smell.  Do not take baths, swim, or use a hot tub until your health care provider approves. Ask your health care provider if you can take showers. You may be able to shower  24 hours after your procedure. After a shower, pat the incision area dry with a clean towel. Do not rub the incision. That could cause bleeding. Activity  Avoid activities that take a lot of effort.  Return to your normal activities as told by your health care provider. Ask your health care provider what activities are safe for you. General instructions  Take over-the-counter and prescription medicines only as told by your health care provider.  You may resume your regular diet.  If the procedure was done on or near the lymph nodes under your arm (axillary lymph nodes), do not have your blood pressure taken or have blood drawn from the arm on the side of the biopsy until your health care provider says it is okay.  You may need to be screened for extra fluid around the lymph nodes (lymphedema). Follow instructions from your health care provider about how often you should be checked.  Keep all follow-up visits as told by your health care provider. This is important. Contact a health care provider if:  Your pain medicine is not helping.  You have more redness, swelling, or pain around your biopsy site.  You have more fluid or blood coming from your incision.  Your incision feels warm to the touch.  You have pus or a bad smell coming from your incision.  You have nausea and vomiting.  You have any new bruising.  You have chills or a fever. Get help right away if:  You have pain that is getting worse, and your medicine is not helping.  You have vomiting that will not stop.  You have chest pain or trouble breathing. Summary  After the procedure, it is common to have blue urine or stool for the next 24-48 hours.  Follow instructions from your health care provider about how to take care of your incision. Check your incision area every day for signs of infection.  Take over-the-counter and prescription medicines only as told by your health care provider.  Ask your health care  provider when you can return to your normal activities. This information is not intended to replace advice given  to you by your health care provider. Make sure you discuss any questions you have with your health care provider. Document Revised: 06/16/2017 Document Reviewed: 06/12/2017 Elsevier Patient Education  2020 [place] Anesthesia, Adult, Care After This sheet gives you information about how to care for yourself after your procedure. Your health care provider may also give you more specific instructions. If you have problems or questions, contact your health care provider. What can I expect after the procedure? After the procedure, the following side effects are common:  Pain or discomfort at the IV site.  Nausea.  Vomiting.  Sore throat.  Trouble concentrating.  Feeling cold or chills.  Weak or tired.  Sleepiness and fatigue.  Soreness and body aches. These side effects can affect parts of the body that were not involved in surgery. Follow these instructions at home:  For at least 24 hours after the procedure:  Have a responsible adult stay with you. It is important to have someone help care for you until you are awake and alert.  Rest as needed.  Do not: ? Participate in activities in which you could fall or become injured. ? Drive. ? Use heavy machinery. ? Drink alcohol. ? Take sleeping pills or medicines that cause drowsiness. ? Make important decisions or sign legal documents. ? Take care of children on your own. Eating and drinking  Follow any instructions from your health care provider about eating or drinking restrictions.  When you feel hungry, start by eating small amounts of foods that are soft and easy to digest (bland), such as toast. Gradually return to your regular diet.  Drink enough fluid to keep your urine pale yellow.  If you vomit, rehydrate by drinking water, juice, or clear broth. General instructions  If you have sleep  apnea, surgery and certain medicines can increase your risk for breathing problems. Follow instructions from your health care provider about wearing your sleep device: ? Anytime you are sleeping, including during daytime naps. ? While taking prescription pain medicines, sleeping medicines, or medicines that make you drowsy.  Return to your normal activities as told by your health care provider. Ask your health care provider what activities are safe for you.  Take over-the-counter and prescription medicines only as told by your health care provider.  If you smoke, do not smoke without supervision.  Keep all follow-up visits as told by your health care provider. This is important. Contact a health care provider if:  You have nausea or vomiting that does not get better with medicine.  You cannot eat or drink without vomiting.  You have pain that does not get better with medicine.  You are unable to pass urine.  You develop a skin rash.  You have a fever.  You have redness around your IV site that gets worse. Get help right away if:  You have difficulty breathing.  You have chest pain.  You have blood in your urine or stool, or you vomit blood. Summary  After the procedure, it is common to have a sore throat or nausea. It is also common to feel tired.  Have a responsible adult stay with you for the first 24 hours after general anesthesia. It is important to have someone help care for you until you are awake and alert.  When you feel hungry, start by eating small amounts of foods that are soft and easy to digest (bland), such as toast. Gradually return to your regular diet.  Drink enough fluid  to keep your urine pale yellow.  Return to your normal activities as told by your health care provider. Ask your health care provider what activities are safe for you. This information is not intended to replace advice given to you by your health care provider. Make sure you discuss any  questions you have with your health care provider. Document Revised: 06/02/2017 Document Reviewed: 01/13/2017 Elsevier Patient Education  [place]. How to Use Chlorhexidine for Bathing Chlorhexidine gluconate (CHG) is a germ-killing (antiseptic) solution that is used to clean the skin. It can get rid of the bacteria that normally live on the skin and can keep them away for about 24 hours. To clean your skin with CHG, you may be given:  A CHG solution to use in the shower or as part of a sponge bath.  A prepackaged cloth that contains CHG. Cleaning your skin with CHG may help lower the risk for infection:  While you are staying in the intensive care unit of the hospital.  If you have a vascular access, such as a central line, to provide short-term or long-term access to your veins.  If you have a catheter to drain urine from your bladder.  If you are on a ventilator. A ventilator is a machine that helps you breathe by moving air in and out of your lungs.  After surgery. What are the risks? Risks of using CHG include:  A skin reaction.  Hearing loss, if CHG gets in your ears.  Eye injury, if CHG gets in your eyes and is not rinsed out.  The CHG product catching fire. Make sure that you avoid smoking and flames after applying CHG to your skin. Do not use CHG:  If you have a chlorhexidine allergy or have previously reacted to chlorhexidine.  On babies younger than 69 months of age. How to use CHG solution  Use CHG only as told by your health care provider, and follow the instructions on the label.  Use the full amount of CHG as directed. Usually, this is one bottle. During a shower Follow these steps when using CHG solution during a shower (unless your health care provider gives you different instructions): 1. Start the shower. 2. Use your normal soap and shampoo to wash your face and hair. 3. Turn off the shower or move out of the shower stream. 4. Pour the CHG  onto a clean washcloth. Do not use any type of brush or rough-edged sponge. 5. Starting at your neck, lather your body down to your toes. Make sure you follow these instructions: ? If you will be having surgery, pay special attention to the part of your body where you will be having surgery. Scrub this area for at least 1 minute. ? Do not use CHG on your head or face. If the solution gets into your ears or eyes, rinse them well with water. ? Avoid your genital area. ? Avoid any areas of skin that have broken skin, cuts, or scrapes. ? Scrub your back and under your arms. Make sure to wash skin folds. 6. Let the lather sit on your skin for 1-2 minutes or as long as told by your health care provider. 7. Thoroughly rinse your entire body in the shower. Make sure that all body creases and crevices are rinsed well. 8. Dry off with a clean towel. Do not put any substances on your body afterward--such as powder, lotion, or perfume--unless you are told to do so by your health care  provider. Only use lotions that are recommended by the manufacturer. 9. Put on clean clothes or pajamas. 10. If it is the night before your surgery, sleep in clean sheets.  During a sponge bath Follow these steps when using CHG solution during a sponge bath (unless your health care provider gives you different instructions): 1. Use your normal soap and shampoo to wash your face and hair. 2. Pour the CHG onto a clean washcloth. 3. Starting at your neck, lather your body down to your toes. Make sure you follow these instructions: ? If you will be having surgery, pay special attention to the part of your body where you will be having surgery. Scrub this area for at least 1 minute. ? Do not use CHG on your head or face. If the solution gets into your ears or eyes, rinse them well with water. ? Avoid your genital area. ? Avoid any areas of skin that have broken skin, cuts, or scrapes. ? Scrub your back and under your arms. Make  sure to wash skin folds. 4. Let the lather sit on your skin for 1-2 minutes or as long as told by your health care provider. 5. Using a different clean, wet washcloth, thoroughly rinse your entire body. Make sure that all body creases and crevices are rinsed well. 6. Dry off with a clean towel. Do not put any substances on your body afterward--such as powder, lotion, or perfume--unless you are told to do so by your health care provider. Only use lotions that are recommended by the manufacturer. 7. Put on clean clothes or pajamas. 8. If it is the night before your surgery, sleep in clean sheets. How to use CHG prepackaged cloths  Only use CHG cloths as told by your health care provider, and follow the instructions on the label.  Use the CHG cloth on clean, dry skin.  Do not use the CHG cloth on your head or face unless your health care provider tells you to.  When washing with the CHG cloth: ? Avoid your genital area. ? Avoid any areas of skin that have broken skin, cuts, or scrapes. Before surgery Follow these steps when using a CHG cloth to clean before surgery (unless your health care provider gives you different instructions): 1. Using the CHG cloth, vigorously scrub the part of your body where you will be having surgery. Scrub using a back-and-forth motion for 3 minutes. The area on your body should be completely wet with CHG when you are done scrubbing. 2. Do not rinse. Discard the cloth and let the area air-dry. Do not put any substances on the area afterward, such as powder, lotion, or perfume. 3. Put on clean clothes or pajamas. 4. If it is the night before your surgery, sleep in clean sheets.  For general bathing Follow these steps when using CHG cloths for general bathing (unless your health care provider gives you different instructions). 1. Use a separate CHG cloth for each area of your body. Make sure you wash between any folds of skin and between your fingers and toes. Wash  your body in the following order, switching to a new cloth after each step: ? The front of your neck, shoulders, and chest. ? Both of your arms, under your arms, and your hands. ? Your stomach and groin area, avoiding the genitals. ? Your right leg and foot. ? Your left leg and foot. ? The back of your neck, your back, and your buttocks. 2. Do not rinse. Discard  the cloth and let the area air-dry. Do not put any substances on your body afterward--such as powder, lotion, or perfume--unless you are told to do so by your health care provider. Only use lotions that are recommended by the manufacturer. 3. Put on clean clothes or pajamas. Contact a health care provider if:  Your skin gets irritated after scrubbing.  You have questions about using your solution or cloth. Get help right away if:  Your eyes become very red or swollen.  Your eyes itch badly.  Your skin itches badly and is red or swollen.  Your hearing changes.  You have trouble seeing.  You have swelling or tingling in your mouth or throat.  You have trouble breathing.  You swallow any chlorhexidine. Summary  Chlorhexidine gluconate (CHG) is a germ-killing (antiseptic) solution that is used to clean the skin. Cleaning your skin with CHG may help to lower your risk for infection.  You may be given CHG to use for bathing. It may be in a bottle or in a prepackaged cloth to use on your skin. Carefully follow your health care provider's instructions and the instructions on the product label.  Do not use CHG if you have a chlorhexidine allergy.  Contact your health care provider if your skin gets irritated after scrubbing. This information is not intended to replace advice given to you by your health care provider. Make sure you discuss any questions you have with your health care provider. Document Revised: 08/16/2018 Document Reviewed: 04/27/2017 Elsevier Patient Education  [place].

## 2020-05-13 ENCOUNTER — Other Ambulatory Visit (HOSPITAL_COMMUNITY)
Admission: RE | Admit: 2020-05-13 | Discharge: 2020-05-13 | Disposition: A | Discharge status: Home / Self Care | Payer: Medicare Other | Source: Ambulatory Visit | Attending: General Surgery | Admitting: General Surgery

## 2020-05-13 ENCOUNTER — Encounter (HOSPITAL_COMMUNITY)
Admission: RE | Admit: 2020-05-13 | Discharge: 2020-05-13 | Disposition: A | Discharge status: Home / Self Care | Payer: Medicare Other | Source: Ambulatory Visit | Attending: General Surgery | Admitting: General Surgery

## 2020-05-13 ENCOUNTER — Encounter (HOSPITAL_COMMUNITY): Payer: Self-pay

## 2020-05-13 ENCOUNTER — Other Ambulatory Visit: Payer: Self-pay

## 2020-05-13 DIAGNOSIS — Z20822 Contact with and (suspected) exposure to covid-19: Secondary | ICD-10-CM | POA: Insufficient documentation

## 2020-05-13 DIAGNOSIS — Z01818 Encounter for other preprocedural examination: Secondary | ICD-10-CM | POA: Insufficient documentation

## 2020-05-13 LAB — BASIC METABOLIC PANEL
Anion gap: 10 (ref 5–15)
BUN: 18 mg/dL (ref 8–23)
CO2: 26 mmol/L (ref 22–32)
Calcium: 10.3 mg/dL (ref 8.9–10.3)
Chloride: 97 mmol/L — ABNORMAL LOW (ref 98–111)
Creatinine, Ser: 0.66 mg/dL (ref 0.44–1.00)
GFR, Estimated: >60 mL/min (ref >60)
Glucose, Bld: 116 mg/dL — ABNORMAL HIGH (ref 70–99)
Potassium: 3.8 mmol/L (ref 3.5–5.1)
Sodium: 133 mmol/L — ABNORMAL LOW (ref 135–145)

## 2020-05-13 LAB — CBC WITH DIFFERENTIAL/PLATELET
Abs Immature Granulocytes: 0.03 10*3/uL (ref 0.00–0.07)
Basophils Absolute: 0.1 10*3/uL (ref 0.0–0.1)
Basophils Relative: 1 %
Eosinophils Absolute: 0.2 10*3/uL (ref 0.0–0.5)
Eosinophils Relative: 3 %
HCT: 42 % (ref 36.0–46.0)
Hemoglobin: 13.5 g/dL (ref 12.0–15.0)
Immature Granulocytes: 1 %
Lymphocytes Relative: 25 %
Lymphs Abs: 1.5 10*3/uL (ref 0.7–4.0)
MCH: 29.5 pg (ref 26.0–34.0)
MCHC: 32.1 g/dL (ref 30.0–36.0)
MCV: 91.9 fL (ref 80.0–100.0)
Monocytes Absolute: 0.4 10*3/uL (ref 0.1–1.0)
Monocytes Relative: 7 %
Neutro Abs: 3.9 10*3/uL (ref 1.7–7.7)
Neutrophils Relative %: 63 %
Platelets: 332 10*3/uL (ref 150–400)
RBC: 4.57 MIL/uL (ref 3.87–5.11)
RDW: 13.2 % (ref 11.5–15.5)
WBC: 6.1 10*3/uL (ref 4.0–10.5)
nRBC: 0 % (ref 0.0–0.2)

## 2020-05-13 LAB — HEMOGLOBIN A1C
Hgb A1c MFr Bld: 5.7 % — ABNORMAL HIGH (ref 4.8–5.6)
Mean Plasma Glucose: 116.89 mg/dL

## 2020-05-13 LAB — SARS CORONAVIRUS 2 (TAT 6-24 HRS): SARS Coronavirus 2: NEGATIVE

## 2020-05-15 ENCOUNTER — Observation Stay (HOSPITAL_COMMUNITY)
Admission: RE | Admit: 2020-05-15 | Discharge: 2020-05-16 | Disposition: A | Discharge status: Home / Self Care | Payer: Medicare Other | Attending: General Surgery | Admitting: General Surgery

## 2020-05-15 ENCOUNTER — Other Ambulatory Visit: Payer: Self-pay

## 2020-05-15 ENCOUNTER — Encounter (HOSPITAL_COMMUNITY)
Admission: RE | Admit: 2020-05-15 | Discharge: 2020-05-15 | Disposition: A | Discharge status: Home / Self Care | Payer: Medicare Other | Source: Ambulatory Visit | Attending: General Surgery | Admitting: General Surgery

## 2020-05-15 ENCOUNTER — Ambulatory Visit (HOSPITAL_COMMUNITY): Payer: Medicare Other | Admitting: Certified Registered"

## 2020-05-15 ENCOUNTER — Encounter (HOSPITAL_COMMUNITY)
Admission: RE | Disposition: A | Discharge status: Home / Self Care | Payer: Self-pay | Source: Home / Self Care | Attending: General Surgery

## 2020-05-15 ENCOUNTER — Encounter (HOSPITAL_COMMUNITY): Payer: Self-pay | Admitting: General Surgery

## 2020-05-15 ENCOUNTER — Encounter (HOSPITAL_COMMUNITY): Payer: Self-pay

## 2020-05-15 DIAGNOSIS — C50911 Malignant neoplasm of unspecified site of right female breast: Secondary | ICD-10-CM | POA: Insufficient documentation

## 2020-05-15 DIAGNOSIS — I10 Essential (primary) hypertension: Secondary | ICD-10-CM | POA: Insufficient documentation

## 2020-05-15 DIAGNOSIS — C50411 Malignant neoplasm of upper-outer quadrant of right female breast: Principal | ICD-10-CM

## 2020-05-15 DIAGNOSIS — Z7982 Long term (current) use of aspirin: Secondary | ICD-10-CM | POA: Insufficient documentation

## 2020-05-15 DIAGNOSIS — R2681 Unsteadiness on feet: Secondary | ICD-10-CM | POA: Insufficient documentation

## 2020-05-15 DIAGNOSIS — R2689 Other abnormalities of gait and mobility: Secondary | ICD-10-CM | POA: Diagnosis not present

## 2020-05-15 DIAGNOSIS — Z9012 Acquired absence of left breast and nipple: Secondary | ICD-10-CM | POA: Diagnosis not present

## 2020-05-15 DIAGNOSIS — E119 Type 2 diabetes mellitus without complications: Secondary | ICD-10-CM | POA: Diagnosis not present

## 2020-05-15 DIAGNOSIS — Z79899 Other long term (current) drug therapy: Secondary | ICD-10-CM | POA: Insufficient documentation

## 2020-05-15 DIAGNOSIS — Z171 Estrogen receptor negative status [ER-]: Secondary | ICD-10-CM

## 2020-05-15 DIAGNOSIS — Z7984 Long term (current) use of oral hypoglycemic drugs: Secondary | ICD-10-CM | POA: Diagnosis not present

## 2020-05-15 DIAGNOSIS — C50919 Malignant neoplasm of unspecified site of unspecified female breast: Secondary | ICD-10-CM | POA: Diagnosis present

## 2020-05-15 HISTORY — PX: SENTINEL NODE BIOPSY: SHX6608

## 2020-05-15 HISTORY — PX: TOTAL MASTECTOMY: SHX6129

## 2020-05-15 LAB — GLUCOSE, CAPILLARY
Glucose-Capillary: 108 mg/dL — ABNORMAL HIGH (ref 70–99)
Glucose-Capillary: 231 mg/dL — ABNORMAL HIGH (ref 70–99)
Glucose-Capillary: 213 mg/dL — ABNORMAL HIGH (ref 70–99)

## 2020-05-15 SURGERY — MASTECTOMY, SIMPLE
Anesthesia: General | Site: Breast | Laterality: Right

## 2020-05-15 MED ORDER — GLYCOPYRROLATE PF 0.2 MG/ML IJ SOSY
PREFILLED_SYRINGE | INTRAMUSCULAR | Status: DC | PRN
  Administered 2020-05-15: .1 mg via INTRAVENOUS

## 2020-05-15 MED ORDER — GLYCOPYRROLATE PF 0.2 MG/ML IJ SOSY
PREFILLED_SYRINGE | INTRAMUSCULAR | Status: AC
  Filled 2020-05-15: qty 1

## 2020-05-15 MED ORDER — METOPROLOL TARTRATE 5 MG/5ML IV SOLN: 5.0000 mg | Freq: Four times a day (QID) | INTRAVENOUS | Status: DC | PRN

## 2020-05-15 MED ORDER — DEXAMETHASONE SODIUM PHOSPHATE 10 MG/ML IJ SOLN
INTRAMUSCULAR | Status: AC
  Filled 2020-05-15: qty 1

## 2020-05-15 MED ORDER — METHYLENE BLUE 0.5 % INJ SOLN
INTRAVENOUS | Status: AC
  Filled 2020-05-15: qty 10

## 2020-05-15 MED ORDER — POLYETHYLENE GLYCOL 3350 17 G PO PACK
17.0000 g | PACK | Freq: Every day | ORAL | Status: DC
  Administered 2020-05-15: 17 g via ORAL
  Filled 2020-05-15: qty 1

## 2020-05-15 MED ORDER — MENTHOL 3 MG MT LOZG
1.0000 | LOZENGE | OROMUCOSAL | Status: DC | PRN
  Administered 2020-05-15: 3 mg via ORAL
  Filled 2020-05-15: qty 9

## 2020-05-15 MED ORDER — TRAMADOL HCL 50 MG PO TABS: 50.0000 mg | ORAL_TABLET | Freq: Four times a day (QID) | ORAL | Status: DC | PRN

## 2020-05-15 MED ORDER — FENTANYL CITRATE (PF) 100 MCG/2ML IJ SOLN
INTRAMUSCULAR | Status: DC | PRN
  Administered 2020-05-15 (×2): 25 ug via INTRAVENOUS
  Administered 2020-05-15 (×2): 50 ug via INTRAVENOUS

## 2020-05-15 MED ORDER — CEFAZOLIN SODIUM-DEXTROSE 2-4 GM/100ML-% IV SOLN
2.0000 g | INTRAVENOUS | Status: AC
  Administered 2020-05-15: 2 g via INTRAVENOUS
  Filled 2020-05-15: qty 100

## 2020-05-15 MED ORDER — LISINOPRIL-HYDROCHLOROTHIAZIDE 20-25 MG PO TABS: 1.0000 | ORAL_TABLET | Freq: Every day | ORAL | Status: DC

## 2020-05-15 MED ORDER — INSULIN ASPART 100 UNIT/ML ~~LOC~~ SOLN
0.0000 [IU] | Freq: Three times a day (TID) | SUBCUTANEOUS | Status: DC
  Administered 2020-05-15 – 2020-05-16 (×2): 5 [IU] via SUBCUTANEOUS

## 2020-05-15 MED ORDER — BUPIVACAINE HCL (PF) 0.5 % IJ SOLN
INTRAMUSCULAR | Status: DC | PRN
  Administered 2020-05-15: 20 mL

## 2020-05-15 MED ORDER — FENTANYL CITRATE (PF) 100 MCG/2ML IJ SOLN
INTRAMUSCULAR | Status: AC
  Filled 2020-05-15: qty 2

## 2020-05-15 MED ORDER — POLYVINYL ALCOHOL 1.4 % OP SOLN: Freq: Every day | OPHTHALMIC | Status: DC | PRN

## 2020-05-15 MED ORDER — HEPARIN SODIUM (PORCINE) 5000 UNIT/ML IJ SOLN
5000.0000 [IU] | Freq: Three times a day (TID) | INTRAMUSCULAR | Status: DC
  Administered 2020-05-16: 5000 [IU] via SUBCUTANEOUS
  Filled 2020-05-15: qty 1

## 2020-05-15 MED ORDER — DIPHENHYDRAMINE HCL 12.5 MG/5ML PO ELIX
12.5000 mg | ORAL_SOLUTION | Freq: Four times a day (QID) | ORAL | Status: DC | PRN
  Administered 2020-05-15: 12.5 mg via ORAL
  Filled 2020-05-15: qty 5

## 2020-05-15 MED ORDER — ONDANSETRON HCL 4 MG/2ML IJ SOLN: 4.0000 mg | Freq: Four times a day (QID) | INTRAMUSCULAR | Status: DC | PRN

## 2020-05-15 MED ORDER — BUPIVACAINE HCL (PF) 0.5 % IJ SOLN
INTRAMUSCULAR | Status: AC
  Filled 2020-05-15: qty 30

## 2020-05-15 MED ORDER — PHENYLEPHRINE 40 MCG/ML (10ML) SYRINGE FOR IV PUSH (FOR BLOOD PRESSURE SUPPORT)
PREFILLED_SYRINGE | INTRAVENOUS | Status: AC
  Filled 2020-05-15: qty 10

## 2020-05-15 MED ORDER — ONDANSETRON HCL 4 MG/2ML IJ SOLN
INTRAMUSCULAR | Status: DC | PRN
  Administered 2020-05-15: 4 mg via INTRAVENOUS

## 2020-05-15 MED ORDER — EPHEDRINE SULFATE-NACL 50-0.9 MG/10ML-% IV SOSY
PREFILLED_SYRINGE | INTRAVENOUS | Status: DC | PRN
  Administered 2020-05-15 (×2): 5 mg via INTRAVENOUS

## 2020-05-15 MED ORDER — SODIUM CHLORIDE (PF) 0.9 % IJ SOLN
INTRAVENOUS | Status: DC | PRN
  Administered 2020-05-15: 3 mL via INTRADERMAL

## 2020-05-15 MED ORDER — CHLORHEXIDINE GLUCONATE 0.12 % MT SOLN
15.0000 mL | Freq: Once | OROMUCOSAL | Status: AC
  Administered 2020-05-15: 15 mL via OROMUCOSAL

## 2020-05-15 MED ORDER — ACETAMINOPHEN 500 MG PO TABS
1000.0000 mg | ORAL_TABLET | Freq: Four times a day (QID) | ORAL | Status: DC
  Administered 2020-05-15 – 2020-05-16 (×2): 1000 mg via ORAL
  Filled 2020-05-15 (×3): qty 2

## 2020-05-15 MED ORDER — TECHNETIUM TC 99M TILMANOCEPT KIT
0.2500 | PACK | Freq: Once | INTRAVENOUS | Status: AC | PRN
  Administered 2020-05-15: 0.25 via INTRADERMAL
  Administered 2020-05-15: 0.229 via INTRADERMAL
  Administered 2020-05-15: 0.24 via INTRADERMAL
  Administered 2020-05-15: 0.222 via INTRADERMAL

## 2020-05-15 MED ORDER — EPHEDRINE 5 MG/ML INJ
INTRAVENOUS | Status: AC
  Filled 2020-05-15: qty 10

## 2020-05-15 MED ORDER — CHLORHEXIDINE GLUCONATE CLOTH 2 % EX PADS: 6.0000 | MEDICATED_PAD | Freq: Once | CUTANEOUS | Status: DC

## 2020-05-15 MED ORDER — LIDOCAINE HCL (PF) 2 % IJ SOLN
INTRAMUSCULAR | Status: AC
  Filled 2020-05-15: qty 5

## 2020-05-15 MED ORDER — DEXAMETHASONE SODIUM PHOSPHATE 4 MG/ML IJ SOLN
INTRAMUSCULAR | Status: DC | PRN
  Administered 2020-05-15: 8 mg via INTRAVENOUS

## 2020-05-15 MED ORDER — DIPHENHYDRAMINE HCL 50 MG/ML IJ SOLN: 12.5000 mg | Freq: Four times a day (QID) | INTRAMUSCULAR | Status: DC | PRN

## 2020-05-15 MED ORDER — LISINOPRIL 10 MG PO TABS
20.0000 mg | ORAL_TABLET | Freq: Every day | ORAL | Status: DC
  Administered 2020-05-15 – 2020-05-16 (×2): 20 mg via ORAL
  Filled 2020-05-15 (×3): qty 2

## 2020-05-15 MED ORDER — PROPOFOL 10 MG/ML IV BOLUS
INTRAVENOUS | Status: DC | PRN
  Administered 2020-05-15 (×2): 40 mg via INTRAVENOUS
  Administered 2020-05-15: 120 mg via INTRAVENOUS

## 2020-05-15 MED ORDER — ONDANSETRON HCL 4 MG/2ML IJ SOLN: 4.0000 mg | Freq: Once | INTRAMUSCULAR | Status: DC | PRN

## 2020-05-15 MED ORDER — SODIUM CHLORIDE (PF) 0.9 % IJ SOLN
INTRAMUSCULAR | Status: AC
  Filled 2020-05-15: qty 10

## 2020-05-15 MED ORDER — PENTAFLUOROPROP-TETRAFLUOROETH EX AERO
INHALATION_SPRAY | CUTANEOUS | Status: AC
  Filled 2020-05-15: qty 116

## 2020-05-15 MED ORDER — 0.9 % SODIUM CHLORIDE (POUR BTL) OPTIME
TOPICAL | Status: DC | PRN
  Administered 2020-05-15: 1000 mL

## 2020-05-15 MED ORDER — FENTANYL CITRATE (PF) 100 MCG/2ML IJ SOLN: 25.0000 ug | INTRAMUSCULAR | Status: DC | PRN

## 2020-05-15 MED ORDER — LACTATED RINGERS IV SOLN
INTRAVENOUS | Status: DC
  Administered 2020-05-15: 1000 mL via INTRAVENOUS

## 2020-05-15 MED ORDER — ONDANSETRON HCL 4 MG/2ML IJ SOLN
INTRAMUSCULAR | Status: AC
  Filled 2020-05-15: qty 2

## 2020-05-15 MED ORDER — LACTATED RINGERS IV SOLN: INTRAVENOUS | Status: DC

## 2020-05-15 MED ORDER — LIDOCAINE 2% (20 MG/ML) 5 ML SYRINGE
INTRAMUSCULAR | Status: DC | PRN
  Administered 2020-05-15: 60 mg via INTRAVENOUS

## 2020-05-15 MED ORDER — DIPHENHYDRAMINE-APAP (SLEEP) 25-500 MG PO TABS: 1.0000 | ORAL_TABLET | Freq: Every day | ORAL | Status: DC

## 2020-05-15 MED ORDER — LACTATED RINGERS IV SOLN: INTRAVENOUS | Status: DC | PRN

## 2020-05-15 MED ORDER — GABAPENTIN 300 MG PO CAPS: 300.0000 mg | ORAL_CAPSULE | Freq: Three times a day (TID) | ORAL | Status: DC | PRN

## 2020-05-15 MED ORDER — HYDROCHLOROTHIAZIDE 25 MG PO TABS
25.0000 mg | ORAL_TABLET | Freq: Every day | ORAL | Status: DC
  Administered 2020-05-15 – 2020-05-16 (×2): 25 mg via ORAL
  Filled 2020-05-15 (×3): qty 1

## 2020-05-15 MED ORDER — MORPHINE SULFATE (PF) 2 MG/ML IV SOLN: 2.0000 mg | INTRAVENOUS | Status: DC | PRN

## 2020-05-15 MED ORDER — POLYVINYL ALCOHOL 1.4 % OP SOLN: 2.0000 [drp] | Freq: Every day | OPHTHALMIC | Status: DC | PRN

## 2020-05-15 MED ORDER — AMLODIPINE BESYLATE 5 MG PO TABS
5.0000 mg | ORAL_TABLET | Freq: Every day | ORAL | Status: DC
  Filled 2020-05-15: qty 1

## 2020-05-15 MED ORDER — PROPOFOL 10 MG/ML IV BOLUS
INTRAVENOUS | Status: AC
  Filled 2020-05-15: qty 20

## 2020-05-15 MED ORDER — DOCUSATE SODIUM 100 MG PO CAPS
100.0000 mg | ORAL_CAPSULE | Freq: Two times a day (BID) | ORAL | Status: DC
  Administered 2020-05-15 – 2020-05-16 (×3): 100 mg via ORAL
  Filled 2020-05-15 (×3): qty 1

## 2020-05-15 MED ORDER — ORAL CARE MOUTH RINSE: 15.0000 mL | Freq: Once | OROMUCOSAL | Status: AC

## 2020-05-15 MED ORDER — PHENOL 1.4 % MT LIQD
1.0000 | OROMUCOSAL | Status: DC | PRN
  Administered 2020-05-15: 1 via OROMUCOSAL
  Filled 2020-05-15: qty 177

## 2020-05-15 MED ORDER — ONDANSETRON 4 MG PO TBDP: 4.0000 mg | ORAL_TABLET | Freq: Four times a day (QID) | ORAL | Status: DC | PRN

## 2020-05-15 SURGICAL SUPPLY — 52 items
ADH SKN CLS APL DERMABOND .7 (GAUZE/BANDAGES/DRESSINGS) ×3
APL PRP STRL LF DISP 70% ISPRP (MISCELLANEOUS) ×3
APPLIER CLIP 9.375 SM OPEN (CLIP) ×4
APR CLP SM 9.3 20 MLT OPN (CLIP) ×3
BLADE SURG 15 STRL LF DISP TIS (BLADE) ×3 IMPLANT
BLADE SURG 15 STRL SS (BLADE) ×4
CHLORAPREP W/TINT 26 (MISCELLANEOUS) ×4 IMPLANT
CLIP APPLIE 9.375 SM OPEN (CLIP) ×3 IMPLANT
COVER PROBE W GEL 5X96 (DRAPES) ×4 IMPLANT
COVER SURGICAL LIGHT HANDLE (MISCELLANEOUS) ×4 IMPLANT
COVER WAND RF STERILE (DRAPES) ×4 IMPLANT
DECANTER SPIKE VIAL GLASS SM (MISCELLANEOUS) ×4 IMPLANT
DERMABOND ADVANCED (GAUZE/BANDAGES/DRESSINGS) ×1
DERMABOND ADVANCED .7 DNX12 (GAUZE/BANDAGES/DRESSINGS) ×3 IMPLANT
ELECT CAUTERY BLADE 6.4 (BLADE) ×2 IMPLANT
ELECT REM PT RETURN 9FT ADLT (ELECTROSURGICAL) ×4
ELECTRODE REM PT RTRN 9FT ADLT (ELECTROSURGICAL) ×3 IMPLANT
EVACUATOR DRAINAGE 10X20 100CC (DRAIN) ×1 IMPLANT
EVACUATOR SILICONE 100CC (DRAIN) ×4
GAUZE SPONGE 4X4 12PLY STRL (GAUZE/BANDAGES/DRESSINGS) ×2 IMPLANT
GLOVE BIO SURGEON STRL SZ 6.5 (GLOVE) ×4 IMPLANT
GLOVE BIOGEL PI IND STRL 6.5 (GLOVE) ×3 IMPLANT
GLOVE BIOGEL PI IND STRL 7.0 (GLOVE) ×6 IMPLANT
GLOVE BIOGEL PI INDICATOR 6.5 (GLOVE) ×1
GLOVE BIOGEL PI INDICATOR 7.0 (GLOVE) ×2
GOWN STRL REUS W/TWL LRG LVL3 (GOWN DISPOSABLE) ×8 IMPLANT
INST SET MINOR GENERAL (KITS) ×4 IMPLANT
KIT TURNOVER KIT A (KITS) ×4 IMPLANT
NDL HYPO 18GX1.5 BLUNT FILL (NEEDLE) ×2 IMPLANT
NDL HYPO 25X1 1.5 SAFETY (NEEDLE) ×4 IMPLANT
NEEDLE HYPO 18GX1.5 BLUNT FILL (NEEDLE) ×4 IMPLANT
NEEDLE HYPO 25X1 1.5 SAFETY (NEEDLE) ×8 IMPLANT
NS IRRIG 1000ML POUR BTL (IV SOLUTION) ×4 IMPLANT
PACK MINOR (CUSTOM PROCEDURE TRAY) ×4 IMPLANT
PAD ABD 5X9 TENDERSORB (GAUZE/BANDAGES/DRESSINGS) ×4 IMPLANT
PAD ARMBOARD 7.5X6 YLW CONV (MISCELLANEOUS) ×8 IMPLANT
PENCIL HANDSWITCHING (ELECTRODE) ×2 IMPLANT
SET BASIN LINEN APH (SET/KITS/TRAYS/PACK) ×4 IMPLANT
SPONGE DRAIN TRACH 4X4 STRL 2S (GAUZE/BANDAGES/DRESSINGS) ×2 IMPLANT
SPONGE LAP 18X18 RF (DISPOSABLE) ×4 IMPLANT
STAPLER VISISTAT (STAPLE) ×2 IMPLANT
SUT ETHILON 3 0 FSL (SUTURE) ×2 IMPLANT
SUT MNCRL AB 4-0 PS2 18 (SUTURE) ×6 IMPLANT
SUT SILK 2 0 (SUTURE) ×4
SUT SILK 2 0 SH (SUTURE) ×4 IMPLANT
SUT SILK 2-0 18XBRD TIE 12 (SUTURE) ×1 IMPLANT
SUT VIC AB 2-0 CT1 27 (SUTURE) ×16
SUT VIC AB 2-0 CT1 TAPERPNT 27 (SUTURE) ×4 IMPLANT
SUT VIC AB 3-0 SH 27 (SUTURE) ×4
SUT VIC AB 3-0 SH 27X BRD (SUTURE) ×5 IMPLANT
SYR BULB IRRIG 60ML STRL (SYRINGE) ×4 IMPLANT
SYR CONTROL 10ML LL (SYRINGE) ×8 IMPLANT

## 2020-05-15 NOTE — Care Management Obs Status (Signed)
[place] NOTIFICATION   Patient Details  Name: Christine Sutton MRN: [id number] Date of Birth: [birth year]   Medicare Observation Status Notification Given:  Yes    Iona Beard, [place] 05/15/2020, 6:01 PM

## 2020-05-15 NOTE — Interval H&P Note (Signed)
History and Physical Interval Note:  05/15/2020 8:30 AM  Christine Sutton  has presented today for surgery, with the diagnosis of Right Breast Cancer.  The various methods of treatment have been discussed with the patient and family. After consideration of risks, benefits and other options for treatment, the patient has consented to  Procedure(s) with comments: [place] (Right) - sentinel node @ 7:30 as a surgical intervention.  The patient's history has been reviewed, patient examined, no change in status, stable for surgery.  I have reviewed the patient's chart and labs.  Questions were answered to the patient's satisfaction.    Right breast marked. Discussed sentinel node sampling. Discussed that Dr. Arnoldo Morale will see tomorrow for dc.  Virl Cagey

## 2020-05-15 NOTE — Anesthesia Procedure Notes (Addendum)
Procedure Name: LMA Insertion Date/Time: 05/15/2020 9:19 AM Performed by: Orlie Dakin, CRNA Pre-anesthesia Checklist: Patient identified, Emergency Drugs available, Suction available, Patient being monitored and Timeout performed Patient Re-evaluated:Patient Re-evaluated prior to induction Oxygen Delivery Method: Circle system utilized Preoxygenation: Pre-oxygenation with 100% oxygen Induction Type: IV induction LMA: LMA inserted LMA Size: 4.0 Number of attempts: 1 Placement Confirmation: positive ETCO2 Tube secured with: Tape

## 2020-05-15 NOTE — Anesthesia Preprocedure Evaluation (Addendum)
Anesthesia Evaluation  Patient identified by MRN, date of birth, ID band Patient awake    Reviewed: Allergy & Precautions, H&P , NPO status , Patient's Chart, lab work & pertinent test results, reviewed documented beta blocker date and time   Airway Mallampati: II  TM Distance: >3 FB Neck ROM: full    Dental no notable dental hx. (+) Teeth Intact, Dental Advisory Given, Caps   Pulmonary neg pulmonary ROS,    Pulmonary exam normal breath sounds clear to auscultation       Cardiovascular Exercise Tolerance: Good hypertension, negative cardio ROS   Rhythm:regular Rate:Normal     Neuro/Psych negative neurological ROS  negative psych ROS   GI/Hepatic negative GI ROS, Neg liver ROS,   Endo/Other  negative endocrine ROSdiabetes, Type 2  Renal/GU negative Renal ROS  negative genitourinary   Musculoskeletal   Abdominal   Peds  Hematology negative hematology ROS (+)   Anesthesia Other Findings   Reproductive/Obstetrics negative OB ROS                            Anesthesia Physical Anesthesia Plan  ASA: II  Anesthesia Plan: General   Post-op Pain Management:    Induction:   PONV Risk Score and Plan: Ondansetron  Airway Management Planned:   Additional Equipment:   Intra-op Plan:   Post-operative Plan:   Informed Consent: I have reviewed the patients History and Physical, chart, labs and discussed the procedure including the risks, benefits and alternatives for the proposed anesthesia with the patient or authorized representative who has indicated his/her understanding and acceptance.     Dental Advisory Given  Plan Discussed with: CRNA  Anesthesia Plan Comments:         Anesthesia Quick Evaluation

## 2020-05-15 NOTE — Evaluation (Signed)
Physical Therapy Evaluation Patient Details Name: Christine Sutton MRN: [id number] DOB: [birth year] Today's Date: 05/15/2020   History of Present Illness  Christine Sutton is a 86 y/o female, s/p Right total mastectomy with sentinel lymph node biopsy on 05/15/20 with the diagnosis of Right Breast Cancer.    Clinical Impression  Patient instructed and demonstrates good return for completing HEP for post op mastectomy and given written instructions.  Patient demonstrates good return for ambulation in room/hallway and transferring to commode in bathroom and chair.  Patient tolerated sitting up in chair after therapy.  Plan:  Patient discharged from physical therapy to care of nursing for ambulation daily as tolerated for length of stay.    Follow Up Recommendations No PT follow up    Equipment Recommendations  None recommended by PT    Recommendations for Other Services       Precautions / Restrictions Precautions Precautions: None Restrictions Weight Bearing Restrictions: No      Mobility  Bed Mobility Overal bed mobility: Modified Independent                  Transfers Overall transfer level: Modified independent                  Ambulation/Gait Ambulation/Gait assistance: Modified independent (Device/Increase time) Gait Distance (Feet): 200 Feet Assistive device: None Gait Pattern/deviations: WFL(Within Functional Limits) Gait velocity: slightly decreased   General Gait Details: grossly WFL, demonstrates slightly unsteady cadence wihout loss of balance  Stairs            Wheelchair Mobility    Modified Rankin (Stroke Patients Only)       Balance Overall balance assessment: Mild deficits observed, not formally tested                                           Pertinent Vitals/Pain Pain Assessment: No/denies pain    Home Living Family/patient expects to be discharged to:: Private residence Living Arrangements:  Alone Available Help at Discharge: Family;Available 24 hours/day Type of Home: House Home Access: Stairs to enter Entrance Stairs-Rails: None Entrance Stairs-Number of Steps: 1 Home Layout: One level Home Equipment: Walker - 2 wheels;Walker - 4 wheels;Cane - single point;Shower seat - built in;Grab bars - tub/shower;Bedside commode      Prior Function Level of Independence: Independent         Comments: Community ambulator without AD, drives     Hand Dominance        Extremity/Trunk Assessment   Upper Extremity Assessment Upper Extremity Assessment: Overall WFL for tasks assessed    Lower Extremity Assessment Lower Extremity Assessment: Overall WFL for tasks assessed    Cervical / Trunk Assessment Cervical / Trunk Assessment: Normal  Communication   Communication: No difficulties  Cognition Arousal/Alertness: Awake/alert Behavior During Therapy: WFL for tasks assessed/performed Overall Cognitive Status: Within Functional Limits for tasks assessed                                        General Comments      Exercises     Assessment/Plan    PT Assessment Patent does not need any further PT services  PT Problem List         PT Treatment Interventions      PT  Goals (Current goals can be found in the Care Plan section)  Acute Rehab PT Goals Patient Stated Goal: return home PT Goal Formulation: With patient Time For Goal Achievement: 05/15/20 Potential to Achieve Goals: Good    Frequency     Barriers to discharge        Co-evaluation               AM-PAC PT "6 Clicks" Mobility  Outcome Measure Help needed turning from your back to your side while in a flat bed without using bedrails?: None Help needed moving from lying on your back to sitting on the side of a flat bed without using bedrails?: None Help needed moving to and from a bed to a chair (including a wheelchair)?: None Help needed standing up from a chair using your  arms (e.g., wheelchair or bedside chair)?: None Help needed to walk in hospital room?: None Help needed climbing 3-5 steps with a railing? : None 6 Click Score: 24    End of Session   Activity Tolerance: Patient tolerated treatment well Patient left: in chair;with call bell/phone within reach Nurse Communication: Mobility status PT Visit Diagnosis: Unsteadiness on feet (R26.81);Other abnormalities of gait and mobility (R26.89);Muscle weakness (generalized) (M62.81)    Time: 8416-6063 PT Time Calculation (min) (ACUTE ONLY): 24 min   Charges:   PT Evaluation $PT Eval Moderate Complexity: 1 Mod PT Treatments $Therapeutic Activity: 23-37 mins        3:45 PM, 05/15/20 Lonell Grandchild, MPT Physical Therapist with [facility] [address] [phone] office [phone] mobile phone

## 2020-05-15 NOTE — Op Note (Signed)
[facility] Operative Note  05/15/20  Preoperative Diagnosis:  Right breast cancer    Postoperative Diagnosis: Same   Procedure(s) Performed:  Right total mastectomy with sentinel lymph node biopsy    Surgeon: Lanell Matar. Constance Haw, MD   Assistants: No qualified resident was available    Anesthesia: General endotracheal   Anesthesiologist: Louann Sjogren, MD    Specimens:  Sentinel lymph nodes, Right breast suture superior    Estimated Blood Loss: Minimal   Blood Replacement: None    Complications: None   Wound Class: Clean    Operative Indications: Ms. Peaden is a 86 yo who has a history of newly diagnosed right breast cancer that is triple negative. She had a prior left breast cancer in the past s/p mastectomy. I discussed the case with Dr. Delton Coombes and the patient and we opted for sentinel lymph node sampling and total mastectomy. We discussed the risk of bleeding, infection, needing more surgery, needing additional treatments, lymphedema, and she opted to proceed.   Findings:  Right breast with bruising and palpable hematoma from her biopsy    Procedure: The patient had previously gone to nuclear medicine for radiotracer injection for the sentinel lymph node. She was then was taken to the operating room and placed supine. General endotracheal anesthesia was induced. Intravenous antibiotics were administered per protocol.  The right breast was cleaned with alcohol and blue dye was injected intradermally around the nipple in the standard fashion. Five minutes of massaging the breast proceeded.  The right axilla and breast were prepared and draped in the usual sterile fashion.   The gamma probe was used to identify the hottest spot in the axilla at the hairline and an incision was made and carried down through to the axillary fat.  A blue lymphatic channel was noted and deeper to that a hot blue node was encountered. In vivo the reading was 14996 and ex vivo it  was 30180.  This was dissected out with sharp and blunt dissection and clips were used to control the lymph channels and blood vessels. I could palpate some additional nodes in a chain just inferior to this one, and the background reads were only in the 180s but given that they were immediately in the channel I dissected further. A small blue and hot node was noted and in vivo was 1617. This was excised in a similar fashion with sharp dissection and clip application. Ex vivo it was 1846.  The additional back ground in the axilla was was in the 20-80s following this excision.   A laparotomy pad was placed in the axilla for hemostasis. Attention was turned to the mastectomy.    An elliptical incision was made encompassing the nipple areola complex and prior biopsy scar in the transverse direction across the chest.  Flaps were raised in the avascular plane between the subcutaneous tissue and breast tissue from the clavicle superiorly, the sternum medially, the anterior rectus sheath inferiorly and past the lateral border of the pectoralis major.  Hemostasis of the flaps were achieved.   Next the breast tissue and underlying pectoralis fascia were excised from the pectoralis major muscle progressing from medial to lateral. At the lateral border of the pectoralis major muscle the breast tissue was swung lateral and a pedicle identified where the breast tissue gave way to fat in the axilla. The lateral pedicle was incised and the specimen was removed. The breast was marked with a suture superior.   The wound was irrigated and  hemostasis was confirmed. Small penetrating vessel was clipped.  A JP drain was placed and brought out through a stab incision laterally. This was secured with a 3-0 Nylon.  The mastectomy incision was closed with 2-0 Vicryl interrupted and staples. The axilla incision was closed with 3-0 Vicryl interrupted deep and 4-0 Monocryl subcuticular and dermabond.  Gauze and ABD were placed over the  mastectomy incision and a breast binder was placed.   Final inspection revealed acceptable hemostasis. All counts were correct at the end of the case. The patient was awakened from anesthesia and extubated without complication.  The patient went to the PACU in stable condition.   Curlene Labrum, MD [facility] [address] [place], [place] [address] [phone] (office)

## 2020-05-15 NOTE — Progress Notes (Signed)
[facility]  Notified son Balinda Quails surgery completed. Breast binder in place. Stay overnight for pain control. PT to evaluate to help with post op exercises. Allergic to Codeine so gave tramadol. Diabetic diet. Held metaformin and did SSI for now.  Dr. Arnoldo Morale will see patient tomorrow and see if ready for home. JP drain to remain in until seen in office. 12/9 follow up for wound and drain check.  Curlene Labrum, MD [facility] [address] [place], [place] [address] [phone] (office)

## 2020-05-15 NOTE — Plan of Care (Signed)

## 2020-05-15 NOTE — Anesthesia Procedure Notes (Deleted)
Performed by: Iylah Dworkin F, CRNA       

## 2020-05-15 NOTE — Transfer of Care (Signed)
Immediate Anesthesia Transfer of Care Note  Patient: Christine Sutton  Procedure(s) Performed: TOTAL MASTECTOMY RIGHT BREAST (Right Breast) SENTINEL NODE BIOPSY (Right Axilla)  Patient Location: PACU  Anesthesia Type:General  Level of Consciousness: awake, alert  and oriented  Airway & Oxygen Therapy: Patient Spontanous Breathing  Post-op Assessment: Report given to RN and Post -op Vital signs reviewed and stable  Post vital signs: Reviewed and stable  Last Vitals:  Vitals Value Taken Time  BP 135/65 05/15/20 1150  Temp 36.9 C 05/15/20 1150  Pulse 85 05/15/20 1153  Resp 15 05/15/20 1153  SpO2 94 % 05/15/20 1153  Vitals shown include unvalidated device data.  Last Pain:  Vitals:   05/15/20 0757  TempSrc: Oral  PainSc: 0-No pain      Patients Stated Pain Goal: 6 (09/14/57 1368)  Complications: No complications documented.

## 2020-05-15 NOTE — Anesthesia Postprocedure Evaluation (Signed)
Anesthesia Post Note  Patient: ELIZA GREEN  Procedure(s) Performed: TOTAL MASTECTOMY RIGHT BREAST (Right Breast) SENTINEL NODE BIOPSY (Right Axilla)  Patient location during evaluation: PACU Anesthesia Type: General Level of consciousness: awake and alert and oriented Vital Signs Assessment: post-procedure vital signs reviewed and stable Respiratory status: spontaneous breathing, nonlabored ventilation and respiratory function stable Cardiovascular status: blood pressure returned to baseline and stable Postop Assessment: no apparent nausea or vomiting Anesthetic complications: no   No complications documented.   Last Vitals:  Vitals:   05/15/20 0900 05/15/20 1150  BP: (!) 141/75 135/65  Pulse: 77 87  Resp: 16 10  Temp:  36.9 C  SpO2: 92% 92%    Last Pain:  Vitals:   05/15/20 1150  TempSrc:   PainSc: 0-No pain                 Orlie Dakin

## 2020-05-16 DIAGNOSIS — E119 Type 2 diabetes mellitus without complications: Secondary | ICD-10-CM | POA: Diagnosis not present

## 2020-05-16 DIAGNOSIS — Z9012 Acquired absence of left breast and nipple: Secondary | ICD-10-CM | POA: Diagnosis not present

## 2020-05-16 DIAGNOSIS — I10 Essential (primary) hypertension: Secondary | ICD-10-CM | POA: Diagnosis not present

## 2020-05-16 DIAGNOSIS — C50411 Malignant neoplasm of upper-outer quadrant of right female breast: Secondary | ICD-10-CM | POA: Diagnosis not present

## 2020-05-16 DIAGNOSIS — Z7982 Long term (current) use of aspirin: Secondary | ICD-10-CM | POA: Diagnosis not present

## 2020-05-16 DIAGNOSIS — Z7984 Long term (current) use of oral hypoglycemic drugs: Secondary | ICD-10-CM | POA: Diagnosis not present

## 2020-05-16 LAB — GLUCOSE, CAPILLARY: Glucose-Capillary: 205 mg/dL — ABNORMAL HIGH (ref 70–99)

## 2020-05-16 NOTE — Discharge Instructions (Signed)
Discharge instructions after breast surgery:   Common Complaints: Pain and bruising at the incision sites.  Swelling at the incision sites. Stiffness of the arm.  Some drainage from the breast incision.   Diet/ Activity: Diet as tolerated.  You may shower but do not take hot showers as this can disrupt the glue in your armpit. Rest and listen to your body, but do not remain in bed all day.  Walk everyday for at least 15-20 minutes. Deep cough and move around every 1-2 hours in the first few days after surgery.  Do not lift > 10 lbs for the first 2 weeks after surgery. Do not do anything that makes you feel like you are putting unnecessary pull or stretch on the incision sites.  Do move your arm and shoulder (see exercises options below). If you do not move then you can get stiff and hurt more.  Do not pick at the dermabond glue on your incision sites.  This glue film will remain in place for 1-2 weeks and will start to peel off.  Wear your breast binder to help with post operative pain. Keep a clean pad (maxipads work) to cover your incision in case there is drainage.  Do you pick at your staples. Keep covered with dry pad.  Do not place lotions or balms on your incision unless instructed to specifically by Dr. Constance Haw.   Drain Instructions:  Please keep the drain clean and dry. Replace the gauze/ tape around the drain if it gets dirty or wet/ saturated. Please do not mess with or cut the stitch that is keeping the drain in place. Secure the drain to your clothes so that it does not get dislodged.  Please record the output from the drain daily including the color and the amount in milliliters.  Please keep the drain covered with plastic and tape when you shower so that it does not get wet.     Pain Expectations and Narcotics: -After surgery you will have pain associated with your incisions and this is normal. The pain is muscular and nerve pain, and will get better with time. -You are  encouraged and expected to take non narcotic medications like tylenol and ibuprofen (when able) to treat pain as multiple modalities can aid with pain treatment. -Narcotics are only used when pain is severe or there is breakthrough pain. -You are not expected to have a pain score of 0 after surgery, as we cannot prevent pain. A pain score of 3-4 that allows you to be functional, move, walk, and tolerate some activity is the goal. The pain will continue to improve over the days after surgery and is dependent on your surgery. -Due to [place] law, we are only able to give a certain amount of pain medication to treat post operative pain, and we only give additional narcotics on a patient by patient basis.  -For most laparoscopic surgery, studies have shown that the majority of patients only need 10-15 narcotic pills, and for open surgeries most patients only need 15-20.   -Having appropriate expectations of pain and knowledge of pain management with non narcotics is important as we do not want anyone to become addicted to narcotic pain medication.  -Using ice packs in the first 48 hours and heating pads after 48 hours, wearing an abdominal binder (when recommended), and using over the counter medications are all ways to help with pain management.   -Simple acts like meditation and mindfulness practices after surgery can also help  with pain control and research has proven the benefit of these practices.  Medication: Take tylenol and ibuprofen as needed for pain control, alternating every 4-6 hours.  Example:  Tylenol 1000mg  @ 6am, 12noon, 6pm, 11midnight (Do not exceed 4000mg  of tylenol a day). Ibuprofen 800mg  @ 9am, 3pm, 9pm, 3am (Do not exceed 3600mg  of ibuprofen a day).  Take Tramadol for breakthrough pain every 4 hours.  Take Colace for constipation related to narcotic pain medication. If you do not have a bowel movement in 2 days, take Miralax over the counter.  Drink plenty of water to also prevent  constipation.   Contact Information: If you have questions or concerns, please call our office, [phone], Monday- Thursday 8AM-5PM and Friday 8AM-12Noon.  If it is after hours or on the weekend, please call [facility] Main Number, [phone] or [facility] Main Number, [phone], and ask to speak to the surgeon on call for Dr. Constance Haw at [facility].   Exercises After Breast Surgery Do at least a few of the exercises below twice a day. It is ok to start the day after surgery and gradually build up the amount and type of exercises you do. Link to the exercises with pictures ([email or web address]).   Deep Breathing Exercise Deep breathing can help you relax and ease discomfort and tightness around your incision (surgical cut). It's also a very good way to relieve stress during the day.  Sit comfortably in a chair. Take a slow, deep breath through your nose. Let your chest and belly expand. Breathe out slowly through your mouth. Repeat as many times as needed.  Arm and Shoulder Exercises Doing arm and shoulder exercises will help you get back your full range of motion on your affected side (the side where you had your surgery). With full range of motion, you'll be able to: Move your arm over your head and out to the side Move your arm behind your neck Move your arm to the middle of your back Do each of the exercises below 5 times a day. Keep doing this until you have a full range of motion again and can use your arm as you did before surgery in all your normal activities. This includes activities at work, at home, and in recreation or sports. If you had limited movement in your arm before surgery, your goal will be to get back as much movement as you had before.  If you get your full range of motion back quickly, keep doing these exercises once a day instead of 5 times a day. This is especially true if you feel any tightness in  your chest, shoulder, or under your affected arm. These exercises can help keep scar tissue from forming in your armpit and shoulder. Scar tissue can limit your arm movements later.  If you still have trouble moving your shoulder 4 weeks after your surgery, tell your surgeon. They'll tell you if you need more rehabilitation, such as physical or occupational therapy.  If you had one of the following surgeries, you can do the following set of exercises on the first day after your surgery, as long as your surgeon tells you it's safe.  Shoulder rolls The shoulder roll is a good exercise to start with because it gently stretches your chest and shoulder muscles.  Stand or sit comfortably with your arms relaxed at your sides. Start with backward shoulder rolls. In a circular motion, bring your shoulders forward, up, backward, and down. Do this 10 times. Switch directions and  do 10 forward shoulder rolls. Bring your shoulders backward, up, forward, and down. Do this 10 times. Try to make the circles as big as you can and move both shoulders at the same time. If you have some tightness across your incision or chest, start with smaller circles and make them bigger as the tightness decreases. The backward direction might feel a little tighter across your chest than the forward direction. This will get better with practice.  Shoulder wings The shoulder wings exercise will help you get back outward movement of your shoulder. You can do this exercise while sitting or standing.  Place your hands on your chest or collarbone. Raise your elbows out to the side, limiting your range of motion as instructed by your healthcare team. Slowly lower your elbows. Do this 10 times. Then, slowly lower your hands. If you feel discomfort while doing this exercise, hold your position and do the deep breathing exercise. If the discomfort passes, raise your elbows a little higher. If it doesn't pass, don't raise your elbows any  higher. Finish the exercise raising your elbows only high enough to feel a gentle stretch and no discomfort.  Arm circles If you had surgery on both breasts, do this exercise with both arms, 1 arm at a time. Don't do this exercise with both arms at the same time. This will put too much pressure on your chest.  Stand with your feet slightly apart for balance. Raise your affected arm out to the side as high as you can, limiting your range of movement as instructed by your healthcare team. Start making slow, backward circles in the air with your arm. Make sure you're moving your arm from your shoulder, not your elbow. Keep your elbow straight. Increase the size of the circles until they're as big as you can comfortably make them, limiting your range of motion as instructed by your healthcare team. If you feel any aching or if your arm is tired, take a break. Keep doing the exercise when you feel better. Do 10 full backward circles. Then, slowly lower your arm to your side. Rest your arm for a moment. Follow steps 1 to 4 again, but this time make slow, forward circles.  W exercise You can do the W exercise while sitting or standing.  Form a "W" with your arms out to the side and palms facing forward (see Figure 4). Try to bring your hands up so they're even with your face. If you can't raise your arms that high, bring them to the highest comfortable position. Make sure to limit your range of motion as instructed by your healthcare team. Pinch your shoulder blades together and downward, as if you're squeezing a pencil between them. If you feel discomfort, stop at that position and do the deep breathing exercise. If the discomfort passes, try to bring your arms back a little further. If it doesn't pass, don't reach any further. Hold the furthest position that doesn't cause discomfort. Squeeze your shoulder blades together and downward for 5 seconds. Slowly bring your arms back down to the starting  position. Repeat this movement 10 times.  Back Climb You can do the back climb stretch while sitting or standing. You'll need a timer or stopwatch.  Place your hands behind your back. Hold the hand on your affected side with your other hand. If you had surgery on both breasts, use the arm that moves most easily to hold the other. Slowly slide your hands up the center  of your back as far as you can. If you feel tightness near your incision, stop at that position and do the deep breathing exercise. If the tightness decreases, try to slide your hands up a little further. If it doesn't decrease, don't slide your hands up any further. Hold the highest position you can for 1 minute. Use your stopwatch or timer to keep track. You should feel a gentle stretch in your shoulder area. After 1 minute, slowly lower your hands.  Hands behind neck You can do the hands behind neck stretch while sitting or standing. You'll need a timer or stopwatch.  Clasp your hands together on your lap or in front of you. Slowly raise your hands toward your head, keeping your elbows together in front of you, not out to the sides. Keep your head level. Don't bend your neck or head forward. Slide your hands over your head until you reach the back of your neck. When you get to this point, spread your elbows out to the sides. Hold this position for 1 minute. Use your stopwatch or timer to keep track. Breathe normally. Don't hold your breath as you stretch your body. If you have some tightness across your incision or chest, hold your position and do the deep breathing exercise. If the tightness decreases, continue with the movement. If the tightness stays the same, reach up and stretch your elbows back as best as you can without causing discomfort. Hold the position you're most comfortable in for 1 minute. Slowly come out of the stretch by bringing your elbows together and sliding your hands over your head. Then, slowly lower your  arms.  Forward wall crawls You'll need 2 pieces of tape for the forward wall crawl exercise.  Stand facing a wall. Your toes should be about 6 inches (15 centimeters) from the wall. Reach as high as you can with your unaffected arm. Elta Guadeloupe that point with a piece of tape. This will be the goal for your affected arm. If you had surgery on both breasts, set your goal using the arm that moves most comfortably. Place both hands against the wall at a level that's comfortable. Crawl your fingers up the wall as far as you can, keeping them even with each other.. Try not to look up toward your hands or arch your back. When you get to the point where you feel a good stretch, but not pain, do the deep breathing exercise. Return to the starting position by crawling your fingers back down the wall. Repeat the wall crawl 10 times. Each time you raise your hands, try to crawl a little bit higher. On the 10th crawl, use the other piece of tape to Denishia Citro the highest point you reached with your affected arm. This will let you to see your progress each time you do this exercise. As you become more flexible, you may need to take a step closer to the wall so you can reach a little higher.   Side wall crawls You'll also need 2 pieces of tape for the side wall crawl exercise.  You shouldn't feel pain while doing this exercise. It's normal to feel some tightness or pulling across the side of your chest. Focus on your breathing until the tightness decreases. Breathe normally throughout this exercise. Don't hold your breath.  Be careful not to turn your body toward the wall while doing this exercise. Make sure only the side of your body faces the wall.  If you had surgery on  both breasts, start with step 3.  Stand with your unaffected side closest to the wall, about 1 foot (30.5 centimeters) away from the wall. Reach as high as you can with your unaffected arm. Elta Guadeloupe that point with a piece of tape (see Figure 8). This  will be the goal for your affected arm. Turn your body so your affected side is now closest to the wall. If you had surgery on both breasts, start with either side closest to the wall. Crawl your fingers up the wall as far as you can. When you get to the point where you feel a good stretch, but not pain, do the deep breathing exercise. Return to the starting position by crawling your fingers back down the wall. Repeat this exercise 10 times. On your 10th crawl, use a piece of tape to Elara Cocke the highest point you reached with your affected arm. This will let you see your progress each time you do the exercise. If you had surgery on both breasts, repeat the exercise with your other arm.  Swelling After your surgery, you may have some swelling or puffiness in your hand or arm on your affected side. This is normal and usually goes away on its own.  If you notice swelling in your hand or arm, follow the tips below to help the swelling go away.  Raise your arm above your head and do hand pumps several times a day. To do hand pumps, slowly open and close your fist 10 times. This will help drain the fluid out of your arm. Don't hold your arm straight up over your head for more than a few minutes. This can cause your arm muscles to get tired. Raise your arm to the side a few times a day for about 20 minutes at a time. To do this, sit or lie down on your back. Rest your arm on a few pillows next to you so it's raised above the level of your heart. If you're able to sleep on your unaffected side, you can place 1 or 2 pillows in front of you and rest your affected arm on them while you sleep. If the swelling doesn't go down within 4 to 6 weeks, call your surgeon or nurse.    Total Mastectomy, Care After This sheet gives you information about how to care for yourself after your procedure. Your health care provider may also give you more specific instructions. If you have problems or questions, contact your  health care provider. What can I expect after the procedure? After the procedure, it is common to have:  Pain.  Numbness.  Stiffness in the arm or shoulder.  Feelings of stress, sadness, or depression. If the lymph nodes under your arm were removed, you may have arm swelling, weakness, or numbness on the same side of your body as your surgery. Follow these instructions at home: Incision care   Follow instructions from your health care provider about how to take care of your incision. Make sure you: ? Wash your hands with soap and water before you change your bandage (dressing). If soap and water are not available, use hand sanitizer. ? Change your dressing as told by your health care provider. ? Leave stitches (sutures), skin glue, or adhesive strips in place. These skin closures may need to stay in place for 2 weeks or longer. If adhesive strip edges start to loosen and curl up, you may trim the loose edges. Do not remove adhesive strips completely unless your  health care provider tells you to do that.  Check your incision area every day for signs of infection. Check for: ? Redness, swelling, or more pain. ? Fluid or blood. ? Warmth. ? Pus or a bad smell.  If you were sent home with a surgical drain in place, follow instructions from your health care provider about emptying it. Bathing Do not take baths, swim, or use a hot tub until your health care provider approves.  You may shower but keep the JP drain covered with plastic and tape. Activity  Return to your normal activities as told by your health care provider. Ask your health care provider what activities are safe for you.  Avoid activities that take a lot of effort.  Be careful to avoid any activities that could cause an injury to your arm on the side of your surgery.  Do not lift anything that is heavier than 10 lb (4.5 kg), or the limit that you are told, until your health care provider says that it is safe.  Avoid  lifting with the arm on the side of your surgery.  Do not carry heavy objects on your shoulder.  After your drain is removed, do exercises to prevent stiffness and swelling in your arm. Talk with your health care provider about which exercises are safe for you. General instructions  Take over-the-counter and prescription medicines only as told by your health care provider.  You may eat what you usually do.  Keep your arm raised (elevated) above the level of your heart when you are sitting or lying down.  Do not wear tight jewelry on your arm, wrist, or fingers on the side of your surgery.  You may be given a tight sleeve (compression bandage) to wear over your arm on the side of your surgery. Wear this sleeve as told by your health care provider.  Ask your health care provider when you can start wearing a bra or using a breast prosthesis.  Before you are involved in certain procedures such as giving blood or having your blood pressure checked, tell all your health care providers if lymph nodes under your arm were removed. This is important information. Follow-up  Keep all follow-up visits as told by your health care provider. This is important.  Get checked for extra fluid around your lymph nodes (lymphedema) as often as told by your health care provider. Contact a health care provider if:  You have a fever.  Your pain medicine is not working.  Your arm swelling, weakness, or numbness has not improved after a few weeks.  You have new swelling in your breast area or arm.  You have redness, swelling, or more pain in your incision area.  You have fluid or blood coming from your incision.  Your incision feels warm to the touch.  You have pus or a bad smell coming from your incision. Get help right away if:  You have very bad pain in your breast area or arm. [facility] Care Surgical drains are used to remove extra fluid that normally builds up in a surgical wound  after surgery. A surgical drain helps to heal a surgical wound. Different kinds of surgical drains include: Active drains. These drains use suction to pull drainage away from the surgical wound. Drainage flows through a tube to a container outside of the body. With these drains, you need to keep the bulb or the drainage container flat (compressed) at all times, except while you empty it.  Flattening the bulb or container creates suction. Passive drains. These drains allow fluid to drain naturally, by gravity. Drainage flows through a tube to a bandage (dressing) or a container outside of the body. Passive drains do not need to be emptied. A drain is placed during surgery. Right after surgery, drainage is usually bright red and a little thicker than water. The drainage may gradually turn yellow or pink and become thinner. It is likely that your health care provider will remove the drain when the drainage stops or when the amount decreases to 1-2 Tbsp (15-30 mL) during a 24-hour period. Supplies needed: Tape. Germ-free cleaning solution (sterile saline). Cotton swabs. Split gauze drain sponge: 4 x 4 inches (10 x 10 cm). Gauze square: 4 x 4 inches (10 x 10 cm). How to care for your surgical drain Care for your drain as told by your health care provider. This is important to help prevent infection. If your drain is placed at your back, or any other hard-to-reach area, ask another person to assist you in performing the following tasks: General care Keep the skin around the drain dry and covered with a dressing at all times. Check your drain area every day for signs of infection. Check for: Redness, swelling, or pain. Pus or a bad smell. Cloudy drainage. Tenderness or pressure at the drain exit site. Changing the dressing Follow instructions from your health care provider about how to change your dressing. Change your dressing at least once a day. Change it more often if needed to keep the dressing dry.  Make sure you: Gather your supplies. Wash your hands with soap and water before you change your dressing. If soap and water are not available, use hand sanitizer. Remove the old dressing. Avoid using scissors to do that. Wash your hands with soap and water again after removing the old dressing. Use sterile saline to clean your skin around the drain. You may need to use a cotton swab to clean the skin. Place the tube through the slit in a drain sponge. Place the drain sponge so that it covers your wound. Place the gauze square or another drain sponge on top of the drain sponge that is on the wound. Make sure the tube is between those layers. Tape the dressing to your skin. Tape the drainage tube to your skin 1-2 inches (2.5-5 cm) below the place where the tube enters your body. Taping keeps the tube from pulling on any stitches (sutures) that you have. Wash your hands with soap and water. Write down the color of your drainage and how often you change your dressing. How to empty your active drain  Make sure that you have a measuring cup that you can empty your drainage into. Wash your hands with soap and water. If soap and water are not available, use hand sanitizer. Loosen any pins or clips that hold the tube in place. If your health care provider tells you to strip the tube to prevent clots and tube blockages: Hold the tube at the skin with one hand. Use your other hand to pinch the tubing with your thumb and first finger. Gently move your fingers down the tube while squeezing very lightly. This clears any drainage, clots, or tissue from the tube. You may need to do this several times each day to keep the tube clear. Do not pull on the tube. Open the bulb cap or the drain plug. Do not touch the inside of the cap or the bottom of  the plug. Turn the device upside down and gently squeeze. Empty all of the drainage into the measuring cup. Compress the bulb or the container and replace the cap or  the plug. To compress the bulb or the container, squeeze it firmly in the middle while you close the cap or plug the container. Write down the amount of drainage that you have in each 24-hour period. If you have less than 2 Tbsp (30 mL) of drainage during 24 hours, contact your health care provider. Flush the drainage down the toilet. Wash your hands with soap and water. Contact a health care provider if: You have redness, swelling, or pain around your drain area. You have pus or a bad smell coming from your drain area. You have a fever or chills. The skin around your drain is warm to the touch. The amount of drainage that you have is increasing instead of decreasing. You have drainage that is cloudy. There is a sudden stop or a sudden decrease in the amount of drainage that you have. Your drain tube falls out. Your active drain does not stay compressed after you empty it. Summary Surgical drains are used to remove extra fluid that normally builds up in a surgical wound after surgery. Different kinds of surgical drains include active drains and passive drains. Active drains use suction to pull drainage away from the surgical wound, and passive drains allow fluid to drain naturally. It is important to care for your drain to prevent infection. If your drain is placed at your back, or any other hard-to-reach area, ask another person to assist you. Contact your health care provider if you have redness, swelling, or pain around your drain area. This information is not intended to replace advice given to you by your health care provider. Make sure you discuss any questions you have with your health care provider. Document Revised: 07/04/2018 Document Reviewed: 07/04/2018 Elsevier Patient Education  2020 [employer].  ou have chest pain.  You have difficulty breathing. Summary  Follow instructions from your health care provider about how to take care of your incision. Check your incision area  every day for signs of infection.  Ask your health care provider what activities are safe for you.  Keep all follow-up visits as told by your health care provider. This is important.  Make sure you know which symptoms should cause you to contact your health care provider or to get help right away. This information is not intended to replace advice given to you by your health care provider. Make sure you discuss any questions you have with your health care provider. Document Revised: 08/03/2018 Document Reviewed: 03/03/2017 Elsevier Patient Education  2020 [employer].

## 2020-05-16 NOTE — Progress Notes (Signed)
Discharge instructions provided to patient. Patient verbalized understanding of discharge instructions. Reviewed signs of infection. Provided patient with dressing supplies per Dr. Arnoldo Morale order.

## 2020-05-16 NOTE — Plan of Care (Signed)

## 2020-05-16 NOTE — Progress Notes (Signed)
Patient stable upon discharge patient alert and oriented x4. Patients discharge paper work given and reviewed with patient by SWOT Nurse Arita Miss). IV removed and patient wheeled down stairs by Arita Miss RN.

## 2020-05-16 NOTE — Discharge Summary (Signed)
Physician Discharge Summary  Patient ID: Christine Sutton MRN: [id number] DOB/AGE: 01-30-1930 86 y.o.  Admit date: 05/15/2020 Discharge date: 05/16/2020  Admission Diagnoses: Right breast cancer  Discharge Diagnoses: Same Principal Problem:   Malignant neoplasm of upper-outer quadrant of right breast in female, estrogen receptor negative ([place]) Active Problems:   Breast cancer [facility])   Discharged Condition: good  Hospital Course: Patient is an 86 year old white female with a malignant neoplasm of the upper, outer quadrant of the right breast, triple negative, who underwent a right total mastectomy with sentinel lymph node biopsy on 05/15/2020.  She tolerated surgery well.  Her postoperative course has been unremarkable.  Her diet was advanced out difficulty.  The patient is being discharged home on postoperative day 1 in good and improving condition.  Treatments: surgery: Right total mastectomy with sentinel lymph node biopsy on 05/15/2020  Discharge Exam: Blood pressure (!) 119/56, pulse 86, temperature 98.1 F (36.7 C), resp. rate 18, height 5' 4.49" (1.638 m), weight 64.9 kg, SpO2 97 %. General appearance: alert, cooperative and no distress Resp: clear to auscultation bilaterally Breasts: Right mastectomy incision healing well without hematoma or ecchymosis.  JP drainage serosanguineous in nature. Cardio: regular rate and rhythm, S1, S2 normal, no murmur, click, rub or gallop  Disposition: Discharge disposition: 01-Home or Self Care       Discharge Instructions    Diet - low sodium heart healthy   Complete by: As directed    Increase activity slowly   Complete by: As directed      Allergies as of 05/16/2020      Reactions   Codeine Nausea And Vomiting      Medication List    TAKE these medications   acetaminophen 500 MG tablet Commonly known as: TYLENOL Take 500 mg by mouth every 6 (six) hours as needed for moderate pain or headache.   amLODipine 5 MG  tablet Commonly known as: NORVASC Take 5 mg by mouth at bedtime.   aspirin 81 MG chewable tablet Chew 81 mg by mouth at bedtime.   CENTRUM SILVER PO Take 1 capsule by mouth daily.   diphenhydramine-acetaminophen 25-500 MG Tabs tablet Commonly known as: TYLENOL PM Take 1 tablet by mouth at bedtime.   gabapentin 300 MG capsule Commonly known as: NEURONTIN Take 300 mg by mouth 3 (three) times daily as needed (pain).   lisinopril-hydrochlorothiazide 20-25 MG tablet Commonly known as: ZESTORETIC Take 1 tablet by mouth daily.   metFORMIN 500 MG 24 hr tablet Commonly known as: GLUCOPHAGE-XR Take 500 mg by mouth 2 (two) times daily.   nystatin-triamcinolone ointment Commonly known as: MYCOLOG Apply 1 application topically 2 (two) times daily.   polyethylene glycol 17 g packet Commonly known as: MIRALAX / GLYCOLAX Take 17 g by mouth at bedtime.   SYSTANE OP Place 1 drop into both eyes daily as needed (dry eyes).       Follow-up Information    Virl Cagey, MD On 05/21/2020.   Specialty: General Surgery Why: For wound re-check, drain and wound check  Contact information: [address] Linna Hoff [facility] [address] [phone]               Signed: Aviva Signs 05/16/2020, 9:26 AM

## 2020-05-19 ENCOUNTER — Encounter (HOSPITAL_COMMUNITY): Payer: Self-pay | Admitting: General Surgery

## 2020-05-19 LAB — SURGICAL PATHOLOGY

## 2020-05-21 ENCOUNTER — Encounter: Payer: Self-pay | Admitting: General Surgery

## 2020-05-21 ENCOUNTER — Ambulatory Visit (INDEPENDENT_AMBULATORY_CARE_PROVIDER_SITE_OTHER): Payer: Medicare Other | Admitting: General Surgery

## 2020-05-21 ENCOUNTER — Other Ambulatory Visit: Payer: Self-pay

## 2020-05-21 VITALS — BP 154/69 | HR 78 | Temp 98.1°F | Resp 12 | Ht 64.5 in | Wt 140.0 lb

## 2020-05-21 DIAGNOSIS — C50411 Malignant neoplasm of upper-outer quadrant of right female breast: Secondary | ICD-10-CM

## 2020-05-21 DIAGNOSIS — Z171 Estrogen receptor negative status [ER-]: Secondary | ICD-10-CM

## 2020-05-21 NOTE — Progress Notes (Signed)
[facility] Note   HPI:  86 y.o. Female presents to clinic for post-op follow-up evaluation of her right breast mastectomy. She is doing well. She has some swelling and bruising. She says she had 30-35 cc SS output from drain daily.   Review of Systems:  No fevers or chills Pain well controlled  All other review of systems: otherwise negative   Vital Signs:  BP (!) 154/69   Pulse 78   Temp 98.1 F (36.7 C) (Oral)   Resp 12   Ht 5' 4.5" (1.638 m)   Wt 140 lb (63.5 kg)   SpO2 94%   BMI 23.66 kg/m    Physical Exam:  Physical Exam Vitals reviewed.  Cardiovascular:     Rate and Rhythm: Normal rate.  Pulmonary:     Effort: Pulmonary effort is normal.  Chest:     Comments: Right mastectomy site with some staples, no erythema or drainage, swelling and bruising in the inferior flap, drain removed, axilla incision c/d/i with dermabond, some swelling in area, appropriately tender Neurological:     Mental Status: She is alert.    Pathology: FINAL MICROSCOPIC DIAGNOSIS:   A. BREAST, RIGHT, MASTECTOMY:  - Invasive lobular carcinoma, multifocal, 1.3 cm in greatest dimension,  [place] grade 2 of 3.  - Margins of resection are not involved.  - One intraparenchymal lymph node, negative for carcinoma (0/1).  - Biopsy site (X2).  - See oncology table.   B. SENTINEL LYMPH NODE, RIGHT AXILLARY, BIOPSY:  - One lymph node, negative for carcinoma (0/1).   C. SENTINEL LYMPH NODE, RIGHT AXILLARY, BIOPSY:  - One lymph node, negative for carcinoma (0/1).   D. SENTINEL LYMPH NODE, RIGHT AXILLARY, BIOPSY:  - One lymph node, negative for carcinoma (0/1).   Assessment:  86 y.o. yo Female with lobular carcinoma s/p mastectomy and sentinel node all negative for cancer. Doing well.  Plan:  - Drain removed   - Will get staples out next week given the swelling  - Referral back to Dr. Delton Coombes  Future Appointments  Date Time Provider [place]  05/28/2020  11:15 AM Virl Cagey, MD [facility] None     All of the above recommendations were discussed with the patient and patient's family, and all of patient's and family's questions were answered to their expressed satisfaction.  Curlene Labrum, MD [facility] [address] [place], [place] [address] [phone] (office)

## 2020-05-28 ENCOUNTER — Ambulatory Visit (INDEPENDENT_AMBULATORY_CARE_PROVIDER_SITE_OTHER): Payer: Medicare Other | Admitting: General Surgery

## 2020-05-28 ENCOUNTER — Other Ambulatory Visit: Payer: Self-pay

## 2020-05-28 ENCOUNTER — Encounter: Payer: Self-pay | Admitting: General Surgery

## 2020-05-28 VITALS — BP 136/75 | HR 72 | Temp 98.3°F | Resp 14 | Ht 64.5 in | Wt 139.0 lb

## 2020-05-28 DIAGNOSIS — C50411 Malignant neoplasm of upper-outer quadrant of right female breast: Secondary | ICD-10-CM

## 2020-05-28 DIAGNOSIS — Z171 Estrogen receptor negative status [ER-]: Secondary | ICD-10-CM

## 2020-05-28 MED ORDER — HYDROCODONE-ACETAMINOPHEN 5-325 MG PO TABS: 1.0000 | ORAL_TABLET | Freq: Four times a day (QID) | ORAL | 0 refills | Status: DC | PRN

## 2020-05-28 NOTE — Progress Notes (Signed)
[facility] Note   HPI:  86 y.o. Female presents to clinic for post-op follow-up evaluation after right mastectomy and sentinel node. She is feeling swollen and sore still. No drainage.   Review of Systems:  No redness Swelling  All other review of systems: otherwise negative   Vital Signs:  BP 136/75   Pulse 72   Temp 98.3 F (36.8 C) (Oral)   Resp 14   Ht 5' 4.5" (1.638 m)   Wt 139 lb (63 kg)   SpO2 94%   BMI 23.49 kg/m    Physical Exam:  Physical Exam Cardiovascular:     Rate and Rhythm: Normal rate.  Pulmonary:     Effort: Pulmonary effort is normal.  Chest:     Comments: Right mastectomy incision c/d/i and axillary incision c/d/i and staples removed, steri strips placed, no erythema or drainage, swelling and yellow bruising laterally Neurological:     Mental Status: She is alert.      Assessment:  86 y.o. yo Female with right breast cancer s/p right mastectomy and sentinel node with negative nodes. Doing well but has some post operative swelling still. Needs so pain meds occasionally.  Plan:  Steri strips will peel up after about 5-7 days. Ok to shower. Continue exercises. Swelling should continue to improve.  Norco 5-325 q 6 PRN #10 refilled  Will check on in a few weeks to ensure pain and swelling improving   Future Appointments  Date Time Provider [place]  06/09/2020 10:15 AM Virl Cagey, MD [facility] None  06/16/2020 10:45 AM Derek Jack, MD [facility] None    All of the above recommendations were discussed with the patient and patient's family, and all of patient's and family's questions were answered to their expressed satisfaction.  Curlene Labrum, MD [facility] [address] [place], [place] [address] [phone] (office)

## 2020-05-28 NOTE — Patient Instructions (Signed)
Steri strips will peel up after about 5-7 days. Ok to shower. Continue exercises. Swelling should continue to improve.

## 2020-06-09 ENCOUNTER — Ambulatory Visit (INDEPENDENT_AMBULATORY_CARE_PROVIDER_SITE_OTHER): Payer: Medicare Other | Admitting: General Surgery

## 2020-06-09 ENCOUNTER — Other Ambulatory Visit: Payer: Self-pay

## 2020-06-09 ENCOUNTER — Encounter: Payer: Self-pay | Admitting: General Surgery

## 2020-06-09 VITALS — BP 129/72 | HR 85 | Temp 98.2°F | Resp 14 | Ht 64.5 in | Wt 139.0 lb

## 2020-06-09 DIAGNOSIS — Z171 Estrogen receptor negative status [ER-]: Secondary | ICD-10-CM

## 2020-06-09 DIAGNOSIS — C50411 Malignant neoplasm of upper-outer quadrant of right female breast: Secondary | ICD-10-CM

## 2020-06-09 NOTE — Patient Instructions (Signed)
Swelling and tenderness wil continue to improve. Remove strips in shower. Talk to Dr. Ellin Saba about any further treatment.

## 2020-06-09 NOTE — Progress Notes (Signed)
[facility] Note   HPI:  86 y.o. Female presents to clinic for post-op follow-up evaluation of her right breast mastectomy. Patient reports less swelling and pain in the right axilla.  Review of Systems:  Improving soreness Improving swelling in axilla  All other review of systems: otherwise negative   Vital Signs:  BP 129/72   Pulse 85   Temp 98.2 F (36.8 C) (Oral)   Resp 14   Ht 5' 4.5" (1.638 m)   Wt 139 lb (63 kg)   SpO2 94%   BMI 23.49 kg/m    Physical Exam:  Physical Exam Vitals reviewed.  Cardiovascular:     Rate and Rhythm: Normal rate.  Chest:     Comments: Right mastectomy site with less swelling, steri strips peeling, right axilla with less swelling and no erythema, dermabond peeling  Neurological:     Mental Status: She is alert.      Assessment:  86 y.o. yo Female with triple negative breast cancer, node negative. Doing well overall. She sees Dr. Ellin Saba in a few weeks. Told her he would discuss any more treatment options. Discussed if she needs a port we can place in the upcoming weeks.   Plan:  Swelling and tenderness wil continue to improve. Remove strips in shower. Talk to Dr. Ellin Saba about any further treatment. PRN Follow up  All of the above recommendations were discussed with the patient and patient's family, and all of patient's and family's questions were answered to their expressed satisfaction.  Algis Greenhouse, MD [facility] [address] [place], [place] [address] [phone] (office)

## 2020-06-16 ENCOUNTER — Inpatient Hospital Stay (HOSPITAL_COMMUNITY): Payer: Medicare Other

## 2020-06-16 ENCOUNTER — Other Ambulatory Visit: Payer: Self-pay

## 2020-06-16 ENCOUNTER — Inpatient Hospital Stay (HOSPITAL_COMMUNITY): Payer: Medicare Other | Attending: Hematology | Admitting: Hematology

## 2020-06-16 VITALS — BP 151/64 | HR 81 | Temp 97.0°F | Resp 18 | Wt 138.5 lb

## 2020-06-16 DIAGNOSIS — C50911 Malignant neoplasm of unspecified site of right female breast: Secondary | ICD-10-CM | POA: Diagnosis not present

## 2020-06-16 DIAGNOSIS — Z800 Family history of malignant neoplasm of digestive organs: Secondary | ICD-10-CM | POA: Diagnosis not present

## 2020-06-16 DIAGNOSIS — K862 Cyst of pancreas: Secondary | ICD-10-CM | POA: Diagnosis not present

## 2020-06-16 DIAGNOSIS — Z7984 Long term (current) use of oral hypoglycemic drugs: Secondary | ICD-10-CM | POA: Insufficient documentation

## 2020-06-16 DIAGNOSIS — Z171 Estrogen receptor negative status [ER-]: Secondary | ICD-10-CM | POA: Diagnosis not present

## 2020-06-16 DIAGNOSIS — Z801 Family history of malignant neoplasm of trachea, bronchus and lung: Secondary | ICD-10-CM | POA: Insufficient documentation

## 2020-06-16 DIAGNOSIS — Z7982 Long term (current) use of aspirin: Secondary | ICD-10-CM | POA: Insufficient documentation

## 2020-06-16 DIAGNOSIS — I10 Essential (primary) hypertension: Secondary | ICD-10-CM | POA: Diagnosis not present

## 2020-06-16 DIAGNOSIS — C50912 Malignant neoplasm of unspecified site of left female breast: Secondary | ICD-10-CM | POA: Diagnosis not present

## 2020-06-16 DIAGNOSIS — M549 Dorsalgia, unspecified: Secondary | ICD-10-CM | POA: Insufficient documentation

## 2020-06-16 DIAGNOSIS — Z853 Personal history of malignant neoplasm of breast: Secondary | ICD-10-CM | POA: Insufficient documentation

## 2020-06-16 DIAGNOSIS — E119 Type 2 diabetes mellitus without complications: Secondary | ICD-10-CM | POA: Insufficient documentation

## 2020-06-16 DIAGNOSIS — Z803 Family history of malignant neoplasm of breast: Secondary | ICD-10-CM | POA: Diagnosis not present

## 2020-06-16 DIAGNOSIS — C50411 Malignant neoplasm of upper-outer quadrant of right female breast: Secondary | ICD-10-CM | POA: Diagnosis not present

## 2020-06-16 DIAGNOSIS — E7800 Pure hypercholesterolemia, unspecified: Secondary | ICD-10-CM | POA: Insufficient documentation

## 2020-06-16 DIAGNOSIS — Z79899 Other long term (current) drug therapy: Secondary | ICD-10-CM | POA: Insufficient documentation

## 2020-06-16 DIAGNOSIS — Z9013 Acquired absence of bilateral breasts and nipples: Secondary | ICD-10-CM | POA: Diagnosis not present

## 2020-06-16 LAB — CBC WITH DIFFERENTIAL/PLATELET
Abs Immature Granulocytes: 0.04 10*3/uL (ref 0.00–0.07)
Basophils Absolute: 0 10*3/uL (ref 0.0–0.1)
Basophils Relative: 0 %
Eosinophils Absolute: 0.1 10*3/uL (ref 0.0–0.5)
Eosinophils Relative: 2 %
HCT: 42 % (ref 36.0–46.0)
Hemoglobin: 13.7 g/dL (ref 12.0–15.0)
Immature Granulocytes: 1 %
Lymphocytes Relative: 22 %
Lymphs Abs: 1.8 10*3/uL (ref 0.7–4.0)
MCH: 29.6 pg (ref 26.0–34.0)
MCHC: 32.6 g/dL (ref 30.0–36.0)
MCV: 90.7 fL (ref 80.0–100.0)
Monocytes Absolute: 0.6 10*3/uL (ref 0.1–1.0)
Monocytes Relative: 7 %
Neutro Abs: 5.8 10*3/uL (ref 1.7–7.7)
Neutrophils Relative %: 68 %
Platelets: 324 10*3/uL (ref 150–400)
RBC: 4.63 MIL/uL (ref 3.87–5.11)
RDW: 12.7 % (ref 11.5–15.5)
WBC: 8.3 10*3/uL (ref 4.0–10.5)
nRBC: 0 % (ref 0.0–0.2)

## 2020-06-16 LAB — COMPREHENSIVE METABOLIC PANEL
ALT: 17 U/L (ref 0–44)
AST: 17 U/L (ref 15–41)
Albumin: 4.3 g/dL (ref 3.5–5.0)
Alkaline Phosphatase: 84 U/L (ref 38–126)
Anion gap: 9 (ref 5–15)
BUN: 21 mg/dL (ref 8–23)
CO2: 25 mmol/L (ref 22–32)
Calcium: 10.1 mg/dL (ref 8.9–10.3)
Chloride: 100 mmol/L (ref 98–111)
Creatinine, Ser: 0.72 mg/dL (ref 0.44–1.00)
GFR, Estimated: >60 mL/min (ref >60)
Glucose, Bld: 106 mg/dL — ABNORMAL HIGH (ref 70–99)
Potassium: 4.1 mmol/L (ref 3.5–5.1)
Sodium: 134 mmol/L — ABNORMAL LOW (ref 135–145)
Total Bilirubin: 0.5 mg/dL (ref 0.3–1.2)
Total Protein: 7.3 g/dL (ref 6.5–8.1)

## 2020-06-16 NOTE — Patient Instructions (Signed)
[facility] at [facility] Discharge Instructions  You were seen today by Dr. Ellin Saba. He went over your recent results: your biopsy results showed an invasive lobular carcinoma of your right breast which is triple negative. You had additional blood work drawn today. You will be scheduled for a CT scan of your chest and abdomen and a bone scan. You will be referred to a geneticist for further analysis of your genes. Dr. Ellin Saba will see you back in 3 weeks for follow up.   Thank you for choosing [facility] at [facility] to provide your oncology and hematology care.  To afford each patient quality time with our provider, please arrive at least 15 minutes before your scheduled appointment time.   If you have a lab appointment with the Cancer Center please come in thru the Main Entrance and check in at the main information desk  You need to re-schedule your appointment should you arrive 10 or more minutes late.  We strive to give you quality time with our providers, and arriving late affects you and other patients whose appointments are after yours.  Also, if you no show three or more times for appointments you may be dismissed from the clinic at the providers discretion.     Again, thank you for choosing [facility].  Our hope is that these requests will decrease the amount of time that you wait before being seen by our physicians.       _____________________________________________________________  Should you have questions after your visit to [facility], please contact our office at [phone] between the hours of 8:00 a.m. and 4:30 p.m.  Voicemails left after 4:00 p.m. will not be returned until the following business day.  For prescription refill requests, have your pharmacy contact our office and allow 72 hours.    Cancer Center Support Programs:   > Cancer Support Group  2nd Tuesday of the month 1pm-2pm, Journey  Room

## 2020-06-16 NOTE — Progress Notes (Signed)
[facility] [address], [place] [address]   Patient Care Team: Asencion Noble, MD as PCP - General (Internal Medicine)  SUMMARY OF ONCOLOGIC HISTORY: Oncology History   No history exists.    CHIEF COMPLIANT: Follow-up for left and right breast cancer   INTERVAL HISTORY: [name] is a 87 y.o. female here today for follow up of her left and right breast cancer. Her last visit was on 04/11/2018.   Today she is accompanied by her son. She reports that she is still sore from her right mastectomy that she had on 05/15/2020 with Dr. Constance Haw, though it is healing nicely. She reports that his mass was discovered on mammogram. She reports that she did not an anti-estrogen for her left breast cancer.  Her mother and sister had breast cancer, along with colon cancer in mother; her father had lung cancer.   REVIEW OF SYSTEMS:   Review of Systems  All other systems reviewed and are negative.   I have reviewed the past medical history, past surgical history, social history and family history with the patient and they are unchanged from previous note.   ALLERGIES:   is allergic to codeine.   MEDICATIONS:  Current Outpatient Medications  Medication Sig Dispense Refill  . acetaminophen (TYLENOL) 500 MG tablet Take 500 mg by mouth every 6 (six) hours as needed for moderate pain or headache.  (Patient not taking: Reported on 06/16/2020)    . amLODipine (NORVASC) 5 MG tablet Take 5 mg by mouth at bedtime.     Marland Kitchen aspirin 81 MG chewable tablet Chew 81 mg by mouth at bedtime.    . diphenhydramine-acetaminophen (TYLENOL PM) 25-500 MG TABS Take 1 tablet by mouth at bedtime.     . gabapentin (NEURONTIN) 300 MG capsule Take 300 mg by mouth 3 (three) times daily as needed (pain).     Marland Kitchen HYDROcodone-acetaminophen (NORCO) 5-325 MG tablet Take 1 tablet by mouth every 6 (six) hours as needed for severe pain. (Patient not taking: Reported on 06/16/2020) 10 tablet 0  .  lisinopril-hydrochlorothiazide (PRINZIDE,ZESTORETIC) 20-25 MG per tablet Take 1 tablet by mouth daily.     . metFORMIN (GLUCOPHAGE-XR) 500 MG 24 hr tablet Take 500 mg by mouth 2 (two) times daily.     . Multiple Vitamins-Minerals (CENTRUM SILVER PO) Take 1 capsule by mouth daily.    . nitrofurantoin, macrocrystal-monohydrate, (MACROBID) 100 MG capsule Take 100 mg by mouth daily.    Vladimir Faster Glycol-Propyl Glycol (SYSTANE OP) Place 1 drop into both eyes daily as needed (dry eyes). (Patient not taking: Reported on 06/16/2020)    . polyethylene glycol (MIRALAX / GLYCOLAX) packet Take 17 g by mouth at bedtime.      No current facility-administered medications for this visit.     PHYSICAL EXAMINATION: Performance status (ECOG): 1 - Symptomatic but completely ambulatory  Vitals:   06/16/20 1055  BP: (!) 151/64  Pulse: 81  Resp: 18  Temp: (!) 97 F (36.1 C)  SpO2: 97%   Wt Readings from Last 3 Encounters:  06/16/20 138 lb 8 oz (62.8 kg)  06/09/20 139 lb (63 kg)  05/28/20 139 lb (63 kg)   Physical Exam Vitals reviewed.  Constitutional:      Appearance: Normal appearance.  Cardiovascular:     Rate and Rhythm: Normal rate and regular rhythm.     Pulses: Normal pulses.     Heart sounds: Normal heart sounds.  Pulmonary:  Effort: Pulmonary effort is normal.     Breath sounds: Normal breath sounds.  Chest:  Breasts:     Right: Absent. No swelling, mass, skin change or tenderness.     Left: Absent. No swelling, mass, skin change or tenderness.    Abdominal:     Palpations: Abdomen is soft. There is no mass.     Tenderness: There is no abdominal tenderness.  Neurological:     General: No focal deficit present.     Mental Status: She is alert and oriented to person, place, and time.  Psychiatric:        Mood and Affect: Mood normal.        Behavior: Behavior normal.     Breast Exam Chaperone: Milinda Antis, MD     LABORATORY DATA:  I have reviewed the data as  listed CMP Latest Ref Rng & Units 05/13/2020 06/11/2009  Glucose 70 - 99 mg/dL 116(H) 123(H)  BUN 8 - 23 mg/dL 18 19  Creatinine 0.44 - 1.00 mg/dL 0.66 0.72  Sodium 135 - 145 mmol/L 133(L) 139  Potassium 3.5 - 5.1 mmol/L 3.8 4.3  Chloride 98 - 111 mmol/L 97(L) 99  CO2 22 - 32 mmol/L 26 33(H)  Calcium 8.9 - 10.3 mg/dL 10.3 10.7(H)   No results found for: KVQ259 Lab Results  Component Value Date   WBC 6.1 05/13/2020   HGB 13.5 05/13/2020   HCT 42.0 05/13/2020   MCV 91.9 05/13/2020   PLT 332 05/13/2020   NEUTROABS 3.9 05/13/2020   Surgical pathology ([id number]) on 05/15/2020: Right breast mastectomy: invasive lobular carcinoma, 1.3 cm in greatest dimension.  ASSESSMENT:  1.  Right breast TNBC: -Right mastectomy on 05/15/2020 -Invasive lobular carcinoma, multifocal, 1.3 cm in greatest dimension, grade 2, margins negative, 0/4 lymph nodes involved, Ki-67: 50%, ER/PR negative, HER-2 IHC 1+, FISH negative, PT 1 CPN 0  2.  Stage I left breast cancer ER/PR positive and HER-2 negative: - She had mastectomy in 2012. - Mammogram reviewed by me on 03/28/2018 was BI-RADS Category 1 of the right breast. -She apparently did not take any antiestrogen therapy.  3.  Family history: -Mother had colon cancer.  Mother and sister had breast cancer. -Father had lung cancer.  PLAN:  1.  T1CN0 invasive lobular carcinoma right breast, ER/PR/HER-2 negative: -We have reviewed pathology report in detail. -Recommend staging scans including CT CAP and bone scan. -Check tumor markers. -We talked about standard treatment of 4 cycles of docetaxel and cyclophosphamide.  However given her advanced age, we are reluctant to consider any adjuvant therapy.  If the scans and tumor markers are within normal limits, we will continue close follow-ups. -We will see her back after the scans.  2.  Family history: -Due to personal history of breast cancer x2 and family history, I have recommended genetic  testing.   Breast Cancer therapy associated bone loss: I have recommended calcium, Vitamin D and weight bearing exercises.   Orders Placed This Encounter  Procedures  . CT CHEST ABDOMEN PELVIS W CONTRAST    Standing Status:   Future    Standing Expiration Date:   06/16/2021    Order Specific Question:   Preferred imaging location?    Answer:   [facility]    Order Specific Question:   Release to patient    Answer:   Immediate    Order Specific Question:   Radiology Contrast Protocol - do NOT remove file path    Answer:   [email or web address]  .  NM Bone Scan Whole Body    Standing Status:   Future    Standing Expiration Date:   06/16/2021    Order Specific Question:   If indicated for the ordered procedure, I authorize the administration of a radiopharmaceutical per Radiology protocol    Answer:   Yes    Order Specific Question:   Preferred imaging location?    Answer:   [facility]    Order Specific Question:   Release to patient    Answer:   Immediate  . CBC with Differential    Standing Status:   Standing    Number of Occurrences:   20    Standing Expiration Date:   06/16/2021  . Comprehensive metabolic panel    Standing Status:   Standing    Number of Occurrences:   20    Standing Expiration Date:   06/16/2021  . Cancer antigen 15-3    Standing Status:   Standing    Number of Occurrences:   20    Standing Expiration Date:   06/16/2021  . Cancer antigen 27.29    Standing Status:   Standing    Number of Occurrences:   20    Standing Expiration Date:   06/16/2021  . Ambulatory referral to Genetics    Referral Priority:   Routine    Referral Type:   Consultation    Referral Reason:   Specialty Services Required    Number of Visits Requested:   1   The patient has a good understanding of the overall plan. she agrees with it. she will call with any problems that may develop before the next visit here.    Derek Jack,  MD [place] [phone]   I, Milinda Antis, am acting as a scribe for Dr. Sanda Linger.  I, Derek Jack MD, have reviewed the above documentation for accuracy and completeness, and I agree with the above.

## 2020-06-17 LAB — CANCER ANTIGEN 15-3: CA 15-3: 30.7 U/mL — ABNORMAL HIGH (ref 0.0–25.0)

## 2020-06-17 LAB — CANCER ANTIGEN 27.29: CA 27.29: 37.6 U/mL (ref 0.0–38.6)

## 2020-06-22 ENCOUNTER — Other Ambulatory Visit: Payer: Self-pay

## 2020-06-22 ENCOUNTER — Encounter (HOSPITAL_COMMUNITY)
Admission: RE | Admit: 2020-06-22 | Discharge: 2020-06-22 | Disposition: A | Discharge status: Home / Self Care | Payer: Medicare Other | Source: Ambulatory Visit | Attending: Hematology | Admitting: Hematology

## 2020-06-22 DIAGNOSIS — M160 Bilateral primary osteoarthritis of hip: Secondary | ICD-10-CM | POA: Diagnosis not present

## 2020-06-22 DIAGNOSIS — C50911 Malignant neoplasm of unspecified site of right female breast: Secondary | ICD-10-CM | POA: Insufficient documentation

## 2020-06-22 DIAGNOSIS — C50912 Malignant neoplasm of unspecified site of left female breast: Secondary | ICD-10-CM | POA: Diagnosis not present

## 2020-06-22 DIAGNOSIS — M19011 Primary osteoarthritis, right shoulder: Secondary | ICD-10-CM | POA: Diagnosis not present

## 2020-06-22 DIAGNOSIS — I10 Essential (primary) hypertension: Secondary | ICD-10-CM | POA: Diagnosis not present

## 2020-06-22 DIAGNOSIS — C50919 Malignant neoplasm of unspecified site of unspecified female breast: Secondary | ICD-10-CM | POA: Diagnosis not present

## 2020-06-22 MED ORDER — TECHNETIUM TC 99M MEDRONATE IV KIT
20.0000 | PACK | Freq: Once | INTRAVENOUS | Status: AC | PRN
  Administered 2020-06-22: 21.2 via INTRAVENOUS

## 2020-06-28 ENCOUNTER — Encounter: Payer: Self-pay | Admitting: Genetic Counselor

## 2020-06-28 DIAGNOSIS — Z1379 Encounter for other screening for genetic and chromosomal anomalies: Secondary | ICD-10-CM | POA: Insufficient documentation

## 2020-07-02 ENCOUNTER — Inpatient Hospital Stay: Payer: Medicare Other | Attending: Genetic Counselor | Admitting: Licensed Clinical Social Worker

## 2020-07-02 ENCOUNTER — Encounter: Payer: Self-pay | Admitting: Licensed Clinical Social Worker

## 2020-07-02 DIAGNOSIS — C50411 Malignant neoplasm of upper-outer quadrant of right female breast: Secondary | ICD-10-CM

## 2020-07-02 DIAGNOSIS — Z800 Family history of malignant neoplasm of digestive organs: Secondary | ICD-10-CM

## 2020-07-02 DIAGNOSIS — C50912 Malignant neoplasm of unspecified site of left female breast: Secondary | ICD-10-CM

## 2020-07-02 DIAGNOSIS — Z801 Family history of malignant neoplasm of trachea, bronchus and lung: Secondary | ICD-10-CM

## 2020-07-02 DIAGNOSIS — Z1379 Encounter for other screening for genetic and chromosomal anomalies: Secondary | ICD-10-CM | POA: Diagnosis not present

## 2020-07-02 DIAGNOSIS — Z803 Family history of malignant neoplasm of breast: Secondary | ICD-10-CM

## 2020-07-02 DIAGNOSIS — Z171 Estrogen receptor negative status [ER-]: Secondary | ICD-10-CM

## 2020-07-02 NOTE — Progress Notes (Signed)
REFERRING PROVIDER: Katragadda, Sreedhar, MD [address] [place],  [place] [address]  PRIMARY PROVIDER:  Fagan, Roy, MD  PRIMARY REASON FOR VISIT:  1. Malignant neoplasm of upper-outer quadrant of right breast in female, estrogen receptor negative (HCC)   2. Family history of breast cancer   3. Family history of colon cancer   4. Family history of lung cancer   5. Invasive ductal carcinoma of left breast (HCC)   6. Genetic testing     I connected with [name] and her son, Christine Sutton,  on 07/02/2020 at 11:00 AM EDT by MyChart video conference and verified that I am speaking with the correct person using two identifiers.    Patient location: home Provider location: [facility]   HISTORY OF PRESENT ILLNESS:   [name], a 87 y.o. female, was seen for a [place] cancer genetics consultation at the request of Dr. Katragadda due to a personal and family history of cancer.  [name] presents to clinic today to discuss the possibility of a hereditary predisposition to cancer, genetic testing, and to further clarify her future cancer risks, as well as potential cancer risks for family members.   In 2015, at the age of 83, [name] was diagnosed with invasive ductal carcinoma of the left breast. In 2021, at the age of 87, [name] was diagnosed with invasive lobular carcinoma of the right breast.This was treated with mastectomy.   CANCER HISTORY:  Oncology History  Invasive ductal carcinoma of left breast (HCC)  01/27/2014 Initial Diagnosis   Invasive ductal carcinoma of left breast (HCC)   06/26/2020 Genetic Testing   Negative genetic testing: no pathogenic variants detected in [facility] Common Hereditary Cancers Panel.  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  The report date is June 26, 2020.   The Common Hereditary Cancers Panel offered by [facility] includes sequencing and/or deletion duplication testing of the following 48 genes: APC,  ATM, AXIN2, BARD1, BMPR1A, BRCA1, BRCA2, BRIP1, CDH1, CDK4, CDKN2A (p14ARF), CDKN2A (p16INK4a), CHEK2, CTNNA1, DICER1, EPCAM (Deletion/duplication testing only), GREM1 (promoter region deletion/duplication testing only), KIT, MEN1, MLH1, MSH2, MSH3, MSH6, MUTYH, NBN, NF1, NHTL1, PALB2, PDGFRA, PMS2, POLD1, POLE, PTEN, RAD50, RAD51C, RAD51D, RNF43, SDHB, SDHC, SDHD, SMAD4, SMARCA4. STK11, TP53, TSC1, TSC2, and VHL.  The following genes were evaluated for sequence changes only: SDHA and HOXB13 c.251G>A variant only.     Past Medical History:  Diagnosis Date  . Breast disorder   . Diabetes mellitus (HCC)   . Family history of breast cancer   . Family history of colon cancer   . Family history of lung cancer   . High blood cholesterol level   . HX: breast cancer    left  . Hypertension   . Invasive ductal carcinoma of left breast (HCC) 01/27/2014   ER +    Past Surgical History:  Procedure Laterality Date  . BACK SURGERY     lower  . CATARACT EXTRACTION, BILATERAL  11/2014  . MASTECTOMY Left   . SENTINEL NODE BIOPSY Right 05/15/2020   Procedure: SENTINEL NODE BIOPSY;  Surgeon: Christine Sutton, Lindsay C, MD;  Location: [facility];  Service: General;  Laterality: Right;  . TOTAL MASTECTOMY Right 05/15/2020   Procedure: TOTAL MASTECTOMY RIGHT BREAST;  Surgeon: Christine Sutton, Lindsay C, MD;  Location: [facility];  Service: General;  Laterality: Right;    Social History   Socioeconomic History  . Marital status: Widowed    Spouse name: Not on file  .   Number of children: Not on file  . Years of education: Not on file  . Highest education level: Not on file  Occupational History  . Not on file  Tobacco Use  . Smoking status: Never Smoker  . Smokeless tobacco: Never Used  Vaping Use  . Vaping Use: Never used  Substance and Sexual Activity  . Alcohol use: No  . Drug use: No  . Sexual activity: Not Currently    Birth control/protection: Surgical    Comment: hyst  Other Topics Concern  . Not on file   Social History Narrative  . Not on file   Social Determinants of Health   Financial Resource Strain: Low Risk   . Difficulty of Paying Living Expenses: Not hard at all  Food Insecurity: No Food Insecurity  . Worried About Running Out of Food in the Last Year: Never true  . Ran Out of Food in the Last Year: Never true  Transportation Needs: No Transportation Needs  . Lack of Transportation (Medical): No  . Lack of Transportation (Non-Medical): No  Physical Activity: Insufficiently Active  . Days of Exercise per Week: 3 days  . Minutes of Exercise per Session: 20 min  Stress: No Stress Concern Present  . Feeling of Stress : Only a little  Social Connections: Moderately Isolated  . Frequency of Communication with Friends and Family: Once a week  . Frequency of Social Gatherings with Friends and Family: Once a week  . Attends Religious Services: More than 4 times per year  . Active Member of Clubs or Organizations: Yes  . Attends Club or Organization Meetings: More than 4 times per year  . Marital Status: Widowed     FAMILY HISTORY:  We obtained a detailed, 4-generation family history.  Significant diagnoses are listed below: Family History  Problem Relation Age of Onset  . Cancer Mother   . Breast cancer Mother   . Colon cancer Mother   . Cancer Sister   . Breast cancer Sister   . Cancer Sister   . Lung cancer Father    [name] has one son, Christine Sutton, age 68. She had 2 sisters and 1 brother. One sister had breast cancer at 48 and died at 54. Her other sister also had cancer, unknown type, possibly stomach/something abdominal.   [name]'s mother had breast and colon cancer in her 50s/60s, and died at 94. Patient had 3 maternal uncles and 2 maternal aunts, no known cancers for them or her cousins. Maternal grandparents passed at older ages.  [name]'s father had lung cancer and died at 78. She does not have information about paternal family history, is unaware of any  cancers.   [name] is unaware of previous family history of genetic testing for hereditary cancer risks.   GENETIC COUNSELING ASSESSMENT: [name] is a 87 y.o. female with a personal and family history which is somewhat suggestive of a hereditary cancer syndrome and predisposition to cancer. We, therefore, discussed and recommended the following at today's visit.   DISCUSSION: We discussed that approximately 5-10% of breast cancer is hereditary  Most cases of hereditary breast cancer are associated with BRCA1/BRCA2 genes, although there are other genes associated with hereditary breast cancer as well. We discussed that testing is beneficial for several reasons including knowing about other cancer risks, identifying potential screening and risk-reduction options that may be appropriate, and to understand if other family members could be at risk for cancer and allow them to undergo genetic testing.     We reviewed the characteristics, features and inheritance patterns of hereditary cancer syndromes. We also discussed genetic testing, including the appropriate family members to test, the process of testing, insurance coverage and turn-around-time for results. We discussed the implications of a negative, positive and/or variant of uncertain significant result. Ms. Roorda had testing for the [facility] Common Hereditary Cancers Panel.    GENETIC TEST RESULTS: Genetic testing reported out on 06/26/2020 through the [facility] Common Hereditary cancer panel found no pathogenic mutations.   The Common Hereditary Cancers Panel offered by [facility] includes sequencing and/or deletion duplication testing of the following 48 genes: APC, ATM, AXIN2, BARD1, BMPR1A, BRCA1, BRCA2, BRIP1, CDH1, CDKN2A (p14ARF), CDKN2A (p16INK4a), CKD4, CHEK2, CTNNA1, DICER1, EPCAM (Deletion/duplication testing only), GREM1 (promoter region deletion/duplication testing only), KIT, MEN1, MLH1, MSH2, MSH3, MSH6, MUTYH, NBN, NF1, NHTL1, PALB2,  PDGFRA, PMS2, POLD1, POLE, PTEN, RAD50, RAD51C, RAD51D, RNF43, SDHB, SDHC, SDHD, SMAD4, SMARCA4. STK11, TP53, TSC1, TSC2, and VHL.  The following genes were evaluated for sequence changes only: SDHA and HOXB13 c.251G>A variant only.  The test report has been scanned into EPIC and is located under the Molecular Pathology section of the Results Review tab.  A portion of the result report is included below for reference.     We discussed with Ms. Poehlman that because Christine Sutton genetic testing is not perfect, it is possible there may be a gene mutation in one of these genes that Christine Sutton testing cannot detect, but that chance is small.  We also discussed, that there could be another gene that has not yet been discovered, or that we have not yet tested, that is responsible for the cancer diagnoses in the family. It is also possible there is a hereditary cause for the cancer in the family that Ms. Broman did not inherit and therefore was not identified in her testing.  Therefore, it is important to remain in touch with cancer genetics in the future so that we can continue to offer Ms. Shirah the most up to date genetic testing.   Genetic testing did identify a variant of uncertain significance (VUS) in the MSH3 gene called c.562C>T.  At this time, it is unknown if this variant is associated with increased cancer risk or if this is a normal finding, but most variants such as this get reclassified to being inconsequential. It should not be used to make medical management decisions. With time, we suspect the lab will determine the significance of this variant, if any. If we do learn more about it, we will try to contact Ms. Perusse to discuss it further. However, it is important to stay in touch with us periodically and keep the address and phone number up to date.  ADDITIONAL GENETIC TESTING: We discussed with Ms. Hinze that her genetic testing was fairly extensive.  If there are genes identified to increase cancer  risk that can be analyzed in the future, we would be happy to discuss and coordinate this testing at that time.    CANCER SCREENING RECOMMENDATIONS: Ms. Anton's test result is considered negative (normal).  This means that we have not identified a hereditary cause for her  personal and family history of cancer at this time. Most cancers happen by chance and this negative test suggests that her cancer may fall into this category.    While reassuring, this does not definitively rule out a hereditary predisposition to cancer. It is still possible that there could be genetic mutations that are undetectable by Christine Sutton technology. There could be genetic   mutations in genes that have not been tested or identified to increase cancer risk.  Therefore, it is recommended she continue to follow the cancer management and screening guidelines provided by her oncology and primary healthcare provider.   An individual's cancer risk and medical management are not determined by genetic test results alone. Overall cancer risk assessment incorporates additional factors, including personal medical history, family history, and any available genetic information that may result in a personalized plan for cancer prevention and surveillance.  RECOMMENDATIONS FOR FAMILY MEMBERS:  Relatives in this family might be at some increased risk of developing cancer, over the general population risk, simply due to the family history of cancer.  We recommended female relatives in this family have a yearly mammogram beginning at age 36, or 46 years younger than the earliest onset of cancer, an annual clinical breast exam, and perform monthly breast self-exams. Female relatives in this family should also have a gynecological exam as recommended by their primary provider.  All family members should be referred for colonoscopy starting at age 78.    It is also possible there is a hereditary cause for the cancer in Ms. Bucy's family that she did  not inherit and therefore was not identified in her.  Based on Ms. Gil's family history, we recommended her nieces/maternal relatives have genetic counseling and testing. Ms. Rosello will let us know if we can be of any assistance in coordinating genetic counseling and/or testing for these family members.  FOLLOW-UP: Lastly, we discussed with Ms. Piltz that cancer genetics is a rapidly advancing field and it is possible that new genetic tests will be appropriate for her and/or her family members in the future. We encouraged her to remain in contact with cancer genetics on an annual basis so we can update her personal and family histories and let her know of advances in cancer genetics that may benefit this family.   Ms. Breeland questions were answered to her satisfaction today. Our contact information was provided should additional questions or concerns arise. Thank you for the referral and allowing [place] to share in the care of your patient.   Faith Rogue, MS, [facility] Genetic Counselor [email or web address] Phone: [phone]  The patient was seen for a total of 25 minutes in virtual genetic counseling. Patient's son Balinda Quails was also present.  Dr. Grayland Ormond was available for discussion regarding this case.   _______________________________________________________________________ For Office Staff:  Number of people involved in session: 2 Was an Intern/ student involved with case: no

## 2020-07-06 ENCOUNTER — Ambulatory Visit (HOSPITAL_COMMUNITY)
Admission: RE | Admit: 2020-07-06 | Discharge: 2020-07-06 | Disposition: A | Discharge status: Home / Self Care | Payer: Medicare Other | Source: Ambulatory Visit | Attending: Hematology | Admitting: Hematology

## 2020-07-06 ENCOUNTER — Other Ambulatory Visit: Payer: Self-pay

## 2020-07-06 DIAGNOSIS — C50911 Malignant neoplasm of unspecified site of right female breast: Secondary | ICD-10-CM | POA: Diagnosis not present

## 2020-07-06 DIAGNOSIS — C50912 Malignant neoplasm of unspecified site of left female breast: Secondary | ICD-10-CM | POA: Diagnosis not present

## 2020-07-06 MED ORDER — IOHEXOL 300 MG/ML  SOLN
75.0000 mL | Freq: Once | INTRAMUSCULAR | Status: AC | PRN
  Administered 2020-07-06: 75 mL via INTRAVENOUS

## 2020-07-09 ENCOUNTER — Other Ambulatory Visit: Payer: Self-pay

## 2020-07-09 ENCOUNTER — Inpatient Hospital Stay (HOSPITAL_BASED_OUTPATIENT_CLINIC_OR_DEPARTMENT_OTHER): Payer: Medicare Other | Admitting: Hematology

## 2020-07-09 VITALS — BP 148/69 | HR 82 | Temp 97.0°F | Resp 18 | Wt 140.8 lb

## 2020-07-09 DIAGNOSIS — Z853 Personal history of malignant neoplasm of breast: Secondary | ICD-10-CM | POA: Diagnosis not present

## 2020-07-09 DIAGNOSIS — C50912 Malignant neoplasm of unspecified site of left female breast: Secondary | ICD-10-CM

## 2020-07-09 DIAGNOSIS — K862 Cyst of pancreas: Secondary | ICD-10-CM | POA: Diagnosis not present

## 2020-07-09 DIAGNOSIS — C50411 Malignant neoplasm of upper-outer quadrant of right female breast: Secondary | ICD-10-CM | POA: Diagnosis not present

## 2020-07-09 DIAGNOSIS — Z9013 Acquired absence of bilateral breasts and nipples: Secondary | ICD-10-CM | POA: Diagnosis not present

## 2020-07-09 DIAGNOSIS — C50911 Malignant neoplasm of unspecified site of right female breast: Secondary | ICD-10-CM

## 2020-07-09 DIAGNOSIS — Z171 Estrogen receptor negative status [ER-]: Secondary | ICD-10-CM | POA: Diagnosis not present

## 2020-07-09 DIAGNOSIS — M549 Dorsalgia, unspecified: Secondary | ICD-10-CM | POA: Diagnosis not present

## 2020-07-09 NOTE — Progress Notes (Signed)
[place], [place] [address]   CLINIC:  Medical Oncology/Hematology  PCP:  Asencion Noble, MD [address] / [place] [address] [phone]   REASON FOR VISIT:  Follow-up for left and right breast cancer  PRIOR THERAPY:  1. Left mastectomy on 2012. 2. Right mastectomy on 05/15/2020.  NGS Results: Right: ER/PR/HER-2 negative; left: ER/PR positive and HER-2 negative  CURRENT THERAPY: Observation  BRIEF ONCOLOGIC HISTORY:  Oncology History  Invasive ductal carcinoma of left breast ([facility])  01/27/2014 Initial Diagnosis   Invasive ductal carcinoma of left breast ([facility])   06/26/2020 Genetic Testing   Negative genetic testing: no pathogenic variants detected in [facility] Common Hereditary Cancers Panel.  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  The report date is June 26, 2020.   The Common Hereditary Cancers Panel offered by [facility] includes sequencing and/or deletion duplication testing of the following 48 genes: APC, ATM, AXIN2, BARD1, BMPR1A, BRCA1, BRCA2, BRIP1, CDH1, CDK4, CDKN2A (p14ARF), CDKN2A (p16INK4a), CHEK2, CTNNA1, DICER1, EPCAM (Deletion/duplication testing only), GREM1 (promoter region deletion/duplication testing only), KIT, MEN1, MLH1, MSH2, MSH3, MSH6, MUTYH, NBN, NF1, NHTL1, PALB2, PDGFRA, PMS2, POLD1, POLE, PTEN, RAD50, RAD51C, RAD51D, RNF43, SDHB, SDHC, SDHD, SMAD4, SMARCA4. STK11, TP53, TSC1, TSC2, and VHL.  The following genes were evaluated for sequence changes only: SDHA and HOXB13 c.251G>A variant only.     CANCER STAGING: Cancer Staging No matching staging information was found for the patient.  INTERVAL HISTORY:  Christine Sutton, a 87 y.o. female, returns for routine follow-up of her left and right breast cancer. Christine Sutton was last seen on 06/16/2020.   Today she is accompanied by her son and she reports feeling well. She complains of having mild back pain since  her back surgery. Otherwise her energy levels are back to baseline and she is able to do all of her chores and ADL's. She denies having abdominal pain.  She is wondering if she can get her COVID booster;.   REVIEW OF SYSTEMS:  Review of Systems  Constitutional: Negative for appetite change and fatigue.  Gastrointestinal: Negative for abdominal pain.  Musculoskeletal: Positive for back pain (4/10 back pain).  All other systems reviewed and are negative.   PAST MEDICAL/SURGICAL HISTORY:  Past Medical History:  Diagnosis Date  . Breast disorder   . Diabetes mellitus ([facility])   . Family history of breast cancer   . Family history of colon cancer   . Family history of lung cancer   . High blood cholesterol level   . HX: breast cancer    left  . Hypertension   . Invasive ductal carcinoma of left breast ([facility]) 01/27/2014   ER +   Past Surgical History:  Procedure Laterality Date  . BACK SURGERY     lower  . CATARACT EXTRACTION, BILATERAL  11/2014  . MASTECTOMY Left   . SENTINEL NODE BIOPSY Right 05/15/2020   Procedure: SENTINEL NODE BIOPSY;  Surgeon: Virl Cagey, MD;  Location: [facility];  Service: General;  Laterality: Right;  . TOTAL MASTECTOMY Right 05/15/2020   Procedure: TOTAL MASTECTOMY RIGHT BREAST;  Surgeon: Virl Cagey, MD;  Location: [facility];  Service: General;  Laterality: Right;    SOCIAL HISTORY:  Social History   Socioeconomic History  . Marital status: Widowed    Spouse name: Not on file  . Number of children: Not on file  . Years of education: Not on file  . Highest  education level: Not on file  Occupational History  . Not on file  Tobacco Use  . Smoking status: Never Smoker  . Smokeless tobacco: Never Used  Vaping Use  . Vaping Use: Never used  Substance and Sexual Activity  . Alcohol use: No  . Drug use: No  . Sexual activity: Not Currently    Birth control/protection: Surgical    Comment: hyst  Other Topics Concern  . Not on file  Social  History Narrative  . Not on file   Social Determinants of Health   Financial Resource Strain: Low Risk   . Difficulty of Paying Living Expenses: Not hard at all  Food Insecurity: No Food Insecurity  . Worried About Charity fundraiser in the Last Year: Never true  . Ran Out of Food in the Last Year: Never true  Transportation Needs: No Transportation Needs  . Lack of Transportation (Medical): No  . Lack of Transportation (Non-Medical): No  Physical Activity: Insufficiently Active  . Days of Exercise per Week: 3 days  . Minutes of Exercise per Session: 20 min  Stress: No Stress Concern Present  . Feeling of Stress : Only a little  Social Connections: Moderately Isolated  . Frequency of Communication with Friends and Family: Once a week  . Frequency of Social Gatherings with Friends and Family: Once a week  . Attends Religious Services: More than 4 times per year  . Active Member of Clubs or Organizations: Yes  . Attends Archivist Meetings: More than 4 times per year  . Marital Status: Widowed  Intimate Partner Violence: Not At Risk  . Fear of Current or Ex-Partner: No  . Emotionally Abused: No  . Physically Abused: No  . Sexually Abused: No    FAMILY HISTORY:  Family History  Problem Relation Age of Onset  . Cancer Mother   . Breast cancer Mother   . Colon cancer Mother   . Cancer Sister   . Breast cancer Sister   . Cancer Sister   . Lung cancer Father     CURRENT MEDICATIONS:  Current Outpatient Medications  Medication Sig Dispense Refill  . acetaminophen (TYLENOL) 500 MG tablet Take 500 mg by mouth every 6 (six) hours as needed for moderate pain or headache.    Marland Kitchen amLODipine (NORVASC) 5 MG tablet Take 5 mg by mouth at bedtime.     Marland Kitchen aspirin 81 MG chewable tablet Chew 81 mg by mouth at bedtime.    . diphenhydramine-acetaminophen (TYLENOL PM) 25-500 MG TABS Take 1 tablet by mouth at bedtime.     . gabapentin (NEURONTIN) 300 MG capsule Take 300 mg by mouth 3  (three) times daily as needed (pain).     Marland Kitchen HYDROcodone-acetaminophen (NORCO) 5-325 MG tablet Take 1 tablet by mouth every 6 (six) hours as needed for severe pain. 10 tablet 0  . lisinopril-hydrochlorothiazide (PRINZIDE,ZESTORETIC) 20-25 MG per tablet Take 1 tablet by mouth daily.     . metFORMIN (GLUCOPHAGE-XR) 500 MG 24 hr tablet Take 500 mg by mouth 2 (two) times daily.     . Multiple Vitamins-Minerals (CENTRUM SILVER PO) Take 1 capsule by mouth daily.    . nitrofurantoin, macrocrystal-monohydrate, (MACROBID) 100 MG capsule Take 100 mg by mouth daily.    Vladimir Faster Glycol-Propyl Glycol (SYSTANE OP) Place 1 drop into both eyes daily as needed (dry eyes).    . polyethylene glycol (MIRALAX / GLYCOLAX) packet Take 17 g by mouth at bedtime.  No current facility-administered medications for this visit.    ALLERGIES:  Allergies  Allergen Reactions  . Codeine Nausea And Vomiting    PHYSICAL EXAM:  Performance status (ECOG): 1 - Symptomatic but completely ambulatory  Vitals:   07/09/20 1537  BP: (!) 148/69  Pulse: 82  Resp: 18  Temp: (!) 97 F (36.1 C)  SpO2: 98%   Wt Readings from Last 3 Encounters:  07/09/20 140 lb 12.8 oz (63.9 kg)  06/16/20 138 lb 8 oz (62.8 kg)  06/09/20 139 lb (63 kg)   Physical Exam Vitals reviewed.  Constitutional:      Appearance: Normal appearance.  Neurological:     Mental Status: She is alert.  Psychiatric:        Mood and Affect: Mood normal.        Behavior: Behavior normal.      LABORATORY DATA:  I have reviewed the labs as listed.  CBC Latest Ref Rng & Units 06/16/2020 05/13/2020 06/11/2009  WBC 4.0 - 10.5 K/uL 8.3 6.1 10.4  Hemoglobin 12.0 - 15.0 g/dL 13.7 13.5 14.5  Hematocrit 36.0 - 46.0 % 42.0 42.0 43.0  Platelets 150 - 400 K/uL 324 332 328   CMP Latest Ref Rng & Units 06/16/2020 05/13/2020 06/11/2009  Glucose 70 - 99 mg/dL 106(H) 116(H) 123(H)  BUN 8 - 23 mg/dL _0 Creatinine 0.44 - 1.00 mg/dL 0.72 0.66 0.72  Sodium 135 -  145 mmol/L 134(L) 133(L) 139  Potassium 3.5 - 5.1 mmol/L 4.1 3.8 4.3  Chloride 98 - 111 mmol/L 100 97(L) 99  CO2 22 - 32 mmol/L 25 26 33(H)  Calcium 8.9 - 10.3 mg/dL 10.1 10.3 10.7(H)  Total Protein 6.5 - 8.1 g/dL 7.3 - -  Total Bilirubin 0.3 - 1.2 mg/dL 0.5 - -  Alkaline Phos 38 - 126 U/L 84 - -  AST 15 - 41 U/L 17 - -  ALT 0 - 44 U/L 17 - -   Lab Results  Component Value Date   CA2729 37.6 06/16/2020    DIAGNOSTIC IMAGING:  I have independently reviewed the scans and discussed with the patient. NM Bone Scan Whole Body  Result Date: 06/22/2020 CLINICAL DATA:  Breast cancer, staging. History of low back surgery in 2011. Low back pain. Diabetes mellitus, hypertension EXAM: NUCLEAR MEDICINE WHOLE BODY BONE SCAN TECHNIQUE: Whole body anterior and posterior images were obtained approximately 3 hours after intravenous injection of radiopharmaceutical. RADIOPHARMACEUTICALS:  21.2 mCi Technetium-53mMDP IV COMPARISON:  None Radiographic correlation: Lumbar spine radiographs of 02/22/2019 FINDINGS: Uptake at shoulders, sternoclavicular joints, minimally hips and knees, RIGHT wrist, typically degenerative. Mild dextroconvex scoliosis of lumbar spine with mild tracer uptake at L3-L4 and L4-L5, favor degenerative; these areas correspond to degenerative and postsurgical changes on prior radiographs. No definite worrisome sites of tracer uptake are seen to suggest osseous metastatic disease. Expected urinary tract and soft tissue distribution of tracer. IMPRESSION: Scattered degenerative type uptake as above. No definite scintigraphic evidence of osseous metastatic disease. Electronically Signed   By: MLavonia DanaM.D.   On: 06/22/2020 14:37   CT CHEST ABDOMEN PELVIS W CONTRAST  Result Date: 07/06/2020 CLINICAL DATA:  Recent diagnosis right breast cancer, status post right mastectomy 05/2020, remote history of left mastectomy EXAM: CT CHEST, ABDOMEN, AND PELVIS WITH CONTRAST TECHNIQUE: Multidetector CT  imaging of the chest, abdomen and pelvis was performed following the standard protocol during bolus administration of intravenous contrast. CONTRAST:  760mOMNIPAQUE IOHEXOL 300 MG/ML SOLN, additional oral  enteric COMPARISON:  None. FINDINGS: CT CHEST FINDINGS Cardiovascular: No significant vascular findings. Normal heart size. No pericardial effusion. Mediastinum/Nodes: No enlarged mediastinal, hilar, or axillary lymph nodes. Thyroid gland, trachea, and esophagus demonstrate no significant findings. Lungs/Pleura: There is a 6 mm pulmonary nodule of the left lower lobe (series 3, image 114). No pleural effusion or pneumothorax. Musculoskeletal: No suspicious bone lesions identified. Status post bilateral mastectomy. There is a small subcutaneous fluid collection about the right mastectomy site with overlying skin thickening (series 2, image 41). CT ABDOMEN PELVIS FINDINGS Hepatobiliary: No solid liver abnormality is seen. Multiple low-attenuation cysts and/or hemangiomata. No gallstones or gallbladder wall thickening. Mild dilatation of the central common bile duct measuring up to 9 mm. Pancreas: Multiple fluid attenuation lesions of the pancreas. The largest lesions measure 2.0 x 1.4 cm in the pancreatic neck (series 2, image 66) and in the posterior pancreatic tail measuring 1.5 x 1.3 cm (series 2, image 72). No pancreatic ductal dilatation or surrounding inflammatory changes. Spleen: Normal in size without significant abnormality. Adrenals/Urinary Tract: Adrenal glands are unremarkable. Kidneys are normal, without renal calculi, solid lesion, or hydronephrosis. Bladder is unremarkable. Stomach/Bowel: Stomach is within normal limits. Appendix not clearly visualized. No evidence of bowel wall thickening, distention, or inflammatory changes. Pancolonic diverticulosis. Vascular/Lymphatic: Aortic atherosclerosis. No enlarged abdominal or pelvic lymph nodes. Reproductive: Status post hysterectomy. Other: No abdominal  wall hernia or abnormality. No abdominopelvic ascites. Musculoskeletal: No acute or significant osseous findings. IMPRESSION: 1. Status post bilateral mastectomy. There is a small subcutaneous fluid collection about the right mastectomy site with overlying skin thickening, consistent with postoperative seroma or hematoma. 2. No definite evidence of metastatic disease in the chest, abdomen, or pelvis. 3. There is a 6 mm pulmonary nodule in the left lower lobe, statistically most likely incidental, benign sequelae of prior infection or inflammation, isolated manifestation of metastatic disease not favored. Attention on follow-up. 4. Multiple fluid attenuation lesions of the pancreas, the largest of which measure 2.0 x 1.4 cm in the pancreatic neck and 1.5 x 1.3 cm in the pancreatic tail. No pancreatic ductal dilatation. These may reflect pseudocysts or IPMNs. Dedicated pancreatic protocol MRI is optional given advanced patient age and ongoing surveillance imaging for known primary breast malignancy. Comparison to prior imaging, if available, would be helpful to establish long-term stability. 5. Mild dilatation of the central common bile duct measuring up to 9 mm, of uncertain significance. Correlate with biochemical evidence of cholestasis. Aortic Atherosclerosis (ICD10-I70.0). Electronically Signed   By: Eddie Candle M.D.   On: 07/06/2020 18:26     ASSESSMENT:  1.  Right breast TNBC: -Right mastectomy on 05/15/2020 -Invasive lobular carcinoma, multifocal, 1.3 cm in greatest dimension, grade 2, margins negative, 0/4 lymph nodes involved, Ki-67: 50%, ER/PR negative, HER-2 IHC 1+, FISH negative, PT 1 CPN 0 -CT CAP on 07/06/2020 did not show any evidence of metastatic disease. -Bone scan on 06/22/2020 was negative for metastatic disease.  2. Stage I left breast cancerER/PR positive and HER-2 negative: -She had mastectomy in 2012. -Mammogram reviewed by me on 03/28/2018 was BI-RADS Category 1 of the right  breast. -She apparently did not take any antiestrogen therapy.  3.  Family history: -Mother had colon cancer.  Mother and sister had breast cancer. -Father had lung cancer.   PLAN:  1.  T1CN0 invasive lobular carcinoma right breast, ER/PR/HER-2 negative: -We reviewed results of staging CT scan and bone scan.  Bone scan was negative.  CT scan did not show any metastatic  disease in the chest, abdomen or pelvis. -CA 15-3 was slightly elevated at 30.7.  CA 27-29 was normal. -Given her advanced age, we have opted against adjuvant chemotherapy. -Recommend close surveillance.  RTC 3 months with repeat labs and physical exam.  2.  Family history: -[facility] genetic testing was negative.  3.  Pancreatic cysts: -CT CAP showed incidental multiple fluid attenuation lesions of the pancreas, largest measuring 2 x 1.4 cm in the neck and 1.5 x 1.3 cm in the pancreatic tail.  These may reflect pseudocysts or IPMN's. -She does not have any abdominal symptoms at this time.  I do not believe MRI is necessary given her advanced age.  We will likely watch these lesions on subsequent imaging.   Orders placed this encounter:  Orders Placed This Encounter  Procedures  . CBC with Differential/Platelet  . Comprehensive metabolic panel  . Cancer antigen 27.29  . Cancer antigen 15-3     Derek Jack, MD [place] [phone]   I, Milinda Antis, am acting as a scribe for Dr. Sanda Linger.  I, Derek Jack MD, have reviewed the above documentation for accuracy and completeness, and I agree with the above.

## 2020-07-09 NOTE — Patient Instructions (Signed)
[place] at [facility] Discharge Instructions  You were seen today by Dr. Delton Coombes. He went over your recent results and scans. You may proceed with your COVID booster. Dr. Delton Coombes will see you back in 3 months for labs and follow up.   Thank you for choosing [place] at [facility] to provide your oncology and hematology care.  To afford each patient quality time with our provider, please arrive at least 15 minutes before your scheduled appointment time.   If you have a lab appointment with the [facility] please come in thru the Main Entrance and check in at the main information desk  You need to re-schedule your appointment should you arrive 10 or more minutes late.  We strive to give you quality time with our providers, and arriving late affects you and other patients whose appointments are after yours.  Also, if you no show three or more times for appointments you may be dismissed from the clinic at the providers discretion.     Again, thank you for choosing [facility].  Our hope is that these requests will decrease the amount of time that you wait before being seen by our physicians.       _____________________________________________________________  Should you have questions after your visit to [facility], please contact our office at [phone] between the hours of 8:00 a.m. and 4:30 p.m.  Voicemails left after 4:00 p.m. will not be returned until the following business day.  For prescription refill requests, have your pharmacy contact our office and allow 72 hours.    Cancer Center Support Programs:   > Cancer Support Group  2nd Tuesday of the month 1pm-2pm, Journey Room

## 2020-07-10 ENCOUNTER — Telehealth: Payer: Self-pay | Admitting: Internal Medicine

## 2020-07-10 NOTE — Telephone Encounter (Signed)
Pt states she has had 2 covid vaccines. Dr Raliegh Ip told her yesterday is is ok for her to get her booster. Pt states she is going to [facility] to get her booster.

## 2020-07-17 DIAGNOSIS — Z23 Encounter for immunization: Secondary | ICD-10-CM | POA: Diagnosis not present

## 2020-10-07 ENCOUNTER — Inpatient Hospital Stay (HOSPITAL_COMMUNITY): Payer: Medicare Other | Attending: Hematology

## 2020-10-07 ENCOUNTER — Other Ambulatory Visit: Payer: Self-pay

## 2020-10-07 DIAGNOSIS — C50912 Malignant neoplasm of unspecified site of left female breast: Secondary | ICD-10-CM

## 2020-10-07 DIAGNOSIS — C50911 Malignant neoplasm of unspecified site of right female breast: Secondary | ICD-10-CM

## 2020-10-07 DIAGNOSIS — C50411 Malignant neoplasm of upper-outer quadrant of right female breast: Secondary | ICD-10-CM | POA: Diagnosis not present

## 2020-10-07 LAB — CBC WITH DIFFERENTIAL/PLATELET
Abs Immature Granulocytes: 0.05 10*3/uL (ref 0.00–0.07)
Basophils Absolute: 0 10*3/uL (ref 0.0–0.1)
Basophils Relative: 1 %
Eosinophils Absolute: 0.2 10*3/uL (ref 0.0–0.5)
Eosinophils Relative: 2 %
HCT: 42.4 % (ref 36.0–46.0)
Hemoglobin: 13.9 g/dL (ref 12.0–15.0)
Immature Granulocytes: 1 %
Lymphocytes Relative: 21 %
Lymphs Abs: 1.8 10*3/uL (ref 0.7–4.0)
MCH: 29.7 pg (ref 26.0–34.0)
MCHC: 32.8 g/dL (ref 30.0–36.0)
MCV: 90.6 fL (ref 80.0–100.0)
Monocytes Absolute: 0.5 10*3/uL (ref 0.1–1.0)
Monocytes Relative: 6 %
Neutro Abs: 6 10*3/uL (ref 1.7–7.7)
Neutrophils Relative %: 69 %
Platelets: 322 10*3/uL (ref 150–400)
RBC: 4.68 MIL/uL (ref 3.87–5.11)
RDW: 13.5 % (ref 11.5–15.5)
WBC: 8.6 10*3/uL (ref 4.0–10.5)
nRBC: 0 % (ref 0.0–0.2)

## 2020-10-07 LAB — COMPREHENSIVE METABOLIC PANEL
ALT: 20 U/L (ref 0–44)
AST: 19 U/L (ref 15–41)
Albumin: 4.2 g/dL (ref 3.5–5.0)
Alkaline Phosphatase: 85 U/L (ref 38–126)
Anion gap: 9 (ref 5–15)
BUN: 21 mg/dL (ref 8–23)
CO2: 28 mmol/L (ref 22–32)
Calcium: 10.1 mg/dL (ref 8.9–10.3)
Chloride: 96 mmol/L — ABNORMAL LOW (ref 98–111)
Creatinine, Ser: 0.63 mg/dL (ref 0.44–1.00)
GFR, Estimated: >60 mL/min (ref >60)
Glucose, Bld: 162 mg/dL — ABNORMAL HIGH (ref 70–99)
Potassium: 3.9 mmol/L (ref 3.5–5.1)
Sodium: 133 mmol/L — ABNORMAL LOW (ref 135–145)
Total Bilirubin: 0.4 mg/dL (ref 0.3–1.2)
Total Protein: 6.8 g/dL (ref 6.5–8.1)

## 2020-10-08 LAB — CANCER ANTIGEN 27.29: CA 27.29: 29.3 U/mL (ref 0.0–38.6)

## 2020-10-08 LAB — CANCER ANTIGEN 15-3: CA 15-3: 31.4 U/mL — ABNORMAL HIGH (ref 0.0–25.0)

## 2020-10-14 ENCOUNTER — Other Ambulatory Visit: Payer: Self-pay

## 2020-10-14 ENCOUNTER — Inpatient Hospital Stay (HOSPITAL_COMMUNITY): Payer: Medicare Other | Attending: Hematology | Admitting: Hematology

## 2020-10-14 VITALS — BP 146/65 | HR 83 | Temp 98.0°F | Resp 16 | Wt 145.9 lb

## 2020-10-14 DIAGNOSIS — Z7982 Long term (current) use of aspirin: Secondary | ICD-10-CM | POA: Diagnosis not present

## 2020-10-14 DIAGNOSIS — Z9011 Acquired absence of right breast and nipple: Secondary | ICD-10-CM | POA: Diagnosis not present

## 2020-10-14 DIAGNOSIS — K862 Cyst of pancreas: Secondary | ICD-10-CM

## 2020-10-14 DIAGNOSIS — R309 Painful micturition, unspecified: Secondary | ICD-10-CM | POA: Insufficient documentation

## 2020-10-14 DIAGNOSIS — C50411 Malignant neoplasm of upper-outer quadrant of right female breast: Secondary | ICD-10-CM | POA: Insufficient documentation

## 2020-10-14 DIAGNOSIS — Z171 Estrogen receptor negative status [ER-]: Secondary | ICD-10-CM | POA: Insufficient documentation

## 2020-10-14 DIAGNOSIS — Z803 Family history of malignant neoplasm of breast: Secondary | ICD-10-CM | POA: Diagnosis not present

## 2020-10-14 DIAGNOSIS — R978 Other abnormal tumor markers: Secondary | ICD-10-CM | POA: Diagnosis not present

## 2020-10-14 DIAGNOSIS — Z170 Estrogen receptor positive status [ER+]: Secondary | ICD-10-CM | POA: Insufficient documentation

## 2020-10-14 DIAGNOSIS — Z79899 Other long term (current) drug therapy: Secondary | ICD-10-CM | POA: Diagnosis not present

## 2020-10-14 DIAGNOSIS — C50912 Malignant neoplasm of unspecified site of left female breast: Secondary | ICD-10-CM | POA: Diagnosis not present

## 2020-10-14 DIAGNOSIS — Z7984 Long term (current) use of oral hypoglycemic drugs: Secondary | ICD-10-CM | POA: Diagnosis not present

## 2020-10-14 DIAGNOSIS — Z801 Family history of malignant neoplasm of trachea, bronchus and lung: Secondary | ICD-10-CM | POA: Insufficient documentation

## 2020-10-14 DIAGNOSIS — C50911 Malignant neoplasm of unspecified site of right female breast: Secondary | ICD-10-CM | POA: Diagnosis not present

## 2020-10-14 NOTE — Patient Instructions (Addendum)
[place] at [facility] Discharge Instructions  You were seen today by Dr. Delton Coombes. He went over your recent results. Purchase Azo over the counter and use as needed for burning with urination. You will be scheduled to have an MRI scan of your abdomen done before your next visit. Dr. Delton Coombes will see you back in 3 months for labs and follow up.   Thank you for choosing [place] at [facility] to provide your oncology and hematology care.  To afford each patient quality time with our provider, please arrive at least 15 minutes before your scheduled appointment time.   If you have a lab appointment with the [facility] please come in thru the Main Entrance and check in at the main information desk  You need to re-schedule your appointment should you arrive 10 or more minutes late.  We strive to give you quality time with our providers, and arriving late affects you and other patients whose appointments are after yours.  Also, if you no show three or more times for appointments you may be dismissed from the clinic at the providers discretion.     Again, thank you for choosing [facility].  Our hope is that these requests will decrease the amount of time that you wait before being seen by our physicians.       _____________________________________________________________  Should you have questions after your visit to [facility], please contact our office at [phone] between the hours of 8:00 a.m. and 4:30 p.m.  Voicemails left after 4:00 p.m. will not be returned until the following business day.  For prescription refill requests, have your pharmacy contact our office and allow 72 hours.    Cancer Center Support Programs:   > Cancer Support Group  2nd Tuesday of the month 1pm-2pm, Journey Room

## 2020-10-14 NOTE — Progress Notes (Signed)
Christine Sutton [address] [place] [address]   Patient Care Team: Asencion Noble, MD as PCP - General (Internal Medicine)  SUMMARY OF ONCOLOGIC HISTORY: Oncology History  Invasive ductal carcinoma of left breast ([place])  01/27/2014 Initial Diagnosis   Invasive ductal carcinoma of left breast ([place])   06/26/2020 Genetic Testing   Negative genetic testing: no pathogenic variants detected in [facility] Common Hereditary Cancers Panel.  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  The report date is June 26, 2020.   The Common Hereditary Cancers Panel offered by [facility] includes sequencing and/or deletion duplication testing of the following 48 genes: APC, ATM, AXIN2, BARD1, BMPR1A, BRCA1, BRCA2, BRIP1, CDH1, CDK4, CDKN2A (p14ARF), CDKN2A (p16INK4a), CHEK2, CTNNA1, DICER1, EPCAM (Deletion/duplication testing only), GREM1 (promoter region deletion/duplication testing only), KIT, MEN1, MLH1, MSH2, MSH3, MSH6, MUTYH, NBN, NF1, NHTL1, PALB2, PDGFRA, PMS2, POLD1, POLE, PTEN, RAD50, RAD51C, RAD51D, RNF43, SDHB, SDHC, SDHD, SMAD4, SMARCA4. STK11, TP53, TSC1, TSC2, and VHL.  The following genes were evaluated for sequence changes only: SDHA and HOXB13 c.251G>A variant only.     CHIEF COMPLIANT: Follow-up for bilateral breast cancer   INTERVAL HISTORY: Christine Sutton is a 87 y.o. female here today for follow up of her bilateral breast cancer. Her last visit was on 07/09/2020.   Today she is accompanied by her son and she reports feeling well. She denies having any chest, breast or abdominal pain. She has been her arm exercises to improve the swelling. She continues taking Macrobid 100 mg daily but she still has burning with urination. She drinks about 5-6 glasses of water daily.   REVIEW OF SYSTEMS:   Review of Systems  Constitutional: Negative for appetite change and fatigue.  Cardiovascular: Negative for chest pain.  Gastrointestinal:  Positive for nausea. Negative for abdominal pain.  Genitourinary: Positive for dysuria (on Macrobid).   All other systems reviewed and are negative.   I have reviewed the past medical history, past surgical history, social history and family history with the patient and they are unchanged from previous note.   ALLERGIES:   is allergic to codeine.   MEDICATIONS:  Current Outpatient Medications  Medication Sig Dispense Refill  . acetaminophen (TYLENOL) 500 MG tablet Take 500 mg by mouth every 6 (six) hours as needed for moderate pain or headache.    Marland Kitchen amLODipine (NORVASC) 5 MG tablet Take 5 mg by mouth at bedtime.     Marland Kitchen aspirin 81 MG chewable tablet Chew 81 mg by mouth at bedtime.    . diphenhydramine-acetaminophen (TYLENOL PM) 25-500 MG TABS Take 1 tablet by mouth at bedtime.     . gabapentin (NEURONTIN) 300 MG capsule Take 300 mg by mouth 3 (three) times daily as needed (pain).     Marland Kitchen HYDROcodone-acetaminophen (NORCO) 5-325 MG tablet Take 1 tablet by mouth every 6 (six) hours as needed for severe pain. 10 tablet 0  . lisinopril-hydrochlorothiazide (PRINZIDE,ZESTORETIC) 20-25 MG per tablet Take 1 tablet by mouth daily.     . metFORMIN (GLUCOPHAGE-XR) 500 MG 24 hr tablet Take 500 mg by mouth 2 (two) times daily.     . Multiple Vitamins-Minerals (CENTRUM SILVER PO) Take 1 capsule by mouth daily.    . nitrofurantoin, macrocrystal-monohydrate, (MACROBID) 100 MG capsule Take 100 mg by mouth daily.    Vladimir Faster Glycol-Propyl Glycol (SYSTANE OP) Place 1 drop into both eyes daily as needed (dry eyes).    . polyethylene glycol (MIRALAX / GLYCOLAX)  packet Take 17 g by mouth at bedtime.      No current facility-administered medications for this visit.     PHYSICAL EXAMINATION: Performance status (ECOG): 1 - Symptomatic but completely ambulatory  Vitals:   10/14/20 1506  BP: (!) 146/65  Pulse: 83  Resp: 16  Temp: 98 F (36.7 C)  SpO2: 96%   Wt Readings from Last 3 Encounters:   10/14/20 145 lb 14.4 oz (66.2 kg)  07/09/20 140 lb 12.8 oz (63.9 kg)  06/16/20 138 lb 8 oz (62.8 kg)   Physical Exam Vitals reviewed.  Constitutional:      Appearance: Normal appearance.  Cardiovascular:     Rate and Rhythm: Normal rate and regular rhythm.     Pulses: Normal pulses.     Heart sounds: Normal heart sounds.  Pulmonary:     Effort: Pulmonary effort is normal.     Breath sounds: Normal breath sounds.  Chest:  Breasts:     Right: Absent. No swelling, mass, tenderness, axillary adenopathy or supraclavicular adenopathy.     Left: No axillary adenopathy or supraclavicular adenopathy.    Abdominal:     Palpations: Abdomen is soft. There is no hepatomegaly or mass.     Tenderness: There is no abdominal tenderness.  Lymphadenopathy:     Cervical: No cervical adenopathy.     Upper Body:     Right upper body: No supraclavicular, axillary or pectoral adenopathy.     Left upper body: No supraclavicular, axillary or pectoral adenopathy.  Neurological:     General: No focal deficit present.     Mental Status: She is alert and oriented to person, place, and time.  Psychiatric:        Mood and Affect: Mood normal.        Behavior: Behavior normal.     Breast Exam Chaperone: Milinda Antis, MD     LABORATORY DATA:  I have reviewed the data as listed CMP Latest Ref Rng & Units 10/07/2020 06/16/2020 05/13/2020  Glucose 70 - 99 mg/dL 162(H) 106(H) 116(H)  BUN 8 - 23 mg/dL 21 21 18   Creatinine 0.44 - 1.00 mg/dL 0.63 0.72 0.66  Sodium 135 - 145 mmol/L 133(L) 134(L) 133(L)  Potassium 3.5 - 5.1 mmol/L 3.9 4.1 3.8  Chloride 98 - 111 mmol/L 96(L) 100 97(L)  CO2 22 - 32 mmol/L 28 25 26   Calcium 8.9 - 10.3 mg/dL 10.1 10.1 10.3  Total Protein 6.5 - 8.1 g/dL 6.8 7.3 -  Total Bilirubin 0.3 - 1.2 mg/dL 0.4 0.5 -  Alkaline Phos 38 - 126 U/L 85 84 -  AST 15 - 41 U/L 19 17 -  ALT 0 - 44 U/L 20 17 -   Lab Results  Component Value Date   CAN153 31.4 (H) 10/07/2020   CAN153 30.7  (H) 06/16/2020   Lab Results  Component Value Date   WBC 8.6 10/07/2020   HGB 13.9 10/07/2020   HCT 42.4 10/07/2020   MCV 90.6 10/07/2020   PLT 322 10/07/2020   NEUTROABS 6.0 10/07/2020    ASSESSMENT:  1. Right breast TNBC: -Right mastectomy on 05/15/2020. -Invasive lobular carcinoma, multifocal, 1.3 cm in greatest dimension, grade 2, margins negative, 0/4 lymph nodes involved, Ki-67: 50%, ER/PR negative, HER-2 IHC 1+, FISH negative, PT 1 CPN 0 -CT CAP on 07/06/2020 did not show any evidence of metastatic disease. -Bone scan on 06/22/2020 was negative for metastatic disease.  2. Stage I left breast cancerER/PR positive and HER-2 negative: -She had mastectomy  in 2012. -Mammogram reviewed by me on 03/28/2018 was BI-RADS Category 1 of the right breast. -She apparently did not take any antiestrogen therapy.  3. Family history: -Mother had colon cancer. Mother and sister had breast cancer. -Father had lung cancer.   PLAN:  1.T1CN0 invasive lobular carcinoma right breast, ER/PR/HER-2 negative: -Physical examination today did not reveal any palpable masses in the right mastectomy site.  Left breast has no palpable masses. - I have reviewed her labs which showed normal LFTs.  CBC was grossly normal.  CA 15-3 is mildly elevated at 31.4 and a normal CA 27-29.  Previous CA 15-3 was 30.7. - We will plan to repeat CA 15-3 in 3 months.  2. Family history: -[facility] genetic testing was negative.  3.  Pancreatic cysts: -Previous CTAP showed pancreatic pseudocyst. - She does not have any abdominal pain or other symptoms. - I will do MRI pancreatic protocol prior to next visit in 3 months.  I will also repeat CA 19-9.   Breast Cancer therapy associated bone loss: I have recommended calcium, Vitamin D and weight bearing exercises.  Orders placed this encounter:  Orders Placed This Encounter  Procedures  . MR Abdomen W Wo Contrast  . CBC with Differential/Platelet  .  Comprehensive metabolic panel    The patient has a good understanding of the overall plan. She agrees with it. She will call with any problems that may develop before the next visit here.  Derek Jack, MD [facility] [phone]   I, Milinda Antis, am acting as a scribe for Dr. Sanda Linger.  I, Derek Jack MD, have reviewed the above documentation for accuracy and completeness, and I agree with the above.

## 2020-12-05 DIAGNOSIS — M7550 Bursitis of unspecified shoulder: Secondary | ICD-10-CM | POA: Diagnosis not present

## 2020-12-05 DIAGNOSIS — Z853 Personal history of malignant neoplasm of breast: Secondary | ICD-10-CM | POA: Diagnosis not present

## 2020-12-05 DIAGNOSIS — M7590 Shoulder lesion, unspecified, unspecified shoulder: Secondary | ICD-10-CM | POA: Diagnosis not present

## 2020-12-05 DIAGNOSIS — M25512 Pain in left shoulder: Secondary | ICD-10-CM | POA: Diagnosis not present

## 2020-12-09 DIAGNOSIS — M9902 Segmental and somatic dysfunction of thoracic region: Secondary | ICD-10-CM | POA: Diagnosis not present

## 2020-12-09 DIAGNOSIS — M9901 Segmental and somatic dysfunction of cervical region: Secondary | ICD-10-CM | POA: Diagnosis not present

## 2020-12-09 DIAGNOSIS — M542 Cervicalgia: Secondary | ICD-10-CM | POA: Diagnosis not present

## 2020-12-09 DIAGNOSIS — M546 Pain in thoracic spine: Secondary | ICD-10-CM | POA: Diagnosis not present

## 2020-12-11 DIAGNOSIS — M9901 Segmental and somatic dysfunction of cervical region: Secondary | ICD-10-CM | POA: Diagnosis not present

## 2020-12-11 DIAGNOSIS — M542 Cervicalgia: Secondary | ICD-10-CM | POA: Diagnosis not present

## 2020-12-11 DIAGNOSIS — M9902 Segmental and somatic dysfunction of thoracic region: Secondary | ICD-10-CM | POA: Diagnosis not present

## 2020-12-11 DIAGNOSIS — M546 Pain in thoracic spine: Secondary | ICD-10-CM | POA: Diagnosis not present

## 2020-12-14 ENCOUNTER — Other Ambulatory Visit: Payer: Self-pay

## 2020-12-14 ENCOUNTER — Encounter (HOSPITAL_COMMUNITY): Payer: Self-pay | Admitting: *Deleted

## 2020-12-14 ENCOUNTER — Emergency Department (HOSPITAL_COMMUNITY)
Admission: EM | Admit: 2020-12-14 | Discharge: 2020-12-14 | Disposition: A | Discharge status: Home / Self Care | Payer: Medicare Other | Attending: Emergency Medicine | Admitting: Emergency Medicine

## 2020-12-14 DIAGNOSIS — Z853 Personal history of malignant neoplasm of breast: Secondary | ICD-10-CM | POA: Insufficient documentation

## 2020-12-14 DIAGNOSIS — E119 Type 2 diabetes mellitus without complications: Secondary | ICD-10-CM | POA: Insufficient documentation

## 2020-12-14 DIAGNOSIS — Z7984 Long term (current) use of oral hypoglycemic drugs: Secondary | ICD-10-CM | POA: Diagnosis not present

## 2020-12-14 DIAGNOSIS — M542 Cervicalgia: Secondary | ICD-10-CM | POA: Diagnosis present

## 2020-12-14 DIAGNOSIS — I10 Essential (primary) hypertension: Secondary | ICD-10-CM | POA: Insufficient documentation

## 2020-12-14 DIAGNOSIS — M25512 Pain in left shoulder: Secondary | ICD-10-CM | POA: Diagnosis not present

## 2020-12-14 DIAGNOSIS — M436 Torticollis: Secondary | ICD-10-CM | POA: Insufficient documentation

## 2020-12-14 DIAGNOSIS — Z7982 Long term (current) use of aspirin: Secondary | ICD-10-CM | POA: Diagnosis not present

## 2020-12-14 DIAGNOSIS — Z79899 Other long term (current) drug therapy: Secondary | ICD-10-CM | POA: Insufficient documentation

## 2020-12-14 MED ORDER — DEXAMETHASONE SODIUM PHOSPHATE 10 MG/ML IJ SOLN
10.0000 mg | Freq: Once | INTRAMUSCULAR | Status: AC
  Administered 2020-12-14: 10 mg via INTRAMUSCULAR
  Filled 2020-12-14: qty 1

## 2020-12-14 MED ORDER — TRAMADOL HCL 50 MG PO TABS
50.0000 mg | ORAL_TABLET | Freq: Once | ORAL | Status: AC
  Administered 2020-12-14: 50 mg via ORAL
  Filled 2020-12-14: qty 1

## 2020-12-14 MED ORDER — TRAMADOL HCL 50 MG PO TABS: 50.0000 mg | ORAL_TABLET | Freq: Four times a day (QID) | ORAL | 0 refills | Status: DC | PRN

## 2020-12-14 NOTE — ED Triage Notes (Signed)
Pt c/o neck pain that started a little over a week ago when she woke up. Denies injury.

## 2020-12-14 NOTE — ED Provider Notes (Signed)
[facility] EMERGENCY DEPARTMENT Provider Note   CSN: [id number] Arrival date & time: 12/14/20  0932     History Chief Complaint  Patient presents with   Neck Pain    Christine Sutton is a 87 y.o. female.   Neck Pain Associated symptoms: no chest pain, no fever, no numbness and no weakness        Christine Sutton is a 87 y.o. female with past medical history of type 2 diabetes, hypertension and breast cancer with right-sided mastectomy who presents to the Emergency Department complaining of pain of the left neck and left upper shoulder x1 week.  She states that she slept in a lounge chair and woke up with severe pain of her neck and shoulder.  She was seen at urgent care and eating and given a prescription for muscle relaxer and diclofenac.  She denies any improvement of her symptoms.  She was also seen by a chiropractor and had 2 visits in which manipulation of her neck was performed.  She states since that time her pain of her left neck and shoulder area has worsened.  She denies any weakness, numbness or tingling of her arm.  Mild intermittent frontal headache earlier this morning but has since resolved.  She denies any chest pain, shortness of breath, visual changes and dizziness.   Past Medical History:  Diagnosis Date   Breast disorder    Diabetes mellitus ([place])    Family history of breast cancer    Family history of colon cancer    Family history of lung cancer    High blood cholesterol level    HX: breast cancer    left   Hypertension    Invasive ductal carcinoma of left breast ([place]) 01/27/2014   ER +    Patient Active Problem List   Diagnosis Date Noted   Family history of breast cancer    Family history of colon cancer    Family history of lung cancer    Genetic testing 06/28/2020   Breast cancer ([place]) 05/15/2020   Malignant neoplasm of upper-outer quadrant of right breast in female, estrogen receptor negative ([place]) 04/28/2020   History of breast cancer  10/18/2019   Pelvic pressure in female 10/18/2019   Left lower quadrant abdominal tenderness without rebound tenderness 10/18/2019   Vulvar itching 10/18/2019   Screening for colorectal cancer 10/18/2019   Invasive ductal carcinoma of left breast ([place]) 01/27/2014   Diabetes mellitus due to underlying condition without complications ([place]) 93/81/0175   Hypertension, essential, benign 12/24/2013    Past Surgical History:  Procedure Laterality Date   BACK SURGERY     lower   CATARACT EXTRACTION, BILATERAL  11/2014   MASTECTOMY Left    SENTINEL NODE BIOPSY Right 05/15/2020   Procedure: SENTINEL NODE BIOPSY;  Surgeon: Virl Cagey, MD;  Location: [facility];  Service: General;  Laterality: Right;   TOTAL MASTECTOMY Right 05/15/2020   Procedure: TOTAL MASTECTOMY RIGHT BREAST;  Surgeon: Virl Cagey, MD;  Location: [facility];  Service: General;  Laterality: Right;     OB History     Gravida  1   Para  1   Term      Preterm      AB      Living  1      SAB      IAB      Ectopic      Multiple      Live Births  Family History  Problem Relation Age of Onset   Cancer Mother    Breast cancer Mother    Colon cancer Mother    Cancer Sister    Breast cancer Sister    Cancer Sister    Lung cancer Father     Social History   Tobacco Use   Smoking status: Never   Smokeless tobacco: Never  Vaping Use   Vaping Use: Never used  Substance Use Topics   Alcohol use: No   Drug use: No    Home Medications Prior to Admission medications   Medication Sig Start Date End Date Taking? Authorizing Provider  acetaminophen (TYLENOL) 500 MG tablet Take 500 mg by mouth every 6 (six) hours as needed for moderate pain or headache.    [provider]  amLODipine (NORVASC) 5 MG tablet Take 5 mg by mouth at bedtime.     [provider]  aspirin 81 MG chewable tablet Chew 81 mg by mouth at bedtime.    [provider]   diphenhydramine-acetaminophen (TYLENOL PM) 25-500 MG TABS Take 1 tablet by mouth at bedtime.     [provider]  gabapentin (NEURONTIN) 300 MG capsule Take 300 mg by mouth 3 (three) times daily as needed (pain).  09/30/19   [provider]  HYDROcodone-acetaminophen (NORCO) 5-325 MG tablet Take 1 tablet by mouth every 6 (six) hours as needed for severe pain. 05/28/20   Virl Cagey, MD  lisinopril-hydrochlorothiazide (PRINZIDE,ZESTORETIC) 20-25 MG per tablet Take 1 tablet by mouth daily.     [provider]  metFORMIN (GLUCOPHAGE-XR) 500 MG 24 hr tablet Take 500 mg by mouth 2 (two) times daily.  12/12/16   [provider]  Multiple Vitamins-Minerals (CENTRUM SILVER PO) Take 1 capsule by mouth daily.    [provider]  nitrofurantoin, macrocrystal-monohydrate, (MACROBID) 100 MG capsule Take 100 mg by mouth daily. 06/12/20   [provider]  Polyethyl Glycol-Propyl Glycol (SYSTANE OP) Place 1 drop into both eyes daily as needed (dry eyes).    [provider]  polyethylene glycol (MIRALAX / GLYCOLAX) packet Take 17 g by mouth at bedtime.     [provider]    Allergies    Codeine  Review of Systems   Review of Systems  Constitutional:  Negative for chills, fatigue and fever.  HENT:  Negative for trouble swallowing.   Eyes:  Negative for pain and visual disturbance.  Respiratory:  Negative for shortness of breath.   Cardiovascular:  Negative for chest pain.  Gastrointestinal:  Negative for abdominal pain, nausea and vomiting.  Genitourinary:  Negative for dysuria and flank pain.  Musculoskeletal:  Positive for neck pain. Negative for arthralgias, back pain, myalgias and neck stiffness.  Skin:  Negative for rash.  Neurological:  Negative for dizziness, syncope, facial asymmetry, speech difficulty, weakness and numbness.  Hematological:  Does not bruise/bleed easily.  Psychiatric/Behavioral:  Negative for confusion.     Physical Exam Updated Vital Signs BP 113/64   Pulse 73   Temp 98 F (36.7 C) (Oral)   Resp 17   Ht 5' 5.5" (1.664 m)   Wt 63.5 kg   SpO2 95%   BMI 22.94 kg/m   Physical Exam Vitals and nursing note reviewed.  Constitutional:      General: She is not in acute distress.    Appearance: Normal appearance. She is not ill-appearing or toxic-appearing.  HENT:     Head: Atraumatic.     Mouth/Throat:  Mouth: Mucous membranes are moist.     Pharynx: Oropharynx is clear.  Eyes:     Extraocular Movements: Extraocular movements intact.     Conjunctiva/sclera: Conjunctivae normal.     Pupils: Pupils are equal, round, and reactive to light.  Neck:     Comments: Focal tenderness to palpation of the left cervical paraspinal, trapezius and upper rhomboid muscles.  No edema or skin changes. Cardiovascular:     Rate and Rhythm: Normal rate and regular rhythm.     Pulses: Normal pulses.  Pulmonary:     Effort: Pulmonary effort is normal. No respiratory distress.     Breath sounds: Normal breath sounds.  Musculoskeletal:     Cervical back: Tenderness present. No edema, erythema or rigidity. Pain with movement and muscular tenderness present.  Lymphadenopathy:     Cervical: No cervical adenopathy.  Skin:    General: Skin is warm.     Capillary Refill: Capillary refill takes less than 2 seconds.     Findings: No rash.  Neurological:     General: No focal deficit present.     Mental Status: She is alert.     Cranial Nerves: No dysarthria or facial asymmetry.     Sensory: Sensation is intact. No sensory deficit.     Motor: Motor function is intact. No weakness.     Coordination: Coordination is intact.     Comments: CN II-XII intact.  Speech clear.  No pronator drift. No facial droop    ED Results / Procedures / Treatments   Labs (all labs ordered are listed, but only abnormal results are displayed) Labs Reviewed - No data to display  EKG None  Radiology No results  found.  Procedures Procedures   Medications Ordered in ED Medications - No data to display  ED Course  I have reviewed the triage vital signs and the nursing notes.  Pertinent labs & imaging results that were available during my care of the patient were reviewed by me and considered in my medical decision making (see chart for details).    MDM Rules/Calculators/A&P                          Patient here with 1 week history of left-sided neck and upper shoulder pain.  Onset after falling asleep in a chair.  No chest pain, weakness of the face or upper extremities, dysarthria, shortness of breath or visual changes.  No dizziness or headache. Low clinical suspicion for CVA or ACS  On exam, patient has tenderness of the left cervical paraspinal, trapezius and upper rhomboid muscles.  Pain is reproducible with palpation.  No focal neurodeficits.  Vital signs reassuring.  Patient reports visiting a chiropractor x2 with manipulation performed and subsequent worsening of her symptoms.  I feel this is likely a musculoskeletal issue.  She states that she had imaging performed at a local urgent care in [place], imaging report is unavailable to me in electronic medical record.  Pt also seen by Dr. Tomi Bamberger and care plan discussed.    On recheck, she reports feeling better after injection of steroids and oral pain medication.  She is currently taking a muscle relaxer prescribed by UC, will have her d/c Rx for diclofenac  I feel that she is appropriate for d/c home, she agrees to close out pt f/u with PCP this week.  Short course of pain medication provided.  Discussed return precautions   Final Clinical Impression(s) / ED Diagnoses  Final diagnoses:  Torticollis    Rx / DC Orders ED Discharge Orders     None        Kem Parkinson, PA-C 12/15/20 1259    Dorie Rank, MD 12/16/20 [phone]

## 2020-12-14 NOTE — Discharge Instructions (Addendum)
You may alternate ice and heat to your neck.  You may continue your muscle relaxer as directed.  Please stop the diclofenac.  Follow-up with your primary care doctor for recheck.  Return to the emergency department for any new or worsening symptoms.

## 2020-12-15 ENCOUNTER — Other Ambulatory Visit: Payer: Self-pay | Admitting: Internal Medicine

## 2020-12-15 DIAGNOSIS — M5412 Radiculopathy, cervical region: Secondary | ICD-10-CM

## 2020-12-19 ENCOUNTER — Ambulatory Visit (HOSPITAL_BASED_OUTPATIENT_CLINIC_OR_DEPARTMENT_OTHER)
Admission: RE | Admit: 2020-12-19 | Discharge: 2020-12-19 | Disposition: A | Discharge status: Home / Self Care | Payer: Medicare Other | Source: Ambulatory Visit | Attending: Internal Medicine | Admitting: Internal Medicine

## 2020-12-19 ENCOUNTER — Other Ambulatory Visit: Payer: Self-pay

## 2020-12-19 DIAGNOSIS — M542 Cervicalgia: Secondary | ICD-10-CM | POA: Diagnosis not present

## 2020-12-19 DIAGNOSIS — M5412 Radiculopathy, cervical region: Secondary | ICD-10-CM | POA: Insufficient documentation

## 2021-01-13 ENCOUNTER — Ambulatory Visit (HOSPITAL_COMMUNITY): Payer: Medicare Other | Attending: Internal Medicine | Admitting: Physical Therapy

## 2021-01-13 ENCOUNTER — Other Ambulatory Visit: Payer: Self-pay

## 2021-01-13 DIAGNOSIS — M5412 Radiculopathy, cervical region: Secondary | ICD-10-CM | POA: Diagnosis not present

## 2021-01-13 NOTE — Therapy (Signed)
[place], [place], [address] Phone: [phone]   Fax:  [phone]  Physical Therapy Evaluation  Patient Details  Name: Christine Sutton MRN: [id number] Date of Birth: November 20, 1929 Referring Provider (PT): Asencion Noble   Encounter Date: 01/13/2021   PT End of Session - 01/13/21 1552     Visit Number 1    Number of Visits 12    Date for PT Re-Evaluation 02/24/21    Authorization Type Medicare/BCBS    Progress Note Due on Visit 10    PT Start Time [id number]    PT Stop Time 1130    PT Time Calculation (min) 50 min    Activity Tolerance Patient tolerated treatment well    Behavior During Therapy [facility] for tasks assessed/performed             Past Medical History:  Diagnosis Date   Breast disorder    Diabetes mellitus ([place])    Family history of breast cancer    Family history of colon cancer    Family history of lung cancer    High blood cholesterol level    HX: breast cancer    left   Hypertension    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    Subjective Assessment - 01/13/21 1052     Subjective Christine Sutton states that she went to bed on 12/04/2020 and woke up with significant pain in her LT neck, in her shoulder blades and into her arm.  She now only has pain in the Lt side of her neck and down into her Lt hand.  Prior to this she did all of her own housework she is  unable to complete any of her housework at this time.. She has a MD appointment to see an orthopedic tomorrow.  She had a MRI which did not show anything.    Pertinent History brease cancer, DM,    Limitations Sitting;Lifting;Writing;House hold activities    How long can you sit comfortably? 15 minutes    How long can you walk comfortably? 10 minutes    Diagnostic tests MRI,FINDINGS:  Alignment: Mild cervicothoracic scoliosis with 2 mm of  anterolisthesis at C7-T1    Patient Stated Goals to have less pain    Currently in Pain? Yes    Pain Score 9     Pain Location Neck    Pain Orientation Left    Pain Descriptors / Indicators Aching;Burning;Throbbing    Pain Type Acute pain    Pain Radiating Towards LT hand    Pain Onset More than a month ago    Pain Frequency Constant    Aggravating Factors  looking up    Pain Relieving Factors medication pain meds    Effect of Pain on Daily Activities limits  [facility] PT Assessment - 01/13/21 0001       Assessment   Medical Diagnosis cervical radiculopathy    Referring Provider (PT) Asencion Noble    Onset Date/Surgical Date 12/04/20    Hand Dominance Right    Next MD Visit not for this going to ortho next    Prior Therapy none      Precautions   Precautions None      Restrictions   Weight Bearing Restrictions No      Balance Screen   Has the patient fallen in the past 6 months No    Has the patient had a decrease in activity level because of a fear of falling?  Yes    Is the patient reluctant to leave their home because of a fear of falling?  No      Home Ecologist residence      Prior Function   Level of Independence Independent      Cognition   Overall Cognitive Status Within Functional Limits for tasks assessed      Observation/Other Assessments   Focus on Therapeutic Outcomes (FOTO)  42; 58% affected      Posture/Postural Control   Posture/Postural Control Postural limitations     Postural Limitations Forward head;Increased thoracic kyphosis      ROM / Strength   AROM / PROM / Strength AROM      AROM   AROM Assessment Site Cervical    Cervical Flexion 45    Cervical Extension 30   pain runs down her Lt arm   Cervical - Right Side Bend 10    Cervical - Left Side Bend 24    Cervical - Right Rotation 42    Cervical - Left Rotation 19                        Objective measurements completed on examination: See above findings.       [place] Adult PT Treatment/Exercise - 01/13/21 0001       Exercises   Exercises Neck      Neck Exercises: Supine   Neck Retraction 10 reps    Neck Retraction Limitations scapular retraction x 10      Manual Therapy   Manual Therapy Soft tissue mobilization;Manual Traction    Manual therapy comments completed seperate from all other aspects of treatment    Soft tissue mobilization to decrease spasm improve ROM    Manual Traction cervical 60" hold x 5'                    PT Education - 01/13/21 1552     Education Details HEP    Person(s) Educated Patient    Methods Explanation    Comprehension Verbalized understanding;Returned demonstration              PT Short Term Goals - 01/13/21 1601       PT SHORT TERM GOAL #1   Title Pt  to be I in HEP to decrease pain to no greater than a 6/10 to allow pt to be able to complete light housework    Time 3    Period Weeks    Status New    Target Date 02/03/21      PT SHORT TERM GOAL #2   Title PT to be able to rotate cervcial B to 50 degrees to be able to look around the room    Time 3  Period Weeks    Status New      PT SHORT TERM GOAL #3   Title Pt to be I in HEP to decrease nerve irritation as noted by having pain radiating to elbow area only.    Time 3    Period Weeks    Status New               PT Long Term Goals - 01/13/21 1603       PT LONG TERM GOAL #1   Title PT to be I in an advance HEP to allow pain in cervical  area to be no greater that a 3/10 to allow pt to resume cooking activity.    Time 6    Period Weeks    Status New    Target Date 02/24/21      PT LONG TERM GOAL #2   Title PT to no longer have radicular sx .    Time 6    Period Weeks    Target Date 02/24/21                    Plan - 01/13/21 1554     Clinical Impression Statement Christine Sutton is an 87 yo female who woke up with significant cervical pain that radiated into her hand on June24th 2022.  She has been to chiropractic treatment, and has had medication but the pain continues therefore she was referred to skilled PT.  An MRI demonstratesIMPRESSION:  1. Multilevel cervical spondylosis as described, most advanced from  C4-5 through C6-7. Associated facet arthropathy, asymmetric to the  left. No acute osseous findings.  2. Mild spinal stenosis at C5-6 and C6-7.  3. Multilevel foraminal narrowing due to spur, greatest on the left  at C4-5 and C5-6.  Extension increases sx.  Gentle cervical traction decreases sx and flexion has no affect.  Christine Sutton will benefit from skilled PT in a flexion/stabilization based treatment to assit in decreasing her pain, increasing her ROM and improving her functional ability.    Personal Factors and Comorbidities Age;Fitness;Time since onset of injury/illness/exacerbation    Examination-Activity Limitations Bathing;Bed Mobility;Caring for [employer]    Examination-Participation Restrictions Cleaning;Community Activity;Driving;Laundry;Meal Prep    Stability/Clinical Decision Making Evolving/Moderate complexity    Clinical Decision Making Moderate    Rehab Potential Good    PT Frequency 2x / week    PT Duration 6 weeks    PT Treatment/Interventions Manual techniques;Therapeutic exercise;Therapeutic activities;Patient/family education    PT Next Visit Plan begin supine isometric excercises, supine cervical rotatin with chin tuck    PT Home Exercise Plan supine cervical  and scapular retraction.             Patient will benefit from skilled therapeutic intervention in order to improve the following deficits and impairments:  Decreased range of motion, Decreased strength, Increased muscle spasms, Postural dysfunction, Pain  Visit Diagnosis: Radiculopathy, cervical region     Problem List Patient Active Problem List   Diagnosis Date Noted   Family history of breast cancer    Family history of colon cancer    Family history of lung cancer    Genetic testing 06/28/2020   Breast cancer ([place]) 05/15/2020   Malignant neoplasm of upper-outer quadrant of right breast in female, estrogen receptor negative ([place]) 04/28/2020   History of breast cancer 10/18/2019   Pelvic pressure in female 10/18/2019   Left lower quadrant abdominal tenderness without rebound tenderness 10/18/2019   Vulvar itching 10/18/2019  Screening for colorectal cancer 10/18/2019   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 [place], [place], [address] Phone: [phone]   Fax:  [phone]  Name: Christine Sutton MRN: [id number] Date of Birth: June 09, 1930

## 2021-01-14 ENCOUNTER — Other Ambulatory Visit (HOSPITAL_COMMUNITY): Payer: Self-pay | Admitting: Hematology

## 2021-01-14 ENCOUNTER — Other Ambulatory Visit (HOSPITAL_COMMUNITY): Payer: Self-pay

## 2021-01-14 ENCOUNTER — Inpatient Hospital Stay (HOSPITAL_COMMUNITY): Payer: Medicare Other | Attending: Hematology

## 2021-01-14 ENCOUNTER — Ambulatory Visit (INDEPENDENT_AMBULATORY_CARE_PROVIDER_SITE_OTHER): Payer: Medicare Other | Admitting: Orthopaedic Surgery

## 2021-01-14 ENCOUNTER — Ambulatory Visit (HOSPITAL_COMMUNITY)
Admission: RE | Admit: 2021-01-14 | Discharge: 2021-01-14 | Disposition: A | Discharge status: Home / Self Care | Payer: Medicare Other | Source: Ambulatory Visit | Attending: Hematology | Admitting: Hematology

## 2021-01-14 DIAGNOSIS — C50911 Malignant neoplasm of unspecified site of right female breast: Secondary | ICD-10-CM

## 2021-01-14 DIAGNOSIS — Z853 Personal history of malignant neoplasm of breast: Secondary | ICD-10-CM | POA: Insufficient documentation

## 2021-01-14 DIAGNOSIS — Z7984 Long term (current) use of oral hypoglycemic drugs: Secondary | ICD-10-CM | POA: Insufficient documentation

## 2021-01-14 DIAGNOSIS — Z79899 Other long term (current) drug therapy: Secondary | ICD-10-CM | POA: Insufficient documentation

## 2021-01-14 DIAGNOSIS — K862 Cyst of pancreas: Secondary | ICD-10-CM

## 2021-01-14 DIAGNOSIS — M4802 Spinal stenosis, cervical region: Secondary | ICD-10-CM | POA: Insufficient documentation

## 2021-01-14 DIAGNOSIS — N281 Cyst of kidney, acquired: Secondary | ICD-10-CM | POA: Insufficient documentation

## 2021-01-14 DIAGNOSIS — Z800 Family history of malignant neoplasm of digestive organs: Secondary | ICD-10-CM | POA: Diagnosis not present

## 2021-01-14 DIAGNOSIS — Z801 Family history of malignant neoplasm of trachea, bronchus and lung: Secondary | ICD-10-CM | POA: Insufficient documentation

## 2021-01-14 DIAGNOSIS — E119 Type 2 diabetes mellitus without complications: Secondary | ICD-10-CM | POA: Diagnosis not present

## 2021-01-14 DIAGNOSIS — M4722 Other spondylosis with radiculopathy, cervical region: Secondary | ICD-10-CM

## 2021-01-14 DIAGNOSIS — Z9013 Acquired absence of bilateral breasts and nipples: Secondary | ICD-10-CM | POA: Insufficient documentation

## 2021-01-14 DIAGNOSIS — Z803 Family history of malignant neoplasm of breast: Secondary | ICD-10-CM | POA: Diagnosis not present

## 2021-01-14 DIAGNOSIS — R978 Other abnormal tumor markers: Secondary | ICD-10-CM

## 2021-01-14 DIAGNOSIS — C50912 Malignant neoplasm of unspecified site of left female breast: Secondary | ICD-10-CM

## 2021-01-14 DIAGNOSIS — Z7982 Long term (current) use of aspirin: Secondary | ICD-10-CM | POA: Diagnosis not present

## 2021-01-14 DIAGNOSIS — I10 Essential (primary) hypertension: Secondary | ICD-10-CM | POA: Insufficient documentation

## 2021-01-14 DIAGNOSIS — K7689 Other specified diseases of liver: Secondary | ICD-10-CM | POA: Diagnosis not present

## 2021-01-14 DIAGNOSIS — K5730 Diverticulosis of large intestine without perforation or abscess without bleeding: Secondary | ICD-10-CM | POA: Insufficient documentation

## 2021-01-14 LAB — CBC WITH DIFFERENTIAL/PLATELET
Abs Immature Granulocytes: 0.05 10*3/uL (ref 0.00–0.07)
Basophils Absolute: 0.1 10*3/uL (ref 0.0–0.1)
Basophils Relative: 1 %
Eosinophils Absolute: 0.1 10*3/uL (ref 0.0–0.5)
Eosinophils Relative: 2 %
HCT: 40.8 % (ref 36.0–46.0)
Hemoglobin: 13.4 g/dL (ref 12.0–15.0)
Immature Granulocytes: 1 %
Lymphocytes Relative: 17 %
Lymphs Abs: 1.3 10*3/uL (ref 0.7–4.0)
MCH: 29.3 pg (ref 26.0–34.0)
MCHC: 32.8 g/dL (ref 30.0–36.0)
MCV: 89.1 fL (ref 80.0–100.0)
Monocytes Absolute: 0.4 10*3/uL (ref 0.1–1.0)
Monocytes Relative: 6 %
Neutro Abs: 5.6 10*3/uL (ref 1.7–7.7)
Neutrophils Relative %: 73 %
Platelets: 324 10*3/uL (ref 150–400)
RBC: 4.58 MIL/uL (ref 3.87–5.11)
RDW: 13.6 % (ref 11.5–15.5)
WBC: 7.6 10*3/uL (ref 4.0–10.5)
nRBC: 0 % (ref 0.0–0.2)

## 2021-01-14 LAB — COMPREHENSIVE METABOLIC PANEL
ALT: 23 U/L (ref 0–44)
AST: 22 U/L (ref 15–41)
Albumin: 4 g/dL (ref 3.5–5.0)
Alkaline Phosphatase: 77 U/L (ref 38–126)
Anion gap: 8 (ref 5–15)
BUN: 16 mg/dL (ref 8–23)
CO2: 27 mmol/L (ref 22–32)
Calcium: 9.9 mg/dL (ref 8.9–10.3)
Chloride: 97 mmol/L — ABNORMAL LOW (ref 98–111)
Creatinine, Ser: 0.61 mg/dL (ref 0.44–1.00)
GFR, Estimated: >60 mL/min (ref >60)
Glucose, Bld: 177 mg/dL — ABNORMAL HIGH (ref 70–99)
Potassium: 3.8 mmol/L (ref 3.5–5.1)
Sodium: 132 mmol/L — ABNORMAL LOW (ref 135–145)
Total Bilirubin: 0.5 mg/dL (ref 0.3–1.2)
Total Protein: 6.8 g/dL (ref 6.5–8.1)

## 2021-01-14 MED ORDER — GADOBUTROL 1 MMOL/ML IV SOLN
7.0000 mL | Freq: Once | INTRAVENOUS | Status: AC | PRN
  Administered 2021-01-14: 7 mL via INTRAVENOUS

## 2021-01-14 MED ORDER — TIZANIDINE HCL 4 MG PO TABS
4.0000 mg | ORAL_TABLET | Freq: Four times a day (QID) | ORAL | 0 refills | Status: DC | PRN
Start: 2021-01-14 — End: 2021-05-04

## 2021-01-14 NOTE — Progress Notes (Signed)
Office Visit Note   Patient: Christine Sutton           Date of Birth: April 30, 1930           MRN: [id number] Visit Date: 01/14/2021              Requested by: Asencion Noble, MD [address] [place],  [place] [address] PCP: Asencion Noble, MD   Assessment & Plan: Visit Diagnoses:  1. Other spondylosis with radiculopathy, cervical region   2. Foraminal stenosis of cervical region     Plan: We will set her up with home cervical traction states she gets some relief with distraction.  Recheck her in 2 weeks.  We discussed operative intervention if we can get this pain under control.  She requested some 10 tizanidine which had given her relief I sent in 30 tablets she will call if she needs a refill.  Recheck 2 weeks.  MRI scan was reviewed with patient and husband pathophysiology discussed we reviewed the radiologist interpretation and outlined treatment plan.  Follow-Up Instructions: Return in about 2 weeks (around 01/28/2021).   Orders:  No orders of the defined types were placed in this encounter.  Meds ordered this encounter  Medications   tiZANidine (ZANAFLEX) 4 MG tablet    Sig: Take 1 tablet (4 mg total) by mouth every 6 (six) hours as needed for muscle spasms.    Dispense:  30 tablet    Refill:  0      Procedures: No procedures performed   Clinical Data: No additional findings.   Subjective: Chief Complaint  Patient presents with   Neck - Pain   Left Shoulder - Pain    HPI 87 year old female with onset of neck pain 12/04/2020 when she woke up in the morning with her head off to the side slipped off the pillow.  She has had pain left side of her neck and is grabbing her left trapezius.  She has used TENS unit, Aspercreme, collar, chiropractic, muscle relaxant, heat, ice, steroid IM, steroid taper without relief.  Has a history of breast cancer.  Cervical MRI scan showed spondylosis most advanced C4-5, C5-6 and C6-7 on the left with foraminal stenosis worse on the left  than right.  Only mild central stenosis.  Had previous back surgery by Dr. Lucia Gaskins years ago with fusion.  Previous breast cancer patient is here with her husband she states she has been miserable.  Review of Systems all other systems noncontributory HPI.   Objective: Vital Signs: Ht 5' 5.5" (1.664 m)   Wt 140 lb (63.5 kg)   BMI 22.94 kg/m   Physical Exam Constitutional:      Appearance: She is well-developed.  HENT:     Head: Normocephalic.     Right Ear: External ear normal.     Left Ear: External ear normal. There is no impacted cerumen.  Eyes:     Pupils: Pupils are equal, round, and reactive to light.  Neck:     Thyroid: No thyromegaly.     Trachea: No tracheal deviation.  Cardiovascular:     Rate and Rhythm: Normal rate.  Pulmonary:     Effort: Pulmonary effort is normal.  Abdominal:     Palpations: Abdomen is soft.  Musculoskeletal:     Cervical back: No rigidity.  Skin:    General: Skin is warm and dry.  Neurological:     Mental Status: She is alert and oriented to person, place, and time.  Psychiatric:  Behavior: Behavior normal.    Ortho Exam patient has intact upper extremity reflexes.  Positive impingement left shoulder negative drop arm test.  Positive brachial plexus tenderness on the left negative on the right.  Increased pain with compression she gets fairly good relief with distraction.  Specialty Comments:  No specialty comments available.  Imaging: No results found.   PMFS History: Patient Active Problem List   Diagnosis Date Noted   Other spondylosis with radiculopathy, cervical region 01/14/2021   Foraminal stenosis of cervical region 01/14/2021   Family history of breast cancer    Family history of colon cancer    Family history of lung cancer    Genetic testing 06/28/2020   Breast cancer ([place]) 05/15/2020   Malignant neoplasm of upper-outer quadrant of right breast in female, estrogen receptor negative ([place]) 04/28/2020   History of  breast cancer 10/18/2019   Pelvic pressure in female 10/18/2019   Left lower quadrant abdominal tenderness without rebound tenderness 10/18/2019   Vulvar itching 10/18/2019   Screening for colorectal cancer 10/18/2019   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   Diabetes mellitus due to underlying condition without complications ([place]) [id number]   Hypertension, essential, benign 12/24/2013   Past Medical History:  Diagnosis Date   Breast disorder    Diabetes mellitus ([place])    Family history of breast cancer    Family history of colon cancer    Family history of lung cancer    High blood cholesterol level    HX: breast cancer    left   Hypertension    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   ER +    Family History  Problem Relation Age of Onset   Cancer Mother    Breast cancer Mother    Colon cancer Mother    Cancer Sister    Breast cancer Sister    Cancer Sister    Lung cancer Father     Past Surgical History:  Procedure Laterality Date   BACK SURGERY     lower   CATARACT EXTRACTION, BILATERAL  11/2014   MASTECTOMY Left    SENTINEL NODE BIOPSY Right 05/15/2020   Procedure: SENTINEL NODE BIOPSY;  Surgeon: Virl Cagey, MD;  Location: [facility];  Service: General;  Laterality: Right;   TOTAL MASTECTOMY Right 05/15/2020   Procedure: TOTAL MASTECTOMY RIGHT BREAST;  Surgeon: Virl Cagey, MD;  Location: [facility];  Service: General;  Laterality: Right;   Social History   Occupational History   Not on file  Tobacco Use   Smoking status: Never   Smokeless tobacco: Never  Vaping Use   Vaping Use: Never used  Substance and Sexual Activity   Alcohol use: No   Drug use: No   Sexual activity: Not Currently    Birth control/protection: Surgical    Comment: hyst

## 2021-01-15 LAB — CANCER ANTIGEN 19-9: CA 19-9: <2 U/mL (ref 0–35)

## 2021-01-15 LAB — CANCER ANTIGEN 15-3: CA 15-3: 34.8 U/mL — ABNORMAL HIGH (ref 0.0–25.0)

## 2021-01-21 ENCOUNTER — Inpatient Hospital Stay (HOSPITAL_BASED_OUTPATIENT_CLINIC_OR_DEPARTMENT_OTHER): Payer: Medicare Other | Admitting: Nurse Practitioner

## 2021-01-21 ENCOUNTER — Other Ambulatory Visit: Payer: Self-pay

## 2021-01-21 VITALS — BP 176/71 | HR 88 | Temp 96.8°F | Resp 18 | Wt 143.7 lb

## 2021-01-21 DIAGNOSIS — C50911 Malignant neoplasm of unspecified site of right female breast: Secondary | ICD-10-CM

## 2021-01-21 DIAGNOSIS — K862 Cyst of pancreas: Secondary | ICD-10-CM

## 2021-01-21 NOTE — Patient Instructions (Addendum)
Christine Sutton at [facility] Discharge Instructions  You were seen today by Beckey Rutter, NP. She discussed your recent MRI and lab results. It shows that you have what looks like benign (non-cancerous) cysts in the pancreas. She will review these results with Dr. Delton Coombes. Your CA 15-3 tumor marker is slightly elevated. We will continue to monitor this. Your CA 19-5 tumor marker for pancreas was normal. Follow up in 3 months to see Dr. Delton Coombes. Please contact us if you have chest wall pain, bone pain or just not feeling well. We will see you back sooner if you have any of these symptoms.   Please keep follow up as scheduled.   Thank you for choosing [place] at [facility] to provide your oncology and hematology care.  To afford each patient quality time with our provider, please arrive at least 15 minutes before your scheduled appointment time.   If you have a lab appointment with the [facility] please come in thru the Main Entrance and check in at the main information desk.  You need to re-schedule your appointment should you arrive 10 or more minutes late.  We strive to give you quality time with our providers, and arriving late affects you and other patients whose appointments are after yours.  Also, if you no show three or more times for appointments you may be dismissed from the clinic at the providers discretion.     Again, thank you for choosing [facility].  Our hope is that these requests will decrease the amount of time that you wait before being seen by our physicians.       _____________________________________________________________  Should you have questions after your visit to [facility], please contact our office at [phone] and follow the prompts.  Our office hours are 8:00 a.m. and 4:30 p.m. Monday - Friday.  Please note that voicemails left after 4:00 p.m. may not be returned until the  following business day.  We are closed weekends and major holidays.  You do have access to a nurse 24-7, just call the main number to the clinic [phone] and do not press any options, hold on the line and a nurse will answer the phone.    For prescription refill requests, have your pharmacy contact our office and allow 72 hours.    Due to Covid, you will need to wear a mask upon entering the hospital. If you do not have a mask, a mask will be given to you at the Main Entrance upon arrival. For doctor visits, patients may have 1 support person age 41 or older with them. For treatment visits, patients can not have anyone with them due to social distancing guidelines and our immunocompromised population.

## 2021-01-21 NOTE — Progress Notes (Signed)
[place], [place] [address]    Virtual Visit Progress Note  I connected with Darlina Guys on 01/21/21 at  2:20 PM EDT by video enabled telemedicine visit and verified that I am speaking with the correct person using two identifiers.   I discussed the limitations, risks, security and privacy concerns of performing an evaluation and management service by telemedicine and the availability of in-person appointments. I also discussed with the patient that there may be a patient responsible charge related to this service. The patient expressed understanding and agreed to proceed.   Other persons participating in the visit and their role in the encounter: Patient, RN, NP   Patient's location: [facility]  Provider's location: [facility]   Patient Care Team: Asencion Noble, MD as PCP - General (Internal Medicine)  SUMMARY OF ONCOLOGIC HISTORY: Oncology History  Invasive ductal carcinoma of left breast ([place])  01/27/2014 Initial Diagnosis   Invasive ductal carcinoma of left breast ([place])   06/26/2020 Genetic Testing   Negative genetic testing: no pathogenic variants detected in [facility] Common Hereditary Cancers Panel.  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  The report date is June 26, 2020.   The Common Hereditary Cancers Panel offered by [facility] includes sequencing and/or deletion duplication testing of the following 48 genes: APC, ATM, AXIN2, BARD1, BMPR1A, BRCA1, BRCA2, BRIP1, CDH1, CDK4, CDKN2A (p14ARF), CDKN2A (p16INK4a), CHEK2, CTNNA1, DICER1, EPCAM (Deletion/duplication testing only), GREM1 (promoter region deletion/duplication testing only), KIT, MEN1, MLH1, MSH2, MSH3, MSH6, MUTYH, NBN, NF1, NHTL1, PALB2, PDGFRA, PMS2, POLD1, POLE, PTEN, RAD50, RAD51C, RAD51D, RNF43, SDHB, SDHC, SDHD, SMAD4, SMARCA4. STK11, TP53, TSC1, TSC2, and VHL.  The following genes were evaluated for sequence changes only: SDHA and HOXB13  c.251G>A variant only.    CHIEF COMPLIANT: Follow-up for bilateral breast cancer  INTERVAL HISTORY: Ms. RIAN KOON is a 87 y.o. female who returns to clinic for labs, re-evaluation, and discussion of imaging results for history of breast cancer and abnormal ct findings. She feels well. Denies any neurologic complaints. Denies recent fevers or illnesses. Denies any easy bleeding or bruising. No melena or hematochezia. Reports good appetite and denies weight loss. Denies chest pain. Denies any nausea, vomiting, constipation, or diarrhea. Denies urinary complaints. Patient offers no further specific complaints today.  REVIEW OF SYSTEMS:   Review of Systems  Constitutional:  Negative for appetite change, fatigue and unexpected weight change.  HENT:   Negative for mouth sores, sore throat and trouble swallowing.   Respiratory:  Negative for chest tightness and shortness of breath.   Cardiovascular:  Negative for leg swelling.  Gastrointestinal:  Negative for abdominal pain, constipation, diarrhea, nausea and vomiting.  Genitourinary:  Negative for bladder incontinence and dysuria.   Musculoskeletal:  Negative for flank pain and neck stiffness.  Skin:  Negative for itching, rash and wound.  Neurological:  Negative for dizziness, headaches, light-headedness and numbness.  Hematological:  Negative for adenopathy. Does not bruise/bleed easily.  Psychiatric/Behavioral:  Negative for confusion, depression and sleep disturbance. The patient is not nervous/anxious.    ALLERGIES:   is allergic to codeine.   MEDICATIONS:  Current Outpatient Medications  Medication Sig Dispense Refill   acetaminophen (TYLENOL) 500 MG tablet Take 500 mg by mouth every 6 (six) hours as needed for moderate pain or headache.     amLODipine (NORVASC) 5 MG tablet Take 5 mg by mouth at bedtime.      aspirin 81 MG  chewable tablet Chew 81 mg by mouth at bedtime.     cyclobenzaprine (FLEXERIL) 5 MG tablet Take 5 mg by  mouth at bedtime as needed.     diphenhydramine-acetaminophen (TYLENOL PM) 25-500 MG TABS Take 1 tablet by mouth at bedtime.      gabapentin (NEURONTIN) 300 MG capsule Take 300 mg by mouth 3 (three) times daily as needed (pain).      HYDROcodone-acetaminophen (NORCO) 5-325 MG tablet Take 1 tablet by mouth every 6 (six) hours as needed for severe pain. 10 tablet 0   lisinopril-hydrochlorothiazide (PRINZIDE,ZESTORETIC) 20-25 MG per tablet Take 1 tablet by mouth daily.      metFORMIN (GLUCOPHAGE-XR) 500 MG 24 hr tablet Take 500 mg by mouth 2 (two) times daily.      Multiple Vitamins-Minerals (CENTRUM SILVER PO) Take 1 capsule by mouth daily.     nitrofurantoin, macrocrystal-monohydrate, (MACROBID) 100 MG capsule Take 100 mg by mouth daily.     Polyethyl Glycol-Propyl Glycol (SYSTANE OP) Place 1 drop into both eyes daily as needed (dry eyes).     polyethylene glycol (MIRALAX / GLYCOLAX) packet Take 17 g by mouth at bedtime.      tiZANidine (ZANAFLEX) 4 MG tablet Take 1 tablet (4 mg total) by mouth every 6 (six) hours as needed for muscle spasms. 30 tablet 0   traMADol (ULTRAM) 50 MG tablet Take 1 tablet (50 mg total) by mouth every 6 (six) hours as needed. 12 tablet 0   No current facility-administered medications for this visit.   Past Medical History:  Diagnosis Date   Breast disorder    Diabetes mellitus ([place])    Family history of breast cancer    Family history of colon cancer    Family history of lung cancer    High blood cholesterol level    HX: breast cancer    left   Hypertension    Invasive ductal carcinoma of left breast ([place]) 01/27/2014   ER +   Past Surgical History:  Procedure Laterality Date   BACK SURGERY     lower   CATARACT EXTRACTION, BILATERAL  11/2014   MASTECTOMY Left    SENTINEL NODE BIOPSY Right 05/15/2020   Procedure: SENTINEL NODE BIOPSY;  Surgeon: Virl Cagey, MD;  Location: [facility];  Service: General;  Laterality: Right;   TOTAL MASTECTOMY Right  05/15/2020   Procedure: TOTAL MASTECTOMY RIGHT BREAST;  Surgeon: Virl Cagey, MD;  Location: [facility];  Service: General;  Laterality: Right;   Social History   Socioeconomic History   Marital status: Widowed    Spouse name: Not on file   Number of children: Not on file   Years of education: Not on file   Highest education level: Not on file  Occupational History   Not on file  Tobacco Use   Smoking status: Never   Smokeless tobacco: Never  Vaping Use   Vaping Use: Never used  Substance and Sexual Activity   Alcohol use: No   Drug use: No   Sexual activity: Not Currently    Birth control/protection: Surgical    Comment: hyst  Other Topics Concern   Not on file  Social History Narrative   Not on file   Social Determinants of Health   Financial Resource Strain: Not on file  Food Insecurity: Not on file  Transportation Needs: Not on file  Physical Activity: Not on file  Stress: Not on file  Social Connections: Not on file     PHYSICAL  EXAMINATION: Performance status (ECOG): 1 - Symptomatic but completely ambulatory  Vitals:   01/21/21 1400  BP: (!) 176/71  Pulse: 88  Resp: 18  Temp: (!) 96.8 F (36 C)  SpO2: 95%   Wt Readings from Last 3 Encounters:  01/21/21 143 lb 11.8 oz (65.2 kg)  01/14/21 140 lb (63.5 kg)  12/14/20 140 lb (63.5 kg)   Physical Exam Constitutional:      General: She is not in acute distress. HENT:     Head: Normocephalic.  Pulmonary:     Effort: No respiratory distress.  Neurological:     Mental Status: She is alert and oriented to person, place, and time.  Psychiatric:        Mood and Affect: Mood normal.        Behavior: Behavior normal.    LABORATORY DATA:  I have reviewed the data as listed CMP Latest Ref Rng & Units 01/14/2021 10/07/2020 06/16/2020  Glucose 70 - 99 mg/dL 177(H) 162(H) 106(H)  BUN 8 - 23 mg/dL _0 Creatinine 0.44 - 1.00 mg/dL 0.61 0.63 0.72  Sodium 135 - 145 mmol/L 132(L) 133(L) 134(L)  Potassium  3.5 - 5.1 mmol/L 3.8 3.9 4.1  Chloride 98 - 111 mmol/L 97(L) 96(L) 100  CO2 22 - 32 mmol/L _1 Calcium 8.9 - 10.3 mg/dL 9.9 10.1 10.1  Total Protein 6.5 - 8.1 g/dL 6.8 6.8 7.3  Total Bilirubin 0.3 - 1.2 mg/dL 0.5 0.4 0.5  Alkaline Phos 38 - 126 U/L 77 85 84  AST 15 - 41 U/L _2 ALT 0 - 44 U/L _3 Lab Results  Component Value Date   CAN153 34.8 (H) 01/14/2021   CAN153 31.4 (H) 10/07/2020   CAN153 30.7 (H) 06/16/2020   Lab Results  Component Value Date   WBC 7.6 01/14/2021   HGB 13.4 01/14/2021   HCT 40.8 01/14/2021   MCV 89.1 01/14/2021   PLT 324 01/14/2021   NEUTROABS 5.6 01/14/2021    ASSESSMENT:  1.  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 -Invasive lobular carcinoma, multifocal, 1.3 cm in greatest dimension, grade 2, margins negative, 0/4 lymph nodes involved, Ki-67: 50%, ER/PR negative, HER-2 IHC 1+, FISH negative, PT 1 CPN 0 -CT CAP on 07/06/2020 did not show any evidence of metastatic disease. Consider imaging at next visit: lung nodule and possible seroma.  -Bone scan on 06/22/2020 was negative for metastatic disease. - clinically asymptomatic - CA 15-3 has been followed has had been essentially stable to slight uptrend over past 7 months. Monitor closely.  - Genetic testing from 06/28/20 was negative for pathogenic variants - given triple negative nature of her disease she would not benefit from AI treatment.  - NCCN Surveillance guidelines reviewed today. Recommend returning to clinic for labs and re-evaluation in 3 months.    2.  Stage I left breast cancer ER/PR positive and HER-2 negative: - She had mastectomy in 2012. - Mammogram reviewed by me on 03/28/2018 was BI-RADS Category 1 of the right breast. -She apparently did not take any antiestrogen therapy.   3.  Pancreatic Cysts - previous CT A/P showed pancreatic pseudocyst. She was asymptomatic.  - MRI pancreatic protocol was independently  reviewed today and show multiple cysts of the pancreas, largest measuring 1.9cm. Etiology unclear. Will send images to Dr. Delton Coombes to review. CA 19-9 was normal. She remians asymptomatic. Repeat MRI Pancreatic protocol in 1 year.  4. Constipation - d/t opioid therapy - bowel prophylaxis with miralax discussed.   5. Breast Cancer therapy associated bone loss:  - bone density scans managed by pcp.  - last 03/2020 showed t score -1.4/osteopenic - Continue calcium 1200 mg, vitamin d 1000 iu, daily and weight bearing exercise as tolerated  PLAN:  RTC in 3 months for labs, follow up with Dr. Delton Coombes  Orders placed this encounter:  No orders of the defined types were placed in this encounter.  I discussed the assessment and treatment plan with the patient. The patient was provided an opportunity to ask questions and all were answered. The patient agreed with the plan and demonstrated an understanding of the instructions.   The patient was advised to call back or seek an in-person evaluation if the symptoms worsen or if the condition fails to improve as anticipated.   I spent 25 minutes face-to-face video visit time dedicated to the care of this patient on the date of this encounter to include pre-visit review of previous imaging (MRI, CT), labs, medical oncology notes, face-to-face time with the patient, and post visit ordering of testing/documentation.   Beckey Rutter, DNP, AGNP-C [place] [phone]

## 2021-01-22 ENCOUNTER — Telehealth: Payer: Self-pay

## 2021-01-22 NOTE — Telephone Encounter (Signed)
Informed pt that Dr. Raliegh Ip looked over her scans and agrees to repeat a MRI in one year in order to continue surveillance. Pt agrees to plan and understands.

## 2021-01-22 NOTE — Telephone Encounter (Signed)
-----   Message from Verlon Au, NP sent at 01/22/2021  1:25 PM EDT ----- Can you call this lady and let her know dr. Raliegh Ip looked at her scan and agrees that we'll do mri in 1 year to continue surveillance? Thank you!  ----- Message ----- From: Derek Jack, MD Sent: 01/21/2021   4:41 PM EDT To: Verlon Au, NP  Agree with surveillance in 1 year ----- Message ----- From: Verlon Au, NP Sent: 01/21/2021   4:07 PM EDT To: Derek Jack, MD  Would you mind looking at this MRI? Following up on some pancreatic cysts. Tumor marker is normal and she is asymptomatic. Surveillance and imaging in 1 year or more workup? Thanks!

## 2021-01-25 ENCOUNTER — Ambulatory Visit (HOSPITAL_COMMUNITY): Payer: Medicare Other | Admitting: Physical Therapy

## 2021-01-25 ENCOUNTER — Encounter (HOSPITAL_COMMUNITY): Payer: Self-pay | Admitting: Physical Therapy

## 2021-01-25 ENCOUNTER — Other Ambulatory Visit: Payer: Self-pay

## 2021-01-25 DIAGNOSIS — M5412 Radiculopathy, cervical region: Secondary | ICD-10-CM | POA: Diagnosis not present

## 2021-01-26 NOTE — Therapy (Signed)
[place], [place], [address] Phone: [phone]   Fax:  [phone]  Physical Therapy Treatment  Patient Details  Name: Christine Sutton MRN: [id number] Date of Birth: [birth year] Referring Provider (PT): Asencion Noble   Encounter Date: 01/25/2021   PT End of Session - 01/25/21 1706     Visit Number 2    Number of Visits 12    Date for PT Re-Evaluation 02/24/21    Authorization Type Medicare/BCBS    Progress Note Due on Visit 10    PT Start Time 1645    PT Stop Time 1725    PT Time Calculation (min) 40 min    Activity Tolerance Patient tolerated treatment well    Behavior During Therapy [facility] for tasks assessed/performed             Past Medical History:  Diagnosis Date   Breast disorder    Diabetes mellitus ([place])    Family history of breast cancer    Family history of colon cancer    Family history of lung cancer    High blood cholesterol level    HX: breast cancer    left   Hypertension    Invasive ductal carcinoma of left breast ([place]) 01/27/2014   ER +    Past Surgical History:  Procedure Laterality Date   BACK SURGERY     lower   CATARACT EXTRACTION, BILATERAL  11/2014   MASTECTOMY Left    SENTINEL NODE BIOPSY Right 05/15/2020   Procedure: SENTINEL NODE BIOPSY;  Surgeon: Virl Cagey, MD;  Location: [facility];  Service: General;  Laterality: Right;   TOTAL MASTECTOMY Right 05/15/2020   Procedure: TOTAL MASTECTOMY RIGHT BREAST;  Surgeon: Virl Cagey, MD;  Location: [facility];  Service: General;  Laterality: Right;    There were no vitals filed for this visit.   Subjective Assessment - 01/25/21 1701     Subjective [name] reports that shes overall feeling better in her pain, but earlier today she had a treatment that sounds like distraction while sitting and it seemed to have pulled a muscle in her shoulder. She reprots 5/10 pain.    Pertinent History brease cancer, DM,    Limitations  Sitting;Lifting;Writing;House hold activities    How long can you sit comfortably? 15 minutes    How long can you walk comfortably? 10 minutes    Diagnostic tests MRI,FINDINGS:  Alignment: Mild cervicothoracic scoliosis with 2 mm of  anterolisthesis at C7-T1    Patient Stated Goals to have less pain    Pain Score 5     Pain Location Shoulder    Pain Orientation Left    Pain Onset More than a month ago                               [facility] Adult PT Treatment/Exercise - 01/26/21 0001       Neck Exercises: Supine   Neck Retraction 10 reps    Cervical Rotation Both;10 reps      Modalities   Modalities Moist Heat      Manual Therapy   Manual Therapy Soft tissue mobilization    Manual therapy comments completed seperate from all other aspects of treatment                    PT Education - 01/25/21 1704     Education Details listening  to body, heat    Person(s) Educated Patient    Methods Explanation;Demonstration    Comprehension Verbalized understanding;Returned demonstration              PT Short Term Goals - 01/13/21 1601       PT SHORT TERM GOAL #1   Title Pt  to be I in HEP to decrease pain to no greater than a 6/10 to allow pt to be able to complete light housework    Time 3    Period Weeks    Status New    Target Date 02/03/21      PT SHORT TERM GOAL #2   Title PT to be able to rotate cervcial B to 50 degrees to be able to look around the room    Time 3    Period Weeks    Status New      PT SHORT TERM GOAL #3   Title Pt to be I in HEP to decrease nerve irritation as noted by having pain radiating to elbow area only.    Time 3    Period Weeks    Status New               PT Long Term Goals - 01/13/21 1603       PT LONG TERM GOAL #1   Title PT to be I in an advance HEP to allow pain in cervical area to be no greater that a 3/10 to allow pt to resume cooking activity.    Time 6    Period Weeks    Status New     Target Date 02/24/21      PT LONG TERM GOAL #2   Title PT to no longer have radicular sx .    Time 6    Period Weeks    Target Date 02/24/21                   Plan - 01/26/21 0747     Clinical Impression Statement reports increased pain secondary to weighted distraction while sitting at home resulting in TTP and increased pain along upper traps L>R. Demo increasd difficulty with cervical rotation B with increased pulling reported toward the R and increased pain toward the L, discuss listening to her body and progressing to right before pain started to allow for imprived motion in pain free range.    Personal Factors and Comorbidities Age;Fitness;Time since onset of injury/illness/exacerbation    Examination-Activity Limitations Bathing;Bed Mobility;Caring for [employer]    Examination-Participation Restrictions Cleaning;Community Activity;Driving;Laundry;Meal Prep    Stability/Clinical Decision Making Evolving/Moderate complexity    Rehab Potential Good    PT Frequency 2x / week    PT Duration 6 weeks    PT Treatment/Interventions Manual techniques;Therapeutic exercise;Therapeutic activities;Patient/family education    PT Next Visit Plan begin supine isometric excercises, supine cervical rotatin with chin tuck    PT Home Exercise Plan supine cervical and scapular retraction.             Patient will benefit from skilled therapeutic intervention in order to improve the following deficits and impairments:  Decreased range of motion, Decreased strength, Increased muscle spasms, Postural dysfunction, Pain  Visit Diagnosis: Radiculopathy, cervical region     Problem List Patient Active Problem List   Diagnosis Date Noted   Other spondylosis with radiculopathy, cervical region 01/14/2021   Foraminal stenosis of cervical region 01/14/2021   Family history of breast cancer    Family history of  colon cancer    Family history of lung cancer     Genetic testing 06/28/2020   Breast cancer ([place]) 05/15/2020   Malignant neoplasm of upper-outer quadrant of right breast in female, estrogen receptor negative ([place]) 04/28/2020   History of breast cancer 10/18/2019   Pelvic pressure in female 10/18/2019   Left lower quadrant abdominal tenderness without rebound tenderness 10/18/2019   Vulvar itching 10/18/2019   Screening for colorectal cancer 10/18/2019   Invasive ductal carcinoma of left breast ([place]) 01/27/2014   Diabetes mellitus due to underlying condition without complications ([place]) [id number]   Hypertension, essential, benign 12/24/2013    7:50 AM,01/26/21 Domenic Moras, PT, DPT Physical Therapist at [facility] [place], [place], [address] Phone: [phone]   Fax:  [phone]  Name: TAELYN KNAPPER MRN: [id number] Date of Birth: May 18, 1930

## 2021-01-28 ENCOUNTER — Ambulatory Visit (HOSPITAL_COMMUNITY): Payer: Medicare Other | Admitting: Physical Therapy

## 2021-01-28 ENCOUNTER — Ambulatory Visit (INDEPENDENT_AMBULATORY_CARE_PROVIDER_SITE_OTHER): Payer: Medicare Other | Admitting: Orthopaedic Surgery

## 2021-01-28 ENCOUNTER — Other Ambulatory Visit: Payer: Self-pay

## 2021-01-28 ENCOUNTER — Encounter: Payer: Self-pay | Admitting: Orthopaedic Surgery

## 2021-01-28 VITALS — Ht 65.5 in | Wt 143.0 lb

## 2021-01-28 DIAGNOSIS — M5412 Radiculopathy, cervical region: Secondary | ICD-10-CM | POA: Diagnosis not present

## 2021-01-28 DIAGNOSIS — M4722 Other spondylosis with radiculopathy, cervical region: Secondary | ICD-10-CM | POA: Diagnosis not present

## 2021-01-28 MED ORDER — HYDROCODONE-ACETAMINOPHEN 5-325 MG PO TABS: 1.0000 | ORAL_TABLET | Freq: Four times a day (QID) | ORAL | 0 refills | Status: DC | PRN

## 2021-01-28 NOTE — Therapy (Signed)
[place], [place], [address] Phone: [phone]   Fax:  [phone]  Physical Therapy Treatment  Patient Details  Name: Christine Sutton MRN: [id number] Date of Birth: [birth year] Referring Provider (PT): Asencion Noble   Encounter Date: 01/28/2021   PT End of Session - 01/28/21 1211     Visit Number 3    Number of Visits 12    Date for PT Re-Evaluation 02/24/21    Authorization Type Medicare/BCBS    Progress Note Due on Visit 10    PT Start Time 1130    PT Stop Time 1210    PT Time Calculation (min) 40 min    Activity Tolerance Patient tolerated treatment well    Behavior During Therapy [facility] for tasks assessed/performed             Past Medical History:  Diagnosis Date   Breast disorder    Diabetes mellitus ([place])    Family history of breast cancer    Family history of colon cancer    Family history of lung cancer    High blood cholesterol level    HX: breast cancer    left   Hypertension    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   Subjective Assessment - 01/28/21 1127     Subjective Pt states that shehas been doing the exercises her pain is about a 7/10 today.  She has been using the overhead traction and tens unit as well as using heat.    Pertinent History brease cancer, DM,    Limitations Sitting;Lifting;Writing;House hold activities    How long can you  sit comfortably? 15 minutes    How long can you walk comfortably? 10 minutes    Diagnostic tests MRI,FINDINGS:  Alignment: Mild cervicothoracic scoliosis with 2 mm of  anterolisthesis at C7-T1    Patient Stated Goals to have less pain    Currently in Pain? Yes    Pain Score 7     Pain Onset More than a month ago                               [facility] Adult PT Treatment/Exercise - 01/28/21 0001       Exercises   Exercises Neck      Neck Exercises: Supine   Cervical Isometrics Extension;Right lateral flexion;Left lateral flexion;5 secs;5 reps    Neck Retraction 10 reps    Neck Retraction Limitations scapular retraction x 10    Cervical Rotation Both;10 reps    X to V 10 reps    Shoulder Flexion 10 reps    Other Supine Exercise shoulder ER b x 10    Other Supine Exercise protraction/retraction with 1# x 10      Manual Therapy   Manual Therapy Soft tissue mobilization;Manual  Traction    Manual therapy comments completed seperate from all other aspects of treatment    Soft tissue mobilization to decrease spasm improve ROM    Manual Traction cervical 60" hold x 5'                    PT Education - 01/28/21 1211     Education Details advanced HEP    Person(s) Educated Patient    Methods Explanation    Comprehension Verbalized understanding              PT Short Term Goals - 01/28/21 1214       PT SHORT TERM GOAL #1   Title Pt  to be I in HEP to decrease pain to no greater than a 6/10 to allow pt to be able to complete light housework    Time 3    Period Weeks    Status On-going    Target Date 02/03/21      PT SHORT TERM GOAL #2   Title PT to be able to rotate cervcial B to 50 degrees to be able to look around the room    Time 3    Period Weeks    Status On-going      PT SHORT TERM GOAL #3   Title Pt to be I in HEP to decrease nerve irritation as noted by having pain radiating to elbow area only.    Time 3    Period Weeks    Status  On-going               PT Long Term Goals - 01/28/21 1215       PT LONG TERM GOAL #1   Title PT to be I in an advance HEP to allow pain in cervical area to be no greater that a 3/10 to allow pt to resume cooking activity.    Time 6    Period Weeks    Status On-going      PT LONG TERM GOAL #2   Title PT to no longer have radicular sx .    Time 6    Period Weeks    Status On-going                   Plan - 01/28/21 1211     Clinical Impression Statement Advanced HEP to include supine rotation and supine isometric exercises,  PT has signficant difficulty with supine x to v.  Reviewed proper body mechanics for bed mobility as well as the importance of proper posture.    Personal Factors and Comorbidities Age;Fitness;Time since onset of injury/illness/exacerbation    Examination-Activity Limitations Bathing;Bed Mobility;Caring for [employer]    Examination-Participation Restrictions Cleaning;Community Activity;Driving;Laundry;Meal Prep    Stability/Clinical Decision Making Evolving/Moderate complexity    Rehab Potential Good    PT Frequency 2x / week    PT Duration 6 weeks    PT Treatment/Interventions Manual techniques;Therapeutic exercise;Therapeutic activities;Patient/family education    PT Next Visit Plan begin supine t band decompression exercises.    PT Home Exercise Plan supine isometric excercises, supine cervical rotatin  8/18: supine isometric excercises, supine cervical rotatin             Patient will benefit from skilled therapeutic intervention in order to improve the following deficits and impairments:  Decreased range of motion, Decreased strength, Increased muscle spasms, Postural dysfunction, Pain  Visit Diagnosis: Radiculopathy, cervical region     Problem List Patient Active Problem  List   Diagnosis Date Noted   Other spondylosis with radiculopathy, cervical region 01/14/2021   Foraminal stenosis of  cervical region 01/14/2021   Family history of breast cancer    Family history of colon cancer    Family history of lung cancer    Genetic testing 06/28/2020   Breast cancer ([place]) 05/15/2020   Malignant neoplasm of upper-outer quadrant of right breast in female, estrogen receptor negative ([place]) 04/28/2020   History of breast cancer 10/18/2019   Pelvic pressure in female 10/18/2019   Left lower quadrant abdominal tenderness without rebound tenderness 10/18/2019   Vulvar itching 10/18/2019   Screening for colorectal cancer 10/18/2019   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

## 2021-01-28 NOTE — Progress Notes (Signed)
Office Visit Note   Patient: Christine Sutton           Date of Birth: 04-01-1930           MRN: [id number] Visit Date: 01/28/2021              Requested by: Asencion Noble, MD [address] [place],  [place] [address] PCP: Asencion Noble, MD   Assessment & Plan: Visit Diagnoses: No diagnosis found.  Plan: We will add a soft collar.  She will stay with only 8 pounds traction.  If she has ongoing symptoms then we can proceed with MRI imaging.  Follow-up 5 weeks.  Follow-Up Instructions: Return in about 5 weeks (around 03/04/2021).   Orders:  No orders of the defined types were placed in this encounter.  Meds ordered this encounter  Medications   HYDROcodone-acetaminophen (NORCO) 5-325 MG tablet    Sig: Take 1 tablet by mouth every 6 (six) hours as needed for moderate pain.    Dispense:  20 tablet    Refill:  0      Procedures: No procedures performed   Clinical Data: No additional findings.   Subjective: Chief Complaint  Patient presents with   Neck - Pain, Follow-up   Left Shoulder - Pain, Follow-up    HPI 87 year old female returns with ongoing problems with neck pain shoulder pain.  She has been going to therapy but not sure if the traction is helping when she went up on the traction weight she had increased problems but did get better when she was using it at 8 pounds.  She is using the tendon machine, massager, topical rubs.  Muscle relaxant tizanidine helped but she states it made her feel woozy she is also used heat.  Pain rating down her left arm is much better.  She has still pain in the left side of her neck and in her left shoulder.  Denies fever or chills.  No myelopathic symptoms.  She requested something for pain she has had hydrocodone in the past.  Previous steroid taper without relief.  History of breast cancer.  Review of Systems all other systems noncontributory to HPI.   Objective: Vital Signs: Ht 5' 5.5" (1.664 m)   Wt 143 lb (64.9 kg)   BMI  23.43 kg/m   Physical Exam Constitutional:      Appearance: She is well-developed.  HENT:     Head: Normocephalic.     Right Ear: External ear normal.     Left Ear: External ear normal. There is no impacted cerumen.  Eyes:     Pupils: Pupils are equal, round, and reactive to light.  Neck:     Thyroid: No thyromegaly.     Trachea: No tracheal deviation.  Cardiovascular:     Rate and Rhythm: Normal rate.  Pulmonary:     Effort: Pulmonary effort is normal.  Abdominal:     Palpations: Abdomen is soft.  Musculoskeletal:     Cervical back: No rigidity.  Skin:    General: Skin is warm and dry.  Neurological:     Mental Status: She is alert and oriented to person, place, and time.  Psychiatric:        Behavior: Behavior normal.    Ortho Exam patient has brachial plexus tenderness on the left negative on the right.  Upper extremity reflexes are intact no lower extremity hyperreflexia no clonus.  Specialty Comments:  No specialty comments available.  Imaging: No results found.  PMFS History: Patient Active Problem List   Diagnosis Date Noted   Other spondylosis with radiculopathy, cervical region 01/14/2021   Foraminal stenosis of cervical region 01/14/2021   Family history of breast cancer    Family history of colon cancer    Family history of lung cancer    Genetic testing 06/28/2020   Breast cancer ([place]) 05/15/2020   Malignant neoplasm of upper-outer quadrant of right breast in female, estrogen receptor negative ([place]) 04/28/2020   History of breast cancer 10/18/2019   Pelvic pressure in female 10/18/2019   Left lower quadrant abdominal tenderness without rebound tenderness 10/18/2019   Vulvar itching 10/18/2019   Screening for colorectal cancer 10/18/2019   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   Diabetes mellitus due to underlying condition without complications ([place]) 123XX123   Hypertension, essential, benign 12/24/2013   Past Medical  History:  Diagnosis Date   Breast disorder    Diabetes mellitus ([place])    Family history of breast cancer    Family history of colon cancer    Family history of lung cancer    High blood cholesterol level    HX: breast cancer    left   Hypertension    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   ER +    Family History  Problem Relation Age of Onset   Cancer Mother    Breast cancer Mother    Colon cancer Mother    Cancer Sister    Breast cancer Sister    Cancer Sister    Lung cancer Father     Past Surgical History:  Procedure Laterality Date   BACK SURGERY     lower   CATARACT EXTRACTION, BILATERAL  11/2014   MASTECTOMY Left    SENTINEL NODE BIOPSY Right 05/15/2020   Procedure: SENTINEL NODE BIOPSY;  Surgeon: Virl Cagey, MD;  Location: [facility];  Service: General;  Laterality: Right;   TOTAL MASTECTOMY Right 05/15/2020   Procedure: TOTAL MASTECTOMY RIGHT BREAST;  Surgeon: Virl Cagey, MD;  Location: [facility];  Service: General;  Laterality: Right;   Social History   Occupational History   Not on file  Tobacco Use   Smoking status: Never   Smokeless tobacco: Never  Vaping Use   Vaping Use: Never used  Substance and Sexual Activity   Alcohol use: No   Drug use: No   Sexual activity: Not Currently    Birth control/protection: Surgical    Comment: hyst

## 2021-02-02 ENCOUNTER — Encounter (HOSPITAL_COMMUNITY): Payer: Medicare Other | Admitting: Physical Therapy

## 2021-02-02 DIAGNOSIS — H04123 Dry eye syndrome of bilateral lacrimal glands: Secondary | ICD-10-CM | POA: Diagnosis not present

## 2021-02-02 DIAGNOSIS — Z7984 Long term (current) use of oral hypoglycemic drugs: Secondary | ICD-10-CM | POA: Diagnosis not present

## 2021-02-02 DIAGNOSIS — E119 Type 2 diabetes mellitus without complications: Secondary | ICD-10-CM | POA: Diagnosis not present

## 2021-02-02 DIAGNOSIS — Z961 Presence of intraocular lens: Secondary | ICD-10-CM | POA: Diagnosis not present

## 2021-02-04 ENCOUNTER — Other Ambulatory Visit: Payer: Self-pay

## 2021-02-04 ENCOUNTER — Ambulatory Visit (HOSPITAL_COMMUNITY): Payer: Medicare Other | Admitting: Physical Therapy

## 2021-02-04 DIAGNOSIS — M5412 Radiculopathy, cervical region: Secondary | ICD-10-CM

## 2021-02-04 NOTE — Therapy (Signed)
[place], [place], [address] Phone: [phone]   Fax:  [phone]  Physical Therapy Treatment  Patient Details  Name: Christine Sutton MRN: [id number] Date of Birth: 1929/07/10 Referring Provider (PT): Asencion Noble   Encounter Date: 02/04/2021   PT End of Session - 02/04/21 1110     Visit Number 4    Number of Visits 12    Date for PT Re-Evaluation 02/24/21    Authorization Type Medicare/BCBS    Progress Note Due on Visit 10    PT Start Time 1008    PT Stop Time 1050    PT Time Calculation (min) 42 min    Activity Tolerance Patient tolerated treatment well    Behavior During Therapy [facility] for tasks assessed/performed             Past Medical History:  Diagnosis Date   Breast disorder    Diabetes mellitus ([place])    Family history of breast cancer    Family history of colon cancer    Family history of lung cancer    High blood cholesterol level    HX: breast cancer    left   Hypertension    Invasive ductal carcinoma of left breast ([place]) 01/27/2014   ER +    Past Surgical History:  Procedure Laterality Date   BACK SURGERY     lower   CATARACT EXTRACTION, BILATERAL  11/2014   MASTECTOMY Left    SENTINEL NODE BIOPSY Right 05/15/2020   Procedure: SENTINEL NODE BIOPSY;  Surgeon: Virl Cagey, MD;  Location: [facility];  Service: General;  Laterality: Right;   TOTAL MASTECTOMY Right 05/15/2020   Procedure: TOTAL MASTECTOMY RIGHT BREAST;  Surgeon: Virl Cagey, MD;  Location: [facility];  Service: General;  Laterality: Right;    There were no vitals filed for this visit.   Subjective Assessment - 02/04/21 1016     Subjective pt states she is so much better.  Reports pain of 4/10.  STates she is using her home traction 2X day and wearing a soft collar at home (per MD).    Currently in Pain? Yes    Pain Score 4     Pain Location Neck    Pain Orientation Left    Pain Descriptors / Indicators  Tightness;Radiating    Pain Radiating Towards into Lt shoulder and occasional tingling into Lt hand.                               [facility] Adult PT Treatment/Exercise - 02/04/21 0001       Neck Exercises: Standing   Other Standing Exercises retraction, rows and extension theraband RTB 10X each with manual and verbal cues for form and posturing.      Neck Exercises: Seated   X to V 10 reps    W Back 10 reps    Other Seated Exercise cervical retraction and rotation 10X      Neck Exercises: Supine   Neck Retraction --    Neck Retraction Limitations --    Cervical Rotation --    X to V --      Manual Therapy   Manual Therapy Soft tissue mobilization    Manual therapy comments completed seperate from all other aspects of treatment    Soft tissue mobilization to decrease spasm improve ROM  PT Short Term Goals - 01/28/21 1214       PT SHORT TERM GOAL #1   Title Pt  to be I in HEP to decrease pain to no greater than a 6/10 to allow pt to be able to complete light housework    Time 3    Period Weeks    Status On-going    Target Date 02/03/21      PT SHORT TERM GOAL #2   Title PT to be able to rotate cervcial B to 50 degrees to be able to look around the room    Time 3    Period Weeks    Status On-going      PT SHORT TERM GOAL #3   Title Pt to be I in HEP to decrease nerve irritation as noted by having pain radiating to elbow area only.    Time 3    Period Weeks    Status On-going               PT Long Term Goals - 01/28/21 1215       PT LONG TERM GOAL #1   Title PT to be I in an advance HEP to allow pain in cervical area to be no greater that a 3/10 to allow pt to resume cooking activity.    Time 6    Period Weeks    Status On-going      PT LONG TERM GOAL #2   Title PT to no longer have radicular sx .    Time 6    Period Weeks    Status On-going                   Plan - 02/04/21 1154      Clinical Impression Statement Began with postural strengthening using red TheraBand this session.  Pt required manual and verbal cues to complete in correct form and maintain upright posturing.  Transitioned to seated AROM and completed soft tissue mobilization in seated this session.  Noted tightness bilaterally with pt reporting overall improvement following.  PT continues to complete self traction at home.    Personal Factors and Comorbidities Age;Fitness;Time since onset of injury/illness/exacerbation    Examination-Activity Limitations Bathing;Bed Mobility;Caring for [employer]    Examination-Participation Restrictions Cleaning;Community Activity;Driving;Laundry;Meal Prep    Stability/Clinical Decision Making Evolving/Moderate complexity    Rehab Potential Good    PT Frequency 2x / week    PT Duration 6 weeks    PT Treatment/Interventions Manual techniques;Therapeutic exercise;Therapeutic activities;Patient/family education    PT Next Visit Plan Progress UE stabilization and mobilization while reducing pain.    PT Home Exercise Plan supine isometric excercises, supine cervical rotatin  8/18: supine isometric excercises, supine cervical rotatin             Patient will benefit from skilled therapeutic intervention in order to improve the following deficits and impairments:  Decreased range of motion, Decreased strength, Increased muscle spasms, Postural dysfunction, Pain  Visit Diagnosis: Radiculopathy, cervical region     Problem List Patient Active Problem List   Diagnosis Date Noted   Other spondylosis with radiculopathy, cervical region 01/14/2021   Foraminal stenosis of cervical region 01/14/2021   Family history of breast cancer    Family history of colon cancer    Family history of lung cancer    Genetic testing 06/28/2020   Breast cancer ([place]) 05/15/2020   Malignant neoplasm of upper-outer quadrant of right breast in female, estrogen  receptor negative (  [place]) 04/28/2020   History of breast cancer 10/18/2019   Pelvic pressure in female 10/18/2019   Left lower quadrant abdominal tenderness without rebound tenderness 10/18/2019   Vulvar itching 10/18/2019   Screening for colorectal cancer 10/18/2019   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 [place], [place], [address] Phone: [phone]   Fax:  [phone]  Name: ESTHA MARIC MRN: [id number] Date of Birth: [birth year]

## 2021-02-09 ENCOUNTER — Other Ambulatory Visit: Payer: Self-pay

## 2021-02-09 ENCOUNTER — Ambulatory Visit (HOSPITAL_COMMUNITY): Payer: Medicare Other | Admitting: Physical Therapy

## 2021-02-09 DIAGNOSIS — M5412 Radiculopathy, cervical region: Secondary | ICD-10-CM

## 2021-02-09 DIAGNOSIS — C50912 Malignant neoplasm of unspecified site of left female breast: Secondary | ICD-10-CM | POA: Diagnosis not present

## 2021-02-09 DIAGNOSIS — E785 Hyperlipidemia, unspecified: Secondary | ICD-10-CM | POA: Diagnosis not present

## 2021-02-09 DIAGNOSIS — E1129 Type 2 diabetes mellitus with other diabetic kidney complication: Secondary | ICD-10-CM | POA: Diagnosis not present

## 2021-02-09 DIAGNOSIS — Z79899 Other long term (current) drug therapy: Secondary | ICD-10-CM | POA: Diagnosis not present

## 2021-02-09 DIAGNOSIS — I10 Essential (primary) hypertension: Secondary | ICD-10-CM | POA: Diagnosis not present

## 2021-02-09 NOTE — Therapy (Signed)
[place], [place], [address] Phone: [phone]   Fax:  [phone]  Physical Therapy Treatment  Patient Details  Name: Christine Sutton MRN: [id number] Date of Birth: [birth year] Referring Provider (PT): Asencion Noble   Encounter Date: 02/09/2021   PT End of Session - 02/09/21 1112     Visit Number 5    Number of Visits 12    Date for PT Re-Evaluation 02/24/21    Authorization Type Medicare/BCBS    Progress Note Due on Visit 10    PT Start Time 1048    PT Stop Time 1130    PT Time Calculation (min) 42 min    Activity Tolerance Patient tolerated treatment well    Behavior During Therapy [facility] for tasks assessed/performed             Past Medical History:  Diagnosis Date   Breast disorder    Diabetes mellitus ([place])    Family history of breast cancer    Family history of colon cancer    Family history of lung cancer    High blood cholesterol level    HX: breast cancer    left   Hypertension    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   Subjective Assessment - 02/09/21 1059     Subjective pt state she is so much better and did not have any pain for several days after last session.  Very little pain today at 1/10.    Currently in Pain? Yes    Pain Score 1     Pain Location Neck    Pain Orientation Left    Pain Descriptors / Indicators Aching;Tightness                                [facility] Adult PT Treatment/Exercise - 02/09/21 0001       Neck Exercises: Standing   Upper Extremity Flexion with Stabilization 5 reps    Other Standing Exercises retraction, rows and extension theraband RTB 10X 2 sets each with manual and verbal cues for form and posturing.      Neck Exercises: Seated   X to V 10 reps    W Back 10 reps      Neck Exercises: Stretches   Corner Stretch 3 reps;20 seconds      Manual Therapy   Manual Therapy Soft tissue mobilization    Manual therapy comments completed seperate from all other aspects of treatment    Soft tissue mobilization to decrease spasm improve ROM                      PT Short Term Goals - 01/28/21 1214       PT SHORT TERM GOAL #1   Title Pt  to be I  in HEP to decrease pain to no greater than a 6/10 to allow pt to be able to complete light housework    Time 3    Period Weeks    Status On-going    Target Date 02/03/21      PT SHORT TERM GOAL #2   Title PT to be able to rotate cervcial B to 50 degrees to be able to look around the room    Time 3    Period Weeks    Status On-going      PT SHORT TERM GOAL #3   Title Pt to be I in HEP to decrease nerve irritation as noted by having pain radiating to elbow area only.    Time 3    Period Weeks    Status On-going               PT Long Term Goals - 01/28/21 1215       PT LONG TERM GOAL #1   Title PT to be I in an advance HEP to allow pain in cervical area to be no greater that a 3/10 to allow pt to resume cooking activity.    Time 6    Period Weeks    Status On-going      PT LONG TERM GOAL #2   Title PT to no longer have radicular sx .    Time 6    Period Weeks    Status On-going                   Plan - 02/09/21 1308     Clinical Impression Statement Pt pleased with progress and minimal pain remaining in her cervical region.  States she has not used her traction or TENS unit since Saturday.   Continued with postural strengthening and addition of chest stretch and UE flexion against wall.  Pt requires cues for counting repetitions and reminders for hold times and general form with exercises. Pt with multiple spasms Rt UT but without any tightness/spasms on Lt.  Able to resolve and remain painfree at end of session.    Personal Factors and Comorbidities Age;Fitness;Time since onset of injury/illness/exacerbation    Examination-Activity Limitations Bathing;Bed Mobility;Caring for [employer]    Examination-Participation Restrictions Cleaning;Community Activity;Driving;Laundry;Meal Prep    Stability/Clinical Decision Making Evolving/Moderate complexity    Rehab Potential Good    PT Frequency 2x / week    PT Duration 6 weeks    PT Treatment/Interventions Manual techniques;Therapeutic exercise;Therapeutic activities;Patient/family education    PT Next Visit Plan Progress UE stabilization and mobilization while reducing pain.    PT Home Exercise Plan supine isometric excercises, supine cervical rotatin  8/18: supine isometric excercises, supine cervical rotatin             Patient will benefit from skilled therapeutic intervention in order to improve the following deficits and impairments:  Decreased range of motion, Decreased strength, Increased muscle spasms, Postural dysfunction, Pain  Visit Diagnosis: Radiculopathy, cervical region     Problem List Patient Active Problem List   Diagnosis Date Noted   Other spondylosis with radiculopathy, cervical region 01/14/2021   Foraminal stenosis of cervical region 01/14/2021   Family history of breast cancer    Family history of colon cancer    Family history of lung cancer    Genetic testing 06/28/2020   Breast cancer ([place]) 05/15/2020   Malignant neoplasm of upper-outer quadrant of right breast in female, estrogen receptor negative ([place]) 04/28/2020   History  of breast cancer 10/18/2019   Pelvic  pressure in female 10/18/2019   Left lower quadrant abdominal tenderness without rebound tenderness 10/18/2019   Vulvar itching 10/18/2019   Screening for colorectal cancer 10/18/2019   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 [place], [place], [address] Phone: [phone]   Fax:  [phone]  Name: Christine Sutton MRN: [id number] Date of Birth: [birth year]

## 2021-02-11 ENCOUNTER — Other Ambulatory Visit: Payer: Self-pay

## 2021-02-11 ENCOUNTER — Ambulatory Visit (HOSPITAL_COMMUNITY): Payer: Medicare Other | Attending: Internal Medicine | Admitting: Physical Therapy

## 2021-02-11 DIAGNOSIS — M5412 Radiculopathy, cervical region: Secondary | ICD-10-CM | POA: Diagnosis not present

## 2021-02-11 NOTE — Therapy (Signed)
[place], [place], [address] Phone: [phone]   Fax:  [phone]  Physical Therapy Treatment  Patient Details  Name: Christine Sutton MRN: [id number] Date of Birth: Nov 19, 1929 Referring Provider (PT): Asencion Noble   Encounter Date: 02/11/2021   PT End of Session - 02/11/21 1230     Visit Number 6    Number of Visits 12    Date for PT Re-Evaluation 02/24/21    Authorization Type Medicare/BCBS    Progress Note Due on Visit 10    PT Start Time 1130    PT Stop Time 1212    PT Time Calculation (min) 42 min    Activity Tolerance Patient tolerated treatment well    Behavior During Therapy [facility] for tasks assessed/performed             Past Medical History:  Diagnosis Date   Breast disorder    Diabetes mellitus ([place])    Family history of breast cancer    Family history of colon cancer    Family history of lung cancer    High blood cholesterol level    HX: breast cancer    left   Hypertension    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   Subjective Assessment - 02/11/21 1140     Subjective pt states she was sore after last session mostly in her Lower back and Rt hip.  No pain or issues in her neck.  Continues to have little pain at 1/10    Currently in Pain? Yes    Pain Score 1     Pain Orientation Left    Pain Descriptors / Indicators Aching;Tightness    Pain  Type Chronic pain;Acute pain                               [facility] Adult PT Treatment/Exercise - 02/11/21 0001       Neck Exercises: Standing   Upper Extremity Flexion with Stabilization 10 reps    Other Standing Exercises retraction, rows and extension theraband GTB 15X each with manual and verbal cues for form and posturing.      Neck Exercises: Seated   X to V 10 reps    W Back 10 reps      Manual Therapy   Manual Therapy Soft tissue mobilization    Manual therapy comments completed seperate from all other aspects of treatment    Soft tissue mobilization to decrease spasm improve ROM                      PT Short Term Goals - 01/28/21 1214       PT SHORT TERM GOAL #1   Title Pt  to be I in HEP to decrease pain to no greater than  a 6/10 to allow pt to be able to complete light housework    Time 3    Period Weeks    Status On-going    Target Date 02/03/21      PT SHORT TERM GOAL #2   Title PT to be able to rotate cervcial B to 50 degrees to be able to look around the room    Time 3    Period Weeks    Status On-going      PT SHORT TERM GOAL #3   Title Pt to be I in HEP to decrease nerve irritation as noted by having pain radiating to elbow area only.    Time 3    Period Weeks    Status On-going               PT Long Term Goals - 01/28/21 1215       PT LONG TERM GOAL #1   Title PT to be I in an advance HEP to allow pain in cervical area to be no greater that a 3/10 to allow pt to resume cooking activity.    Time 6    Period Weeks    Status On-going      PT LONG TERM GOAL #2   Title PT to no longer have radicular sx .    Time 6    Period Weeks    Status On-going                   Plan - 02/11/21 1231     Clinical Impression Statement Pt continues to progress well with little pain or issues remaining.  Increased to green theraband this session with one set with no issues.  Continues to require cues for form as  mm fatigue and substitition occurs.  Pt also continues to require postural cues with sitting upright. Pt with multiple small spasms in bil UT today but able to resolve.    Personal Factors and Comorbidities Age;Fitness;Time since onset of injury/illness/exacerbation    Examination-Activity Limitations Bathing;Bed Mobility;Caring for [employer]    Examination-Participation Restrictions Cleaning;Community Activity;Driving;Laundry;Meal Prep    Stability/Clinical Decision Making Evolving/Moderate complexity    Rehab Potential Good    PT Frequency 2x / week    PT Duration 6 weeks    PT Treatment/Interventions Manual techniques;Therapeutic exercise;Therapeutic activities;Patient/family education    PT Next Visit Plan Progress UE stabilization and mobilization while reducing pain.    PT Home Exercise Plan supine isometric excercises, supine cervical rotatin  8/18: supine isometric excercises, supine cervical rotatin             Patient will benefit from skilled therapeutic intervention in order to improve the following deficits and impairments:  Decreased range of motion, Decreased strength, Increased muscle spasms, Postural dysfunction, Pain  Visit Diagnosis: Radiculopathy, cervical region     Problem List Patient Active Problem List   Diagnosis Date Noted   Other spondylosis with radiculopathy, cervical region 01/14/2021   Foraminal stenosis of cervical region 01/14/2021   Family history of breast cancer    Family history of colon cancer    Family history of lung cancer    Genetic testing 06/28/2020   Breast cancer ([place]) 05/15/2020   Malignant neoplasm of upper-outer quadrant of right breast in female, estrogen receptor negative ([place]) 04/28/2020   History of breast cancer 10/18/2019   Pelvic pressure in female 10/18/2019   Left lower quadrant abdominal tenderness without rebound tenderness 10/18/2019   Vulvar itching 10/18/2019  Screening for  colorectal cancer 10/18/2019   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

## 2021-02-16 ENCOUNTER — Ambulatory Visit (HOSPITAL_COMMUNITY): Payer: Medicare Other | Admitting: Physical Therapy

## 2021-02-16 ENCOUNTER — Other Ambulatory Visit: Payer: Self-pay

## 2021-02-16 DIAGNOSIS — M791 Myalgia, unspecified site: Secondary | ICD-10-CM | POA: Diagnosis not present

## 2021-02-16 DIAGNOSIS — E785 Hyperlipidemia, unspecified: Secondary | ICD-10-CM | POA: Diagnosis not present

## 2021-02-16 DIAGNOSIS — Z6823 Body mass index (BMI) 23.0-23.9, adult: Secondary | ICD-10-CM | POA: Diagnosis not present

## 2021-02-16 DIAGNOSIS — M5412 Radiculopathy, cervical region: Secondary | ICD-10-CM | POA: Diagnosis not present

## 2021-02-16 DIAGNOSIS — Z853 Personal history of malignant neoplasm of breast: Secondary | ICD-10-CM | POA: Diagnosis not present

## 2021-02-16 DIAGNOSIS — I10 Essential (primary) hypertension: Secondary | ICD-10-CM | POA: Diagnosis not present

## 2021-02-16 DIAGNOSIS — E1122 Type 2 diabetes mellitus with diabetic chronic kidney disease: Secondary | ICD-10-CM | POA: Diagnosis not present

## 2021-02-16 DIAGNOSIS — Z0000 Encounter for general adult medical examination without abnormal findings: Secondary | ICD-10-CM | POA: Diagnosis not present

## 2021-02-16 NOTE — Therapy (Signed)
[place], [place], [address] Phone: [phone]   Fax:  [phone]  Physical Therapy Treatment  Patient Details  Name: Christine Sutton MRN: [id number] Date of Birth: [birth year] Referring Provider (PT): Asencion Noble   Encounter Date: 02/16/2021   PT End of Session - 02/16/21 1359     Visit Number 7    Number of Visits 12    Date for PT Re-Evaluation 02/24/21    Authorization Type Medicare/BCBS    Progress Note Due on Visit 10    PT Start Time [id number]    PT Stop Time 1216    PT Time Calculation (min) 40 min    Activity Tolerance Patient tolerated treatment well    Behavior During Therapy [facility] for tasks assessed/performed             Past Medical History:  Diagnosis Date   Breast disorder    Diabetes mellitus ([place])    Family history of breast cancer    Family history of colon cancer    Family history of lung cancer    High blood cholesterol level    HX: breast cancer    left   Hypertension    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   Subjective Assessment - 02/16/21 1401     Subjective doing well except for her back.  1/10 cervical.    Currently in Pain? Yes    Pain Score 1     Pain Location Neck    Pain Orientation Left    Pain Descriptors / Indicators Aching    Pain Type Chronic pain                               [facility] Adult PT  Treatment/Exercise - 02/16/21 0001       Neck Exercises: Standing   Upper Extremity Flexion with Stabilization 10 reps    Other Standing Exercises retraction, rows and extension theraband GTB 15X each with manual and verbal cues for form and posturing.    Other Standing Exercises UE overhead reach with lateral trunk side bending 10X each      Neck Exercises: Seated   X to V 10 reps    W Back 10 reps      Neck Exercises: Stretches   Corner Stretch 3 reps;20 seconds      Manual Therapy   Manual Therapy Soft tissue mobilization    Manual therapy comments completed seperate from all other aspects of treatment    Soft tissue mobilization to decrease spasm improve ROM                      PT Short Term Goals - 01/28/21 1214       PT SHORT TERM GOAL #1   Title Pt  to be I in HEP to decrease pain to no greater than a 6/10 to allow pt to be able to complete light housework    Time 3    Period Weeks    Status On-going    Target Date 02/03/21      PT SHORT TERM GOAL #2   Title PT to be able to rotate cervcial B to 50 degrees to be able to look around the room    Time 3    Period Weeks    Status On-going      PT SHORT TERM GOAL #3   Title Pt to be I in HEP to decrease nerve irritation as noted by having pain radiating to elbow area only.    Time 3    Period Weeks    Status On-going               PT Long Term Goals - 01/28/21 1215       PT LONG TERM GOAL #1   Title PT to be I in an advance HEP to allow pain in cervical area to be no greater that a 3/10 to allow pt to resume cooking activity.    Time 6    Period Weeks    Status On-going      PT LONG TERM GOAL #2   Title PT to no longer have radicular sx .    Time 6    Period Weeks    Status On-going                   Plan - 02/16/21 1359     Clinical Impression Statement Pt continues to do well with controlled cervical symptoms.  Pt is most frustrated with her back at this point.   Encouraged to resume exercises instructed when receiving therapy from our clinic over a year ago as she states she was doing these and it was helping.  Pt with minimal tightness and spasms in Upper trap or cervical mm today.  Resumed corner stretch today and side overhead reach to incorporate lumbar motion in with UE.    Personal Factors and Comorbidities Age;Fitness;Time since onset of injury/illness/exacerbation    Examination-Activity Limitations Bathing;Bed Mobility;Caring for [employer]    Examination-Participation Restrictions Cleaning;Community Activity;Driving;Laundry;Meal Prep    Stability/Clinical Decision Making Evolving/Moderate complexity    Rehab Potential Good    PT Frequency 2x / week    PT Duration 6 weeks    PT Treatment/Interventions Manual techniques;Therapeutic exercise;Therapeutic activities;Patient/family education    PT Next Visit Plan Progress UE stabilization and mobilization while reducing pain.    PT Home Exercise Plan supine isometric excercises, supine cervical rotatin  8/18: supine isometric excercises, supine cervical rotatin             Patient will benefit from skilled therapeutic intervention in order to improve the following deficits and impairments:  Decreased range of motion, Decreased strength, Increased muscle spasms, Postural dysfunction, Pain  Visit Diagnosis: Radiculopathy, cervical region     Problem List Patient Active Problem List   Diagnosis Date Noted   Other spondylosis with radiculopathy, cervical region 01/14/2021   Foraminal stenosis of cervical region 01/14/2021   Family history of breast cancer    Family history of colon cancer    Family history of lung cancer    Genetic testing 06/28/2020   Breast cancer ([place]) 05/15/2020   Malignant neoplasm of upper-outer quadrant of right breast in female, estrogen receptor negative ([place]) 04/28/2020   History  of breast cancer 10/18/2019   Pelvic pressure in  female 10/18/2019   Left lower quadrant abdominal tenderness without rebound tenderness 10/18/2019   Vulvar itching 10/18/2019   Screening for colorectal cancer 10/18/2019   Invasive ductal carcinoma of left breast ([place]) 01/27/2014   Diabetes mellitus due to underlying condition without complications ([place]) [id number]   Hypertension, essential, benign 12/24/2013   Teena Irani, PTA/CLT [phone]   Teena Irani 02/16/2021, 2:03 PM  [facility] [address] [place], [place], [address] Phone: [phone]   Fax:  [phone]  Name: Christine Sutton MRN: [id number] Date of Birth: [birth year]

## 2021-02-18 ENCOUNTER — Ambulatory Visit (HOSPITAL_COMMUNITY): Payer: Medicare Other | Admitting: Physical Therapy

## 2021-02-18 ENCOUNTER — Other Ambulatory Visit: Payer: Self-pay

## 2021-02-18 DIAGNOSIS — M5412 Radiculopathy, cervical region: Secondary | ICD-10-CM

## 2021-02-18 NOTE — Therapy (Addendum)
[place], [place], [address] Phone: [phone]   Fax:  [phone]  Physical Therapy Treatment  Patient Details  Name: Christine Sutton MRN: [id number] Date of Birth: 1929/08/06 Referring Provider (PT): Asencion Noble   Encounter Date: 02/18/2021   PT End of Session - 02/18/21 1156     Visit Number 8    Number of Visits 12    Date for PT Re-Evaluation 02/24/21    Authorization Type Medicare/BCBS    Progress Note Due on Visit 10    PT Start Time 1130    PT Stop Time 1210    PT Time Calculation (min) 40 min    Activity Tolerance Patient tolerated treatment well    Behavior During Therapy [facility] for tasks assessed/performed             Past Medical History:  Diagnosis Date   Breast disorder    Diabetes mellitus ([place])    Family history of breast cancer    Family history of colon cancer    Family history of lung cancer    High blood cholesterol level    HX: breast cancer    left   Hypertension    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   Subjective Assessment - 02/18/21 1124     Subjective PT states that she continues to feel better.  She states that her neck is doing fine but her back continues to bother her.    Pertinent History brease cancer, DM,    Limitations Sitting;Lifting;Writing;House hold activities    How long can you sit comfortably? 30 minutes was 15     How long can you walk comfortably? 10 minutes    Diagnostic tests MRI,FINDINGS:  Alignment: Mild cervicothoracic scoliosis with 2 mm of  anterolisthesis at C7-T1    Patient Stated Goals to have less pain    Currently in Pain? No/denies   took a pain pill prior to coming.  Her pain was in her back not her neck   Pain Score 0-No pain    Pain Onset More than a month ago                               [facility] Adult PT Treatment/Exercise - 02/18/21 0001       Neck Exercises: Standing   Upper Extremity Flexion with Stabilization 10 reps    Other Standing Exercises row and shoulder extension green x 10      Neck Exercises: Seated   X to V 10 reps    X to V Weights (lbs) 2    W Back 10 reps    Shoulder Rolls Backwards;10 reps    Shoulder Flexion Both;5 reps    Other Seated Exercise cervical and thoracic excursions.    Other Seated Exercise  Shoulder ER 2# x 10                Manual to include soft tissue and PROM completed to improve ROM and decrease pain        PT Short Term Goals - 01/28/21 1214       PT SHORT TERM GOAL #1   Title Pt  to be I in HEP to decrease pain to no greater than a 6/10 to allow pt to be able to complete light housework    Time 3    Period Weeks    Status On-going    Target Date 02/03/21      PT SHORT TERM GOAL #2   Title PT to be able to rotate cervcial B to 50 degrees to be able to look around the room    Time 3    Period Weeks    Status On-going      PT SHORT TERM GOAL #3   Title Pt to be I in HEP to decrease nerve irritation as noted by having pain radiating to elbow area only.    Time 3    Period Weeks    Status On-going               PT Long Term Goals - 01/28/21 1215       PT LONG TERM GOAL #1   Title PT to be I in an advance HEP to allow pain in cervical area to be no greater that a 3/10 to allow pt to resume cooking activity.    Time 6    Period Weeks    Status On-going      PT LONG TERM GOAL #2    Title PT to no longer have radicular sx .    Time 6    Period Weeks    Status On-going                   Plan - 02/18/21 1157     Clinical Impression Statement Pt pain is improved but continues to have limitations in ROM. Added cervical and thoracici excursions as well as gentle PROM with manual .    Personal Factors and Comorbidities Age;Fitness;Time since onset of injury/illness/exacerbation    Examination-Activity Limitations Bathing;Bed Mobility;Caring for [employer]    Examination-Participation Restrictions Cleaning;Community Activity;Driving;Laundry;Meal Prep    Stability/Clinical Decision Making Evolving/Moderate complexity    Rehab Potential Good    PT Frequency 2x / week    PT Duration 6 weeks    PT Treatment/Interventions Manual techniques;Therapeutic exercise;Therapeutic activities;Patient/family education    PT Next Visit Plan Progress UE stabilization and mobilization while reducing pain.    PT Home Exercise Plan supine isometric excercises, supine cervical rotatin  8/18: supine isometric excercises, supine cervical rotatin; 9/8: t band postural exercises, cervical and thoracic excusion             Patient will benefit from skilled therapeutic intervention in order to improve the following deficits and impairments:  Decreased range of motion, Decreased strength, Increased muscle spasms, Postural dysfunction, Pain  Visit Diagnosis: Radiculopathy, cervical region     Problem List Patient Active Problem List   Diagnosis Date Noted   Other spondylosis with radiculopathy, cervical region 01/14/2021   Foraminal stenosis of cervical region 01/14/2021   Family history of breast cancer    Family history of colon cancer    Family history of lung cancer    Genetic testing 06/28/2020   Breast cancer ([place]) 05/15/2020  Malignant neoplasm of upper-outer quadrant of right breast in female, estrogen receptor negative ([place])  04/28/2020   History of breast cancer 10/18/2019   Pelvic pressure in female 10/18/2019   Left lower quadrant abdominal tenderness without rebound tenderness 10/18/2019   Vulvar itching 10/18/2019   Screening for colorectal cancer 10/18/2019   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

## 2021-02-23 ENCOUNTER — Other Ambulatory Visit: Payer: Self-pay

## 2021-02-23 ENCOUNTER — Ambulatory Visit (HOSPITAL_COMMUNITY): Payer: Medicare Other | Admitting: Physical Therapy

## 2021-02-23 DIAGNOSIS — M5412 Radiculopathy, cervical region: Secondary | ICD-10-CM | POA: Diagnosis not present

## 2021-02-23 NOTE — Therapy (Signed)
[place], [place], [address] Phone: [phone]   Fax:  [phone]  Physical Therapy Treatment Progress Note Reporting Period 01/13/2021 to 02/23/2021  See note below for Objective Data and Assessment of Progress/Goals.     Patient Details  Name: Christine Sutton MRN: [id number] Date of Birth: Nov 15, 1929 Referring Provider (PT): Asencion Noble   Encounter Date: 02/23/2021   PT End of Session - 02/23/21 1347     Visit Number 9    Number of Visits 12    Date for PT Re-Evaluation 02/24/21    Authorization Type Medicare/BCBS    Progress Note Due on Visit 10    PT Start Time 1320    PT Stop Time 2119    PT Time Calculation (min) 35 min    Activity Tolerance Patient tolerated treatment well    Behavior During Therapy WFL for tasks assessed/performed             Past Medical History:  Diagnosis Date   Breast disorder    Diabetes mellitus ([place])    Family history of breast cancer    Family history of colon cancer    Family history of lung cancer    High blood cholesterol level    HX: breast cancer    left   Hypertension    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   Subjective Assessment - 02/23/21 1355     Subjective Pt states she is doing well today.  Still without pain in her cervical region and her back is also a little better.  states she mopped 6 rooms in  her house yesterday and walked for 15 minutes without any issues.  States she goes to her back doctor on the 22nd.                [facility] PT Assessment - 02/23/21 1334       Assessment   Medical Diagnosis cervical radiculopathy    Referring Provider (PT) Asencion Noble    Onset Date/Surgical Date 12/04/20    Hand Dominance Right    Next MD Visit not for this going to ortho next    Prior Therapy none      Precautions   Precautions None      Restrictions   Weight Bearing Restrictions No      [place] residence      Prior Function   Level of Independence Independent      Cognition   Overall Cognitive Status Within Functional Limits for tasks assessed      Observation/Other Assessments   Focus on Therapeutic Outcomes (FOTO)  69% functiona status, was 42% functional status      Posture/Postural Control   Posture/Postural Control Postural limitations    Postural Limitations Forward head;Increased thoracic kyphosis    Posture Comments pt now  with increased awareness      AROM   Cervical Flexion 45   was 45 degrees   Cervical Extension 40   was 30 degrees   Cervical - Right Side Bend 20   was 10 degrees   Cervical - Left Side Bend 25   was 24 degrees   Cervical - Right Rotation 50   was 42 degrees   Cervical - Left Rotation 40   was 19 degrees                                     PT Short Term Goals - 02/23/21 1348       PT SHORT TERM GOAL #1   Title Pt  to be I in HEP to decrease pain to no greater than a 6/10 to allow pt to be able to complete light housework    Time 3    Period Weeks    Status Achieved    Target Date 02/03/21      PT SHORT TERM GOAL #2   Title PT to be able to rotate cervcial B to 50 degrees to be able to look around the room    Baseline 50 degrees on Rt, 40 degrees to left    Time 3    Period Weeks    Status Partially Met      PT SHORT TERM GOAL #3   Title Pt to be I in HEP to  decrease nerve irritation as noted by having pain radiating to elbow area only.    Time 3    Period Weeks    Status Achieved               PT Long Term Goals - 02/23/21 1351       PT LONG TERM GOAL #1   Title PT to be I in an advance HEP to allow pain in cervical area to be no greater that a 3/10 to allow pt to resume cooking activity.    Time 6    Period Weeks    Status Achieved      PT LONG TERM GOAL #2   Title PT to no longer have radicular sx .    Time 6    Period Weeks    Status Achieved                   Plan - 02/23/21 1400     Clinical Impression Statement Pt has completed 9 visits for cervical therapy and reports she is ready go tbe discharged at this time.  All goals are met with exception of Lt cervical rotation max of 40 degrees (goal was 50).  Pt has no functional limitations or pain in cervical region or into UE's.  PT has returned to all housework and is walking 10-15 minutes.  Pt returns to MD next week regarding her LBP which is her only limiting factor at this time.  Pt overall pleased with results and ready for discharge at this time.    Personal Factors and Comorbidities Age;Fitness;Time since onset of injury/illness/exacerbation    Examination-Activity Limitations Bathing;Bed Mobility;Caring for [employer]    Examination-Participation Restrictions Cleaning;Community Activity;Driving;Laundry;Meal Prep    Stability/Clinical Decision Making Evolving/Moderate complexity    Rehab Potential Good    PT Frequency 2x / week    PT Duration 6 weeks    PT Treatment/Interventions Manual techniques;Therapeutic exercise;Therapeutic activities;Patient/family education  PT Next Visit Plan Discharge to HEP.    PT Home Exercise Plan supine isometric excercises, supine cervical rotatin  8/18: supine isometric excercises, supine cervical rotatin; 9/8: t band postural exercises, cervical and thoracic excusion              Patient will benefit from skilled therapeutic intervention in order to improve the following deficits and impairments:  Decreased range of motion, Decreased strength, Increased muscle spasms, Postural dysfunction, Pain  Visit Diagnosis: Radiculopathy, cervical region     Problem List Patient Active Problem List   Diagnosis Date Noted   Other spondylosis with radiculopathy, cervical region 01/14/2021   Foraminal stenosis of cervical region 01/14/2021   Family history of breast cancer    Family history of colon cancer    Family history of lung cancer    Genetic testing 06/28/2020   Breast cancer ([place]) 05/15/2020   Malignant neoplasm of upper-outer quadrant of right breast in female, estrogen receptor negative ([place]) 04/28/2020   History of breast cancer 10/18/2019   Pelvic pressure in female 10/18/2019   Left lower quadrant abdominal tenderness without rebound tenderness 10/18/2019   Vulvar itching 10/18/2019   Screening for colorectal cancer 10/18/2019   Invasive ductal carcinoma of left breast ([place]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

## 2021-02-25 ENCOUNTER — Encounter (HOSPITAL_COMMUNITY): Payer: Medicare Other | Admitting: Physical Therapy

## 2021-03-04 ENCOUNTER — Other Ambulatory Visit: Payer: Self-pay

## 2021-03-04 ENCOUNTER — Ambulatory Visit (INDEPENDENT_AMBULATORY_CARE_PROVIDER_SITE_OTHER): Payer: Medicare Other | Admitting: Orthopaedic Surgery

## 2021-03-04 ENCOUNTER — Encounter: Payer: Self-pay | Admitting: Orthopaedic Surgery

## 2021-03-04 VITALS — Ht 65.5 in | Wt 143.0 lb

## 2021-03-04 DIAGNOSIS — M4722 Other spondylosis with radiculopathy, cervical region: Secondary | ICD-10-CM

## 2021-03-04 NOTE — Progress Notes (Signed)
Office Visit Note   Patient: Christine Sutton           Date of Birth: 1930/04/26           MRN: [id number] Visit Date: 03/04/2021              Requested by: Asencion Noble, MD [address] [place],  [place] [address] PCP: Asencion Noble, MD   Assessment & Plan: Visit Diagnoses:  1. Other spondylosis with radiculopathy, cervical region     Plan: Patient got excellent relief states she has no neck pain currently.  She is happy with the results of therapy we will release her from treatment and she can return if she has ongoing problems.  Follow-Up Instructions: Return if symptoms worsen or fail to improve.   Orders:  No orders of the defined types were placed in this encounter.  No orders of the defined types were placed in this encounter.     Procedures: No procedures performed   Clinical Data: No additional findings.   Subjective: Chief Complaint  Patient presents with   Neck - Follow-up    HPI 87 year old female returns for ongoing problems with cervical spondylosis with neck and shoulder pain.  She has been through therapy, traction.  Besides, topical rubs, muscle relaxants.  History of breast cancer.  No metastatic findings at this time.  Patient states since her therapy she is gotten significantly better she has had no pain for the last 2 weeks and states the therapy treatments have massively helped her.  Review of Systems all other systems noncontributory.   Objective: Vital Signs: Ht 5' 5.5" (1.664 m)   Wt 143 lb (64.9 kg)   BMI 23.43 kg/m   Physical Exam Constitutional:      Appearance: She is well-developed.  HENT:     Head: Normocephalic.     Right Ear: External ear normal.     Left Ear: External ear normal. There is no impacted cerumen.  Eyes:     Pupils: Pupils are equal, round, and reactive to light.  Neck:     Thyroid: No thyromegaly.     Trachea: No tracheal deviation.  Cardiovascular:     Rate and Rhythm: Normal rate.  Pulmonary:      Effort: Pulmonary effort is normal.  Abdominal:     Palpations: Abdomen is soft.  Musculoskeletal:     Cervical back: No rigidity.  Skin:    General: Skin is warm and dry.  Neurological:     Mental Status: She is alert and oriented to person, place, and time.  Psychiatric:        Behavior: Behavior normal.    Ortho Exam patient has intact reflexes normal heel toe gait.  Good cervical range of motion.  Specialty Comments:  No specialty comments available.  Imaging: No results found.   PMFS History: Patient Active Problem List   Diagnosis Date Noted   Other spondylosis with radiculopathy, cervical region 01/14/2021   Foraminal stenosis of cervical region 01/14/2021   Family history of breast cancer    Family history of colon cancer    Family history of lung cancer    Genetic testing 06/28/2020   Breast cancer ([place]) 05/15/2020   Malignant neoplasm of upper-outer quadrant of right breast in female, estrogen receptor negative ([place]) 04/28/2020   History of breast cancer 10/18/2019   Pelvic pressure in female 10/18/2019   Left lower quadrant abdominal tenderness without rebound tenderness 10/18/2019   Vulvar itching 10/18/2019  Screening for colorectal cancer 10/18/2019   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   Diabetes mellitus due to underlying condition without complications ([place]) 15/94/5859   Hypertension, essential, benign 12/24/2013   Past Medical History:  Diagnosis Date   Breast disorder    Diabetes mellitus ([place])    Family history of breast cancer    Family history of colon cancer    Family history of lung cancer    High blood cholesterol level    HX: breast cancer    left   Hypertension    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   ER +    Family History  Problem Relation Age of Onset   Cancer Mother    Breast cancer Mother    Colon cancer Mother    Cancer Sister    Breast cancer Sister    Cancer Sister    Lung  cancer Father     Past Surgical History:  Procedure Laterality Date   BACK SURGERY     lower   CATARACT EXTRACTION, BILATERAL  11/2014   MASTECTOMY Left    SENTINEL NODE BIOPSY Right 05/15/2020   Procedure: SENTINEL NODE BIOPSY;  Surgeon: Virl Cagey, MD;  Location: [facility];  Service: General;  Laterality: Right;   TOTAL MASTECTOMY Right 05/15/2020   Procedure: TOTAL MASTECTOMY RIGHT BREAST;  Surgeon: Virl Cagey, MD;  Location: [facility];  Service: General;  Laterality: Right;   Social History   Occupational History   Not on file  Tobacco Use   Smoking status: Never   Smokeless tobacco: Never  Vaping Use   Vaping Use: Never used  Substance and Sexual Activity   Alcohol use: No   Drug use: No   Sexual activity: Not Currently    Birth control/protection: Surgical    Comment: hyst

## 2021-03-09 DIAGNOSIS — Z23 Encounter for immunization: Secondary | ICD-10-CM | POA: Diagnosis not present

## 2021-03-16 DIAGNOSIS — Z23 Encounter for immunization: Secondary | ICD-10-CM | POA: Diagnosis not present

## 2021-04-19 ENCOUNTER — Inpatient Hospital Stay (HOSPITAL_COMMUNITY): Payer: Medicare Other | Attending: Hematology

## 2021-04-19 DIAGNOSIS — K862 Cyst of pancreas: Secondary | ICD-10-CM | POA: Insufficient documentation

## 2021-04-19 DIAGNOSIS — Z803 Family history of malignant neoplasm of breast: Secondary | ICD-10-CM | POA: Diagnosis not present

## 2021-04-19 DIAGNOSIS — C50911 Malignant neoplasm of unspecified site of right female breast: Secondary | ICD-10-CM | POA: Diagnosis not present

## 2021-04-19 DIAGNOSIS — Z170 Estrogen receptor positive status [ER+]: Secondary | ICD-10-CM | POA: Insufficient documentation

## 2021-04-19 DIAGNOSIS — C50912 Malignant neoplasm of unspecified site of left female breast: Secondary | ICD-10-CM | POA: Insufficient documentation

## 2021-04-19 DIAGNOSIS — Z801 Family history of malignant neoplasm of trachea, bronchus and lung: Secondary | ICD-10-CM | POA: Diagnosis not present

## 2021-04-19 LAB — COMPREHENSIVE METABOLIC PANEL
ALT: 18 U/L (ref 0–44)
AST: 16 U/L (ref 15–41)
Albumin: 4 g/dL (ref 3.5–5.0)
Alkaline Phosphatase: 79 U/L (ref 38–126)
Anion gap: 7 (ref 5–15)
BUN: 19 mg/dL (ref 8–23)
CO2: 27 mmol/L (ref 22–32)
Calcium: 9.6 mg/dL (ref 8.9–10.3)
Chloride: 96 mmol/L — ABNORMAL LOW (ref 98–111)
Creatinine, Ser: 0.92 mg/dL (ref 0.44–1.00)
GFR, Estimated: 60 mL/min — ABNORMAL LOW (ref >60)
Glucose, Bld: 147 mg/dL — ABNORMAL HIGH (ref 70–99)
Potassium: 4.1 mmol/L (ref 3.5–5.1)
Sodium: 130 mmol/L — ABNORMAL LOW (ref 135–145)
Total Bilirubin: 0.3 mg/dL (ref 0.3–1.2)
Total Protein: 6.6 g/dL (ref 6.5–8.1)

## 2021-04-19 LAB — CBC WITH DIFFERENTIAL/PLATELET
Abs Immature Granulocytes: 0.03 10*3/uL (ref 0.00–0.07)
Basophils Absolute: 0.1 10*3/uL (ref 0.0–0.1)
Basophils Relative: 1 %
Eosinophils Absolute: 0.2 10*3/uL (ref 0.0–0.5)
Eosinophils Relative: 2 %
HCT: 39.1 % (ref 36.0–46.0)
Hemoglobin: 12.9 g/dL (ref 12.0–15.0)
Immature Granulocytes: 0 %
Lymphocytes Relative: 22 %
Lymphs Abs: 1.8 10*3/uL (ref 0.7–4.0)
MCH: 29.5 pg (ref 26.0–34.0)
MCHC: 33 g/dL (ref 30.0–36.0)
MCV: 89.3 fL (ref 80.0–100.0)
Monocytes Absolute: 0.6 10*3/uL (ref 0.1–1.0)
Monocytes Relative: 8 %
Neutro Abs: 5.4 10*3/uL (ref 1.7–7.7)
Neutrophils Relative %: 67 %
Platelets: 336 10*3/uL (ref 150–400)
RBC: 4.38 MIL/uL (ref 3.87–5.11)
RDW: 13.1 % (ref 11.5–15.5)
WBC: 8.1 10*3/uL (ref 4.0–10.5)
nRBC: 0 % (ref 0.0–0.2)

## 2021-04-20 LAB — CANCER ANTIGEN 27.29: CA 27.29: 33.3 U/mL (ref 0.0–38.6)

## 2021-04-25 NOTE — Progress Notes (Signed)
[facility] [address] [place] [address]   Patient Care Team: Asencion Noble, MD as PCP - General (Internal Medicine)  SUMMARY OF ONCOLOGIC HISTORY: Oncology History  Invasive ductal carcinoma of left breast ([place])  01/27/2014 Initial Diagnosis   Invasive ductal carcinoma of left breast ([place])   06/26/2020 Genetic Testing   Negative genetic testing: no pathogenic variants detected in [facility] Common Hereditary Cancers Panel.  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  The report date is June 26, 2020.   The Common Hereditary Cancers Panel offered by [facility] includes sequencing and/or deletion duplication testing of the following 48 genes: APC, ATM, AXIN2, BARD1, BMPR1A, BRCA1, BRCA2, BRIP1, CDH1, CDK4, CDKN2A (p14ARF), CDKN2A (p16INK4a), CHEK2, CTNNA1, DICER1, EPCAM (Deletion/duplication testing only), GREM1 (promoter region deletion/duplication testing only), KIT, MEN1, MLH1, MSH2, MSH3, MSH6, MUTYH, NBN, NF1, NHTL1, PALB2, PDGFRA, PMS2, POLD1, POLE, PTEN, RAD50, RAD51C, RAD51D, RNF43, SDHB, SDHC, SDHD, SMAD4, SMARCA4. STK11, TP53, TSC1, TSC2, and VHL.  The following genes were evaluated for sequence changes only: SDHA and HOXB13 c.251G>A variant only.     CHIEF COMPLIANT: Follow-up for bilateral breast cancer   INTERVAL HISTORY: [name] is a 87 y.o. female here today for follow up of her bilateral breast cancer. Her last visit was on 10/14/2020.   Today she reports feeling good.   REVIEW OF SYSTEMS:   Review of Systems  Constitutional:  Negative for appetite change and fatigue.  Genitourinary:  Positive for frequency.   All other systems reviewed and are negative.  I have reviewed the past medical history, past surgical history, social history and family history with the patient and they are unchanged from previous note.   ALLERGIES:   is allergic to codeine.   MEDICATIONS:  Current Outpatient  Medications  Medication Sig Dispense Refill   acetaminophen (TYLENOL) 500 MG tablet Take 500 mg by mouth every 6 (six) hours as needed for moderate pain or headache.     amLODipine (NORVASC) 5 MG tablet Take 5 mg by mouth at bedtime.      aspirin 81 MG chewable tablet Chew 81 mg by mouth at bedtime.     cyclobenzaprine (FLEXERIL) 5 MG tablet Take 5 mg by mouth at bedtime as needed. (Patient not taking: No sig reported)     diphenhydramine-acetaminophen (TYLENOL PM) 25-500 MG TABS Take 1 tablet by mouth at bedtime.      gabapentin (NEURONTIN) 300 MG capsule Take 300 mg by mouth 3 (three) times daily as needed (pain).      HYDROcodone-acetaminophen (NORCO) 5-325 MG tablet Take 1 tablet by mouth every 6 (six) hours as needed for severe pain. (Patient not taking: No sig reported) 10 tablet 0   HYDROcodone-acetaminophen (NORCO) 5-325 MG tablet Take 1 tablet by mouth every 6 (six) hours as needed for moderate pain. 20 tablet 0   lisinopril-hydrochlorothiazide (PRINZIDE,ZESTORETIC) 20-25 MG per tablet Take 1 tablet by mouth daily.      metFORMIN (GLUCOPHAGE-XR) 500 MG 24 hr tablet Take 500 mg by mouth 2 (two) times daily.      Multiple Vitamins-Minerals (CENTRUM SILVER PO) Take 1 capsule by mouth daily.     nitrofurantoin, macrocrystal-monohydrate, (MACROBID) 100 MG capsule Take 100 mg by mouth daily.     Polyethyl Glycol-Propyl Glycol (SYSTANE OP) Place 1 drop into both eyes daily as needed (dry eyes).     polyethylene glycol (MIRALAX / GLYCOLAX) packet Take 17 g by mouth at bedtime.  tiZANidine (ZANAFLEX) 4 MG tablet Take 1 tablet (4 mg total) by mouth every 6 (six) hours as needed for muscle spasms. 30 tablet 0   traMADol (ULTRAM) 50 MG tablet Take 1 tablet (50 mg total) by mouth every 6 (six) hours as needed. (Patient not taking: No sig reported) 12 tablet 0   No current facility-administered medications for this visit.     PHYSICAL EXAMINATION: Performance status (ECOG): 1 - Symptomatic but  completely ambulatory  There were no vitals filed for this visit. Wt Readings from Last 3 Encounters:  03/04/21 143 lb (64.9 kg)  01/28/21 143 lb (64.9 kg)  01/21/21 143 lb 11.8 oz (65.2 kg)   Physical Exam Vitals reviewed.  Constitutional:      Appearance: Normal appearance.  Cardiovascular:     Rate and Rhythm: Normal rate and regular rhythm.     Pulses: Normal pulses.     Heart sounds: Normal heart sounds.  Pulmonary:     Effort: Pulmonary effort is normal.     Breath sounds: Normal breath sounds.  Chest:  Breasts:    Right: Absent. No mass, skin change (within normal limits) or tenderness.     Left: Absent. No mass, skin change (within normal limits) or tenderness.  Abdominal:     Palpations: Abdomen is soft. There is no hepatomegaly, splenomegaly or mass.     Tenderness: There is no abdominal tenderness.  Lymphadenopathy:     Upper Body:     Right upper body: No supraclavicular, axillary or pectoral adenopathy.     Left upper body: No supraclavicular, axillary or pectoral adenopathy.  Neurological:     General: No focal deficit present.     Mental Status: She is alert and oriented to person, place, and time.  Psychiatric:        Mood and Affect: Mood normal.        Behavior: Behavior normal.    Breast Exam Chaperone: Thana Ates     LABORATORY DATA:  I have reviewed the data as listed CMP Latest Ref Rng & Units 04/19/2021 01/14/2021 10/07/2020  Glucose 70 - 99 mg/dL 147(H) 177(H) 162(H)  BUN 8 - 23 mg/dL _0 Creatinine 0.44 - 1.00 mg/dL 0.92 0.61 0.63  Sodium 135 - 145 mmol/L 130(L) 132(L) 133(L)  Potassium 3.5 - 5.1 mmol/L 4.1 3.8 3.9  Chloride 98 - 111 mmol/L 96(L) 97(L) 96(L)  CO2 22 - 32 mmol/L _1 Calcium 8.9 - 10.3 mg/dL 9.6 9.9 10.1  Total Protein 6.5 - 8.1 g/dL 6.6 6.8 6.8  Total Bilirubin 0.3 - 1.2 mg/dL 0.3 0.5 0.4  Alkaline Phos 38 - 126 U/L 79 77 85  AST 15 - 41 U/L _2 ALT 0 - 44 U/L _3 Lab Results  Component Value  Date   CAN153 34.8 (H) 01/14/2021   CAN153 31.4 (H) 10/07/2020   CAN153 30.7 (H) 06/16/2020   Lab Results  Component Value Date   WBC 8.1 04/19/2021   HGB 12.9 04/19/2021   HCT 39.1 04/19/2021   MCV 89.3 04/19/2021   PLT 336 04/19/2021   NEUTROABS 5.4 04/19/2021    ASSESSMENT:  1.  Right breast TNBC: -Right mastectomy on 05/15/2020. -Invasive lobular carcinoma, multifocal, 1.3 cm in greatest dimension, grade 2, margins negative, 0/4 lymph nodes involved, Ki-67: 50%, ER/PR negative, HER-2 IHC 1+, FISH negative, PT 1 CPN 0 -CT CAP on 07/06/2020 did not show any evidence of metastatic disease. -Bone scan  on 06/22/2020 was negative for metastatic disease.   2.  Stage I left breast cancer ER/PR positive and HER-2 negative: - She had mastectomy in 2012. - Mammogram reviewed by me on 03/28/2018 was BI-RADS Category 1 of the right breast. -She apparently did not take any antiestrogen therapy.   3.  Family history: -Mother had colon cancer.  Mother and sister had breast cancer. -Father had lung cancer.   PLAN:  1.  T1CN0 invasive lobular carcinoma right breast, ER/PR/HER-2 negative: - Bilateral mastectomy sites are within normal limits.  No suspicious nodules.  No adenopathy palpable. - Labs from 04/19/2021 shows CBC was normal.  LFTs were normal.  CA 27-29 was normal. - RTC 6 months for follow-up with repeat labs and physical exam.   2.  Family history: - [facility] genetic testing was negative.   3.  Pancreatic cysts: - Last MRI of the pancreatic protocol was done on 01/14/2021.  Lesions are consistent with indolent cystic neoplasm such as IPMN's.  CA 19-9 was normal. - We will plan on repeating MRI abdomen with and without contrast in August 2023.  Breast Cancer therapy associated bone loss: I have recommended calcium, Vitamin D and weight bearing exercises.  Orders placed this encounter:  No orders of the defined types were placed in this encounter.   The patient has a good  understanding of the overall plan. She agrees with it. She will call with any problems that may develop before the next visit here.  Derek Jack, MD [facility] [phone]   I, Thana Ates, am acting as a scribe for Dr. Derek Jack.  I, Derek Jack MD, have reviewed the above documentation for accuracy and completeness, and I agree with the above.

## 2021-04-26 ENCOUNTER — Inpatient Hospital Stay (HOSPITAL_BASED_OUTPATIENT_CLINIC_OR_DEPARTMENT_OTHER): Payer: Medicare Other | Admitting: Hematology

## 2021-04-26 ENCOUNTER — Other Ambulatory Visit: Payer: Self-pay

## 2021-04-26 VITALS — BP 142/78 | HR 82 | Temp 97.8°F | Resp 18

## 2021-04-26 DIAGNOSIS — Z801 Family history of malignant neoplasm of trachea, bronchus and lung: Secondary | ICD-10-CM | POA: Diagnosis not present

## 2021-04-26 DIAGNOSIS — Z803 Family history of malignant neoplasm of breast: Secondary | ICD-10-CM | POA: Diagnosis not present

## 2021-04-26 DIAGNOSIS — C50912 Malignant neoplasm of unspecified site of left female breast: Secondary | ICD-10-CM | POA: Diagnosis not present

## 2021-04-26 DIAGNOSIS — Z170 Estrogen receptor positive status [ER+]: Secondary | ICD-10-CM | POA: Diagnosis not present

## 2021-04-26 DIAGNOSIS — C50911 Malignant neoplasm of unspecified site of right female breast: Secondary | ICD-10-CM | POA: Diagnosis not present

## 2021-04-26 DIAGNOSIS — K862 Cyst of pancreas: Secondary | ICD-10-CM | POA: Diagnosis not present

## 2021-04-26 NOTE — Patient Instructions (Signed)
[place] at [facility] Discharge Instructions  You were seen and examined today by Dr. Delton Coombes. He reviewed your most recent labs and everything looks good except your sodium was slightly low. This may be coming from one of the medications you are taking but is nothing to be concerned about. Dr. Delton Coombes discussed the pancreatic cysts that was seen on your scan. They appear to be benign but we will monitor this with scans going forward.  Please follow up as scheduled  in 6 months.   Thank you for choosing [place] at [facility] to provide your oncology and hematology care.  To afford each patient quality time with our provider, please arrive at least 15 minutes before your scheduled appointment time.   If you have a lab appointment with the [facility] please come in thru the Main Entrance and check in at the main information desk.  You need to re-schedule your appointment should you arrive 10 or more minutes late.  We strive to give you quality time with our providers, and arriving late affects you and other patients whose appointments are after yours.  Also, if you no show three or more times for appointments you may be dismissed from the clinic at the providers discretion.     Again, thank you for choosing [facility].  Our hope is that these requests will decrease the amount of time that you wait before being seen by our physicians.       _____________________________________________________________  Should you have questions after your visit to [facility], please contact our office at [phone] and follow the prompts.  Our office hours are 8:00 a.m. and 4:30 p.m. Monday - Friday.  Please note that voicemails left after 4:00 p.m. may not be returned until the following business day.  We are closed weekends and major holidays.  You do have access to a nurse 24-7, just call the main number to the clinic  [phone] and do not press any options, hold on the line and a nurse will answer the phone.    For prescription refill requests, have your pharmacy contact our office and allow 72 hours.    Due to Covid, you will need to wear a mask upon entering the hospital. If you do not have a mask, a mask will be given to you at the Main Entrance upon arrival. For doctor visits, patients may have 1 support person age 63 or older with them. For treatment visits, patients can not have anyone with them due to social distancing guidelines and our immunocompromised population.

## 2021-05-04 ENCOUNTER — Encounter: Payer: Self-pay | Admitting: Adult Health

## 2021-05-04 ENCOUNTER — Other Ambulatory Visit: Payer: Self-pay

## 2021-05-04 ENCOUNTER — Ambulatory Visit (INDEPENDENT_AMBULATORY_CARE_PROVIDER_SITE_OTHER): Payer: Medicare Other | Admitting: Adult Health

## 2021-05-04 VITALS — BP 133/78 | HR 102 | Ht 65.0 in | Wt 143.4 lb

## 2021-05-04 DIAGNOSIS — Z1211 Encounter for screening for malignant neoplasm of colon: Secondary | ICD-10-CM | POA: Diagnosis not present

## 2021-05-04 DIAGNOSIS — Z8719 Personal history of other diseases of the digestive system: Secondary | ICD-10-CM

## 2021-05-04 LAB — HEMOCCULT GUIAC POC 1CARD (OFFICE): Fecal Occult Blood, POC: NEGATIVE

## 2021-05-04 NOTE — Progress Notes (Signed)
  Subjective:     Patient ID: Christine Sutton, female   DOB: 1930-05-26, 87 y.o.   MRN: [id number]  HPI Christine Sutton is a 87 year old white female, widowed, sp hysterectomy in complaining of having had rectal bleeding, but not now. She had breast cancer again and had right breast removed last year, 05/15/20. PCP is Dr Willey Blade.   Review of Systems Had rectal bleeding, no pain and none now Has some constipation and uses miralax Has chronic back pain  Has to pee 3-4 x at night, but does not want meds Reviewed past medical,surgical, social and family history. Reviewed medications and allergies.     Objective:   Physical Exam BP 133/78 (BP Location: Left Arm, Patient Position: Sitting, Cuff Size: Normal)   Pulse (!) 102   Ht 5\' 5"  (1.651 m)   Wt 143 lb 6.4 oz (65 kg)   BMI 23.86 kg/m     Skin warm and dry. Lungs: clear to ausculation bilaterally. Cardiovascular: regular rate and rhythm.  On rectal exam, has good tone, no masses or hemorrhoids felt, has mild rectocele, and hemoccult was negative.  August Luz NP student chaperoned exam.  Assessment:     1. History of rectal bleeding Has resolved  2. Encounter for screening fecal occult blood testing Was negative    Plan:       Follow up prn

## 2021-06-11 DIAGNOSIS — E1129 Type 2 diabetes mellitus with other diabetic kidney complication: Secondary | ICD-10-CM | POA: Diagnosis not present

## 2021-06-18 DIAGNOSIS — I10 Essential (primary) hypertension: Secondary | ICD-10-CM | POA: Diagnosis not present

## 2021-06-18 DIAGNOSIS — Z87440 Personal history of urinary (tract) infections: Secondary | ICD-10-CM | POA: Diagnosis not present

## 2021-06-18 DIAGNOSIS — R7309 Other abnormal glucose: Secondary | ICD-10-CM | POA: Diagnosis not present

## 2021-06-18 DIAGNOSIS — E1122 Type 2 diabetes mellitus with diabetic chronic kidney disease: Secondary | ICD-10-CM | POA: Diagnosis not present

## 2021-07-11 DIAGNOSIS — E785 Hyperlipidemia, unspecified: Secondary | ICD-10-CM | POA: Diagnosis not present

## 2021-07-11 DIAGNOSIS — E119 Type 2 diabetes mellitus without complications: Secondary | ICD-10-CM | POA: Diagnosis not present

## 2021-07-11 DIAGNOSIS — I10 Essential (primary) hypertension: Secondary | ICD-10-CM | POA: Diagnosis not present

## 2021-08-10 DIAGNOSIS — E785 Hyperlipidemia, unspecified: Secondary | ICD-10-CM | POA: Diagnosis not present

## 2021-08-10 DIAGNOSIS — I10 Essential (primary) hypertension: Secondary | ICD-10-CM | POA: Diagnosis not present

## 2021-08-12 DIAGNOSIS — U071 COVID-19: Secondary | ICD-10-CM | POA: Diagnosis not present

## 2021-08-12 DIAGNOSIS — I10 Essential (primary) hypertension: Secondary | ICD-10-CM | POA: Diagnosis not present

## 2021-10-18 ENCOUNTER — Inpatient Hospital Stay (HOSPITAL_COMMUNITY): Payer: Medicare Other | Attending: Hematology

## 2021-10-18 DIAGNOSIS — K862 Cyst of pancreas: Secondary | ICD-10-CM | POA: Diagnosis not present

## 2021-10-18 DIAGNOSIS — Z800 Family history of malignant neoplasm of digestive organs: Secondary | ICD-10-CM | POA: Insufficient documentation

## 2021-10-18 DIAGNOSIS — C50911 Malignant neoplasm of unspecified site of right female breast: Secondary | ICD-10-CM

## 2021-10-18 DIAGNOSIS — Z803 Family history of malignant neoplasm of breast: Secondary | ICD-10-CM | POA: Diagnosis not present

## 2021-10-18 DIAGNOSIS — Z801 Family history of malignant neoplasm of trachea, bronchus and lung: Secondary | ICD-10-CM | POA: Diagnosis not present

## 2021-10-18 DIAGNOSIS — C50912 Malignant neoplasm of unspecified site of left female breast: Secondary | ICD-10-CM

## 2021-10-18 DIAGNOSIS — Z853 Personal history of malignant neoplasm of breast: Secondary | ICD-10-CM | POA: Insufficient documentation

## 2021-10-18 LAB — CBC WITH DIFFERENTIAL/PLATELET
Abs Immature Granulocytes: 0.03 10*3/uL (ref 0.00–0.07)
Basophils Absolute: 0.1 10*3/uL (ref 0.0–0.1)
Basophils Relative: 1 %
Eosinophils Absolute: 0.2 10*3/uL (ref 0.0–0.5)
Eosinophils Relative: 2 %
HCT: 38.8 % (ref 36.0–46.0)
Hemoglobin: 12.8 g/dL (ref 12.0–15.0)
Immature Granulocytes: 0 %
Lymphocytes Relative: 21 %
Lymphs Abs: 1.6 10*3/uL (ref 0.7–4.0)
MCH: 29 pg (ref 26.0–34.0)
MCHC: 33 g/dL (ref 30.0–36.0)
MCV: 87.8 fL (ref 80.0–100.0)
Monocytes Absolute: 0.5 10*3/uL (ref 0.1–1.0)
Monocytes Relative: 7 %
Neutro Abs: 5.1 10*3/uL (ref 1.7–7.7)
Neutrophils Relative %: 69 %
Platelets: 305 10*3/uL (ref 150–400)
RBC: 4.42 MIL/uL (ref 3.87–5.11)
RDW: 14.1 % (ref 11.5–15.5)
WBC: 7.4 10*3/uL (ref 4.0–10.5)
nRBC: 0 % (ref 0.0–0.2)

## 2021-10-18 LAB — COMPREHENSIVE METABOLIC PANEL
ALT: 22 U/L (ref 0–44)
AST: 20 U/L (ref 15–41)
Albumin: 4 g/dL (ref 3.5–5.0)
Alkaline Phosphatase: 84 U/L (ref 38–126)
Anion gap: 8 (ref 5–15)
BUN: 24 mg/dL — ABNORMAL HIGH (ref 8–23)
CO2: 27 mmol/L (ref 22–32)
Calcium: 9.8 mg/dL (ref 8.9–10.3)
Chloride: 100 mmol/L (ref 98–111)
Creatinine, Ser: 0.8 mg/dL (ref 0.44–1.00)
GFR, Estimated: >60 mL/min (ref >60)
Glucose, Bld: 193 mg/dL — ABNORMAL HIGH (ref 70–99)
Potassium: 3.8 mmol/L (ref 3.5–5.1)
Sodium: 135 mmol/L (ref 135–145)
Total Bilirubin: 0.5 mg/dL (ref 0.3–1.2)
Total Protein: 6.7 g/dL (ref 6.5–8.1)

## 2021-10-19 DIAGNOSIS — E119 Type 2 diabetes mellitus without complications: Secondary | ICD-10-CM | POA: Diagnosis not present

## 2021-10-19 DIAGNOSIS — Z79899 Other long term (current) drug therapy: Secondary | ICD-10-CM | POA: Diagnosis not present

## 2021-10-19 DIAGNOSIS — I10 Essential (primary) hypertension: Secondary | ICD-10-CM | POA: Diagnosis not present

## 2021-10-19 LAB — CANCER ANTIGEN 15-3: CA 15-3: 30.5 U/mL — ABNORMAL HIGH (ref 0.0–25.0)

## 2021-10-25 ENCOUNTER — Inpatient Hospital Stay (HOSPITAL_BASED_OUTPATIENT_CLINIC_OR_DEPARTMENT_OTHER): Payer: Medicare Other | Admitting: Hematology

## 2021-10-25 VITALS — BP 149/65 | HR 63 | Temp 98.4°F | Resp 18 | Ht 65.0 in | Wt 146.0 lb

## 2021-10-25 DIAGNOSIS — Z800 Family history of malignant neoplasm of digestive organs: Secondary | ICD-10-CM | POA: Diagnosis not present

## 2021-10-25 DIAGNOSIS — C50912 Malignant neoplasm of unspecified site of left female breast: Secondary | ICD-10-CM | POA: Diagnosis not present

## 2021-10-25 DIAGNOSIS — E1122 Type 2 diabetes mellitus with diabetic chronic kidney disease: Secondary | ICD-10-CM | POA: Diagnosis not present

## 2021-10-25 DIAGNOSIS — I10 Essential (primary) hypertension: Secondary | ICD-10-CM | POA: Diagnosis not present

## 2021-10-25 DIAGNOSIS — R7309 Other abnormal glucose: Secondary | ICD-10-CM | POA: Diagnosis not present

## 2021-10-25 DIAGNOSIS — N3941 Urge incontinence: Secondary | ICD-10-CM | POA: Diagnosis not present

## 2021-10-25 DIAGNOSIS — K862 Cyst of pancreas: Secondary | ICD-10-CM

## 2021-10-25 DIAGNOSIS — R978 Other abnormal tumor markers: Secondary | ICD-10-CM

## 2021-10-25 DIAGNOSIS — Z801 Family history of malignant neoplasm of trachea, bronchus and lung: Secondary | ICD-10-CM | POA: Diagnosis not present

## 2021-10-25 DIAGNOSIS — Z853 Personal history of malignant neoplasm of breast: Secondary | ICD-10-CM | POA: Diagnosis not present

## 2021-10-25 DIAGNOSIS — C50911 Malignant neoplasm of unspecified site of right female breast: Secondary | ICD-10-CM | POA: Diagnosis not present

## 2021-10-25 DIAGNOSIS — Z803 Family history of malignant neoplasm of breast: Secondary | ICD-10-CM | POA: Diagnosis not present

## 2021-10-25 NOTE — Patient Instructions (Addendum)
[place] at [facility] ?Discharge Instructions ? ?You were seen and examined today by Dr. Delton Coombes. ? ?Dr. Delton Coombes discussed your most recent lab work and sodium is slightly low but this can be caused when your sugar runs high. All other labs look good. ? ?Dr. Delton Coombes is ordering repeat MRI of abdomen. ? ?Follow-up as scheduled in 6 months. ? ? ? ?Thank you for choosing [place] at [facility] to provide your oncology and hematology care.  To afford each patient quality time with our provider, please arrive at least 15 minutes before your scheduled appointment time.  ? ?If you have a lab appointment with the [facility] please come in thru the Main Entrance and check in at the main information desk. ? ?You need to re-schedule your appointment should you arrive 10 or more minutes late.  We strive to give you quality time with our providers, and arriving late affects you and other patients whose appointments are after yours.  Also, if you no show three or more times for appointments you may be dismissed from the clinic at the providers discretion.     ?Again, thank you for choosing [facility].  Our hope is that these requests will decrease the amount of time that you wait before being seen by our physicians.       ?_____________________________________________________________ ? ?Should you have questions after your visit to [facility], please contact our office at [phone] and follow the prompts.  Our office hours are 8:00 a.m. and 4:30 p.m. Monday - Friday.  Please note that voicemails left after 4:00 p.m. may not be returned until the following business day.  We are closed weekends and major holidays.  You do have access to a nurse 24-7, just call the main number to the clinic [phone] and do not press any options, hold on the line and a nurse will answer the phone.   ? ?For prescription refill requests, have your  pharmacy contact our office and allow 72 hours.   ? ?Due to Covid, you will need to wear a mask upon entering the hospital. If you do not have a mask, a mask will be given to you at the Main Entrance upon arrival. For doctor visits, patients may have 1 support person age 86 or older with them. For treatment visits, patients can not have anyone with them due to social distancing guidelines and our immunocompromised population.  ? ?  ?

## 2021-10-25 NOTE — Progress Notes (Signed)
? ?[facility] ?[address] ?[place], [place] [address] ? ? ?Patient Care Team: ?Asencion Noble, MD as PCP - General (Internal Medicine) ? ?SUMMARY OF ONCOLOGIC HISTORY: ?Oncology History  ?Invasive ductal carcinoma of left breast ([place])  ?01/27/2014 Initial Diagnosis  ? Invasive ductal carcinoma of left breast [facility]) ?  ?06/26/2020 Genetic Testing  ? Negative genetic testing: no pathogenic variants detected in [facility] Common Hereditary Cancers Panel.  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  The report date is June 26, 2020.  ? ?The Common Hereditary Cancers Panel offered by [facility] includes sequencing and/or deletion duplication testing of the following 48 genes: APC, ATM, AXIN2, BARD1, BMPR1A, BRCA1, BRCA2, BRIP1, CDH1, CDK4, CDKN2A (p14ARF), CDKN2A (p16INK4a), CHEK2, CTNNA1, DICER1, EPCAM (Deletion/duplication testing only), GREM1 (promoter region deletion/duplication testing only), KIT, MEN1, MLH1, MSH2, MSH3, MSH6, MUTYH, NBN, NF1, NHTL1, PALB2, PDGFRA, PMS2, POLD1, POLE, PTEN, RAD50, RAD51C, RAD51D, RNF43, SDHB, SDHC, SDHD, SMAD4, SMARCA4. STK11, TP53, TSC1, TSC2, and VHL.  The following genes were evaluated for sequence changes only: SDHA and HOXB13 c.251G>A variant only. ?  ? ? ?CHIEF COMPLIANT: Follow-up for bilateral breast cancer ? ? ?INTERVAL HISTORY: Christine Sutton is a 88 y.o. female here today for follow up of her bilateral breast cancer. Her last visit was on 04/26/2021.  ? ?Today she reports feeling good. She started taking vitamin D on 5/1, and she denies taking calcium. She denies new pains and reports stable back pain.  ? ?REVIEW OF SYSTEMS:   ?Review of Systems  ?Constitutional:  Negative for appetite change and fatigue.  ?Gastrointestinal:  Positive for constipation.  ?Musculoskeletal:  Positive for back pain (6/10).  ?All other systems reviewed and are negative. ? ?I have reviewed the past medical history, past surgical history, social  history and family history with the patient and they are unchanged from previous note. ? ? ?ALLERGIES:   ?is allergic to codeine. ? ? ?MEDICATIONS:  ?Current Outpatient Medications  ?Medication Sig Dispense Refill  ? acetaminophen (TYLENOL) 500 MG tablet Take 500 mg by mouth every 6 (six) hours as needed for moderate pain or headache.    ? amLODipine (NORVASC) 5 MG tablet Take 5 mg by mouth at bedtime.     ? aspirin 81 MG chewable tablet Chew 81 mg by mouth at bedtime.    ? diphenhydramine-acetaminophen (TYLENOL PM) 25-500 MG TABS Take 1 tablet by mouth at bedtime.     ? gabapentin (NEURONTIN) 300 MG capsule Take 300 mg by mouth 3 (three) times daily as needed (pain).    ? lisinopril-hydrochlorothiazide (PRINZIDE,ZESTORETIC) 20-25 MG per tablet Take 1 tablet by mouth daily.     ? metFORMIN (GLUCOPHAGE-XR) 500 MG 24 hr tablet Take 500 mg by mouth 2 (two) times daily.     ? Multiple Vitamins-Minerals (CENTRUM SILVER PO) Take 1 capsule by mouth daily.    ? nitrofurantoin, macrocrystal-monohydrate, (MACROBID) 100 MG capsule Take 100 mg by mouth daily.    ? Polyethyl Glycol-Propyl Glycol (SYSTANE OP) Place 1 drop into both eyes daily as needed (dry eyes).    ? polyethylene glycol (MIRALAX / GLYCOLAX) packet Take 17 g by mouth at bedtime.     ? ?No current facility-administered medications for this visit.  ? ? ? ?PHYSICAL EXAMINATION: ?Performance status (ECOG): 1 - Symptomatic but completely ambulatory ? ?Vitals:  ? 10/25/21 1534  ?BP: (!) 149/65  ?Pulse: 63  ?Resp: 18  ?Temp: 98.4 ?F (36.9 ?C)  ?SpO2: 97%  ? ?  Wt Readings from Last 3 Encounters:  ?10/25/21 146 lb (66.2 kg)  ?05/04/21 143 lb 6.4 oz (65 kg)  ?03/04/21 143 lb (64.9 kg)  ? ?Physical Exam ?Vitals reviewed.  ?Constitutional:   ?   Appearance: Normal appearance.  ?Cardiovascular:  ?   Rate and Rhythm: Normal rate and regular rhythm.  ?   Pulses: Normal pulses.  ?   Heart sounds: Normal heart sounds.  ?Pulmonary:  ?   Effort: Pulmonary effort is normal.  ?    Breath sounds: Normal breath sounds.  ?Chest:  ?Breasts: ?   Right: Absent. No swelling, bleeding, mass, skin change (mastectomy site WNL) or tenderness.  ?   Left: Absent. No swelling, bleeding, mass, skin change (mastectomy site WNL) or tenderness.  ?Lymphadenopathy:  ?   Upper Body:  ?   Right upper body: No supraclavicular, axillary or pectoral adenopathy.  ?   Left upper body: No supraclavicular, axillary or pectoral adenopathy.  ?Neurological:  ?   General: No focal deficit present.  ?   Mental Status: She is alert and oriented to person, place, and time.  ?Psychiatric:     ?   Mood and Affect: Mood normal.     ?   Behavior: Behavior normal.  ? ? ?Breast Exam Chaperone: Thana Ates   ? ? ?LABORATORY DATA:  ?I have reviewed the data as listed ? ?  Latest Ref Rng & Units 10/18/2021  ?  1:49 PM 04/19/2021  ? 12:07 PM 01/14/2021  ? 10:30 AM  ?CMP  ?Glucose 70 - 99 mg/dL 193   147   177    ?BUN 8 - 23 mg/dL 24   19   16     ?Creatinine 0.44 - 1.00 mg/dL 0.80   0.92   0.61    ?Sodium 135 - 145 mmol/L 135   130   132    ?Potassium 3.5 - 5.1 mmol/L 3.8   4.1   3.8    ?Chloride 98 - 111 mmol/L 100   96   97    ?CO2 22 - 32 mmol/L 27   27   27     ?Calcium 8.9 - 10.3 mg/dL 9.8   9.6   9.9    ?Total Protein 6.5 - 8.1 g/dL 6.7   6.6   6.8    ?Total Bilirubin 0.3 - 1.2 mg/dL 0.5   0.3   0.5    ?Alkaline Phos 38 - 126 U/L 84   79   77    ?AST 15 - 41 U/L 20   16   22     ?ALT 0 - 44 U/L 22   18   23     ? ?Lab Results  ?Component Value Date  ? EUM353 30.5 (H) 10/18/2021  ? IRW431 34.8 (H) 01/14/2021  ? VQM086 31.4 (H) 10/07/2020  ? ?Lab Results  ?Component Value Date  ? WBC 7.4 10/18/2021  ? HGB 12.8 10/18/2021  ? HCT 38.8 10/18/2021  ? MCV 87.8 10/18/2021  ? PLT 305 10/18/2021  ? NEUTROABS 5.1 10/18/2021  ? ? ?ASSESSMENT:  ?1.  Right breast TNBC: ?-Right mastectomy on 05/15/2020. ?-Invasive lobular carcinoma, multifocal, 1.3 cm in greatest dimension, grade 2, margins negative, 0/4 lymph nodes involved, Ki-67: 50%, ER/PR negative,  HER-2 IHC 1+, FISH negative, PT 1 CPN 0 ?-CT CAP on 07/06/2020 did not show any evidence of metastatic disease. ?-Bone scan on 06/22/2020 was negative for metastatic disease. ?  ?2.  Stage I left breast cancer  ER/PR positive and HER-2 negative: ?- She had mastectomy in 2012. ?- Mammogram reviewed by me on 03/28/2018 was BI-RADS Category 1 of the right breast. ?-She apparently did not take any antiestrogen therapy. ?  ?3.  Family history: ?-Mother had colon cancer.  Mother and sister had breast cancer. ?-Father had lung cancer. ? ? ?PLAN:  ?1.  T1CN0 invasive lobular carcinoma right breast, ER/PR/HER-2 negative: ?- Bilateral mastectomy site is within normal limits with no suspicious nodules.  No adenopathy palpable. ?- Labs from 10/18/2021 shows normal LFTs and CBC.  CA 15-3 was 30. ?- RTC 6 months for follow-up with repeat labs. ?  ?2.  Family history: ?- [facility] genetic testing was negative. ?  ?3.  Pancreatic cysts: ?- Last MRI on 01/14/2021 showed indolent cystic neoplasm. ?- We will plan to repeat MRI of the abdomen with and without contrast pancreatic protocol prior to next visit. ? ?Breast Cancer therapy associated bone loss: I have recommended calcium, Vitamin D and weight bearing exercises. ? ?Orders placed this encounter:  ?No orders of the defined types were placed in this encounter. ? ? ?The patient has a good understanding of the overall plan. She agrees with it. She will call with any problems that may develop before the next visit here. ? ?Derek Jack, MD ?[place] ?[phone] ? ? ?I, Thana Ates, am acting as a scribe for Dr. Derek Jack. ? ?I, Derek Jack MD, have reviewed the above documentation for accuracy and completeness, and I agree with the above. ?  ? ? ?

## 2021-10-26 NOTE — Addendum Note (Signed)
Addended by: Brien Mates on: 10/26/2021 08:24 AM ? ? Modules accepted: Orders ? ?

## 2021-12-28 DIAGNOSIS — I10 Essential (primary) hypertension: Secondary | ICD-10-CM | POA: Diagnosis not present

## 2021-12-28 DIAGNOSIS — K862 Cyst of pancreas: Secondary | ICD-10-CM | POA: Diagnosis not present

## 2022-01-10 DIAGNOSIS — E119 Type 2 diabetes mellitus without complications: Secondary | ICD-10-CM | POA: Diagnosis not present

## 2022-01-10 DIAGNOSIS — E785 Hyperlipidemia, unspecified: Secondary | ICD-10-CM | POA: Diagnosis not present

## 2022-01-10 DIAGNOSIS — I10 Essential (primary) hypertension: Secondary | ICD-10-CM | POA: Diagnosis not present

## 2022-01-11 ENCOUNTER — Inpatient Hospital Stay: Payer: Medicare Other | Attending: Hematology

## 2022-01-11 ENCOUNTER — Other Ambulatory Visit (HOSPITAL_COMMUNITY): Payer: Self-pay | Admitting: Hematology

## 2022-01-11 ENCOUNTER — Ambulatory Visit (HOSPITAL_COMMUNITY)
Admission: RE | Admit: 2022-01-11 | Discharge: 2022-01-11 | Disposition: A | Discharge status: Home / Self Care | Payer: Medicare Other | Source: Ambulatory Visit | Attending: Hematology | Admitting: Hematology

## 2022-01-11 DIAGNOSIS — K7689 Other specified diseases of liver: Secondary | ICD-10-CM | POA: Diagnosis not present

## 2022-01-11 DIAGNOSIS — N281 Cyst of kidney, acquired: Secondary | ICD-10-CM | POA: Diagnosis not present

## 2022-01-11 DIAGNOSIS — R978 Other abnormal tumor markers: Secondary | ICD-10-CM

## 2022-01-11 DIAGNOSIS — C50912 Malignant neoplasm of unspecified site of left female breast: Secondary | ICD-10-CM

## 2022-01-11 DIAGNOSIS — J90 Pleural effusion, not elsewhere classified: Secondary | ICD-10-CM | POA: Diagnosis not present

## 2022-01-11 DIAGNOSIS — C50911 Malignant neoplasm of unspecified site of right female breast: Secondary | ICD-10-CM | POA: Insufficient documentation

## 2022-01-11 DIAGNOSIS — R935 Abnormal findings on diagnostic imaging of other abdominal regions, including retroperitoneum: Secondary | ICD-10-CM | POA: Diagnosis not present

## 2022-01-11 DIAGNOSIS — D378 Neoplasm of uncertain behavior of other specified digestive organs: Secondary | ICD-10-CM | POA: Diagnosis not present

## 2022-01-11 DIAGNOSIS — K5730 Diverticulosis of large intestine without perforation or abscess without bleeding: Secondary | ICD-10-CM | POA: Insufficient documentation

## 2022-01-11 DIAGNOSIS — M419 Scoliosis, unspecified: Secondary | ICD-10-CM | POA: Diagnosis not present

## 2022-01-11 DIAGNOSIS — Z853 Personal history of malignant neoplasm of breast: Secondary | ICD-10-CM | POA: Diagnosis not present

## 2022-01-11 DIAGNOSIS — K862 Cyst of pancreas: Secondary | ICD-10-CM | POA: Insufficient documentation

## 2022-01-11 LAB — CBC WITH DIFFERENTIAL/PLATELET
Abs Immature Granulocytes: 0.05 10*3/uL (ref 0.00–0.07)
Basophils Absolute: 0.1 10*3/uL (ref 0.0–0.1)
Basophils Relative: 1 %
Eosinophils Absolute: 0.1 10*3/uL (ref 0.0–0.5)
Eosinophils Relative: 1 %
HCT: 25.3 % — ABNORMAL LOW (ref 36.0–46.0)
Hemoglobin: 8.3 g/dL — ABNORMAL LOW (ref 12.0–15.0)
Immature Granulocytes: 1 %
Lymphocytes Relative: 18 %
Lymphs Abs: 1.6 10*3/uL (ref 0.7–4.0)
MCH: 29.2 pg (ref 26.0–34.0)
MCHC: 32.8 g/dL (ref 30.0–36.0)
MCV: 89.1 fL (ref 80.0–100.0)
Monocytes Absolute: 0.6 10*3/uL (ref 0.1–1.0)
Monocytes Relative: 7 %
Neutro Abs: 6.5 10*3/uL (ref 1.7–7.7)
Neutrophils Relative %: 72 %
Platelets: 284 10*3/uL (ref 150–400)
RBC: 2.84 MIL/uL — ABNORMAL LOW (ref 3.87–5.11)
RDW: 13.6 % (ref 11.5–15.5)
WBC: 8.9 10*3/uL (ref 4.0–10.5)
nRBC: 0 % (ref 0.0–0.2)

## 2022-01-11 LAB — COMPREHENSIVE METABOLIC PANEL
ALT: 14 U/L (ref 0–44)
AST: 14 U/L — ABNORMAL LOW (ref 15–41)
Albumin: 3.6 g/dL (ref 3.5–5.0)
Alkaline Phosphatase: 56 U/L (ref 38–126)
Anion gap: 7 (ref 5–15)
BUN: 18 mg/dL (ref 8–23)
CO2: 27 mmol/L (ref 22–32)
Calcium: 9.4 mg/dL (ref 8.9–10.3)
Chloride: 98 mmol/L (ref 98–111)
Creatinine, Ser: 0.78 mg/dL (ref 0.44–1.00)
GFR, Estimated: >60 mL/min (ref >60)
Glucose, Bld: 191 mg/dL — ABNORMAL HIGH (ref 70–99)
Potassium: 4.2 mmol/L (ref 3.5–5.1)
Sodium: 132 mmol/L — ABNORMAL LOW (ref 135–145)
Total Bilirubin: 0.3 mg/dL (ref 0.3–1.2)
Total Protein: 5.9 g/dL — ABNORMAL LOW (ref 6.5–8.1)

## 2022-01-11 MED ORDER — GADOBUTROL 1 MMOL/ML IV SOLN
7.0000 mL | Freq: Once | INTRAVENOUS | Status: AC | PRN
  Administered 2022-01-11: 7 mL via INTRAVENOUS

## 2022-01-12 ENCOUNTER — Encounter: Payer: Self-pay | Admitting: Gastroenterology

## 2022-01-12 ENCOUNTER — Telehealth: Payer: Self-pay | Admitting: *Deleted

## 2022-01-12 ENCOUNTER — Ambulatory Visit (INDEPENDENT_AMBULATORY_CARE_PROVIDER_SITE_OTHER): Payer: Medicare Other | Admitting: Gastroenterology

## 2022-01-12 VITALS — BP 127/66 | HR 88 | Temp 97.7°F | Ht 65.5 in | Wt 141.8 lb

## 2022-01-12 DIAGNOSIS — K625 Hemorrhage of anus and rectum: Secondary | ICD-10-CM

## 2022-01-12 LAB — CANCER ANTIGEN 15-3: CA 15-3: 20.3 U/mL (ref 0.0–25.0)

## 2022-01-12 LAB — CANCER ANTIGEN 19-9: CA 19-9: <2 U/mL (ref 0–35)

## 2022-01-12 NOTE — Progress Notes (Signed)
Gastroenterology Office Note    Referring Provider: Asencion Noble, MD Primary Care Physician:  Asencion Noble, MD  Primary GI: Dr. Abbey Chatters    Chief Complaint   Chief Complaint  Patient presents with   Rectal Bleeding    Patient here today due to seeing bright red blood in stools ongoing since this past Saturday. Denies any shortness of breath, fatigue or dizziness.     History of Present Illness   HEMA LANZA is an 88 y.o. female presenting today at the request of Asencion Noble, MD due to rectal bleeding. Last colonoscopy in 2006 with pancolonic diverticulosis. Hgb yesterday was 8.3, which is down from 12.8 in May 2023. Son is present with her today. Mother with history of colon cancer.    Large amount of rectal bleeding started on Saturday. Had bleeding daily until today. Had daily bleeding on tissue. Large clots. Notes lower abdominal pain for at least a few months but didn't worsen with rectal bleeding. No postprandial abdominal pain. Takes Miralax each evening as needed. Small amount of blood this morning.   81 mg aspirin. No other NSAIDs. At times feels weak. Chronic issue. Lives alone. Independent. No nausea currently. Felt dizzy a few weeks ago and saw Dr. Willey Blade.   Mother: colon cancer.   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[place]) 01/27/2014   ER +    Past Surgical History:  Procedure Laterality Date   BACK SURGERY     lower   CATARACT EXTRACTION, BILATERAL  11/2014   MASTECTOMY Left    SENTINEL NODE BIOPSY Right 05/15/2020   Procedure: SENTINEL NODE BIOPSY;  Surgeon: Virl Cagey, MD;  Location: [facility];  Service: General;  Laterality: Right;   TOTAL MASTECTOMY Right 05/15/2020   Procedure:  TOTAL MASTECTOMY RIGHT BREAST;  Surgeon: Virl Cagey, MD;  Location: [facility];  Service: General;  Laterality: Right;    Current Outpatient Medications  Medication Sig Dispense Refill   acetaminophen (TYLENOL) 500 MG tablet Take 500 mg by mouth every 6 (six) hours as needed for moderate pain or headache.     amLODipine (NORVASC) 5 MG tablet Take 5 mg by mouth at bedtime.      aspirin 81 MG chewable tablet Chew 81 mg by mouth at bedtime.     diphenhydramine-acetaminophen (TYLENOL PM) 25-500 MG TABS Take 1 tablet by mouth at bedtime.      gabapentin (NEURONTIN) 300 MG capsule Take 300 mg by mouth 3 (three) times daily as needed (pain).     lisinopril-hydrochlorothiazide (ZESTORETIC) 20-12.5 MG tablet Take 2 tablets by mouth daily.     metFORMIN (GLUCOPHAGE-XR) 500 MG 24 hr tablet Take 500 mg by mouth 2 (two) times daily.      Multiple Vitamins-Minerals (CENTRUM SILVER PO) Take 1 capsule by mouth daily.     Polyethyl Glycol-Propyl Glycol (SYSTANE OP) Place 1 drop into both eyes daily as needed (dry eyes).     polyethylene glycol (MIRALAX / GLYCOLAX) packet Take 17 g by mouth at bedtime.      No current facility-administered medications for this visit.    Allergies as of  01/12/2022 - Review Complete 01/12/2022  Allergen Reaction Noted   Codeine Nausea And Vomiting 03/16/2011    Family History  Problem Relation Age of Onset   Cancer Mother    Breast cancer Mother    Colon cancer Mother    Cancer Sister    Breast cancer Sister    Cancer Sister    Lung cancer Father     Social History   Socioeconomic History   Marital status: Widowed    Spouse name: Not on file   Number of children: Not on file   Years of education: Not on file   Highest education level: Not on file  Occupational History   Not on file  Tobacco Use   Smoking status: Never   Smokeless tobacco: Never  Vaping Use   Vaping Use: Never used  Substance and Sexual Activity   Alcohol use: No   Drug use: No    Sexual activity: Not Currently    Birth control/protection: Surgical    Comment: hyst  Other Topics Concern   Not on file  Social History Narrative   Not on file   Social Determinants of Health   Financial Resource Strain: Low Risk  (10/18/2019)   Overall Financial Resource Strain (CARDIA)    Difficulty of Paying Living Expenses: Not hard at all  Food Insecurity: No Food Insecurity (10/18/2019)   Hunger Vital Sign    Worried About Running Out of Food in the Last Year: Never true    [place] in the Last Year: Never true  Transportation Needs: No Transportation Needs (10/18/2019)   PRAPARE - Hydrologist (Medical): No    Lack of Transportation (Non-Medical): No  Physical Activity: Insufficiently Active (10/18/2019)   Exercise Vital Sign    Days of Exercise per Week: 3 days    Minutes of Exercise per Session: 20 min  Stress: No Stress Concern Present (10/18/2019)   [place]    Feeling of Stress : Only a little  Social Connections: Moderately Isolated (10/18/2019)   Social Connection and Isolation Panel [NHANES]    Frequency of Communication with Friends and Family: Once a week    Frequency of Social Gatherings with Friends and Family: Once a week    Attends Religious Services: More than 4 times per year    Active Member of [employer] or Organizations: Yes    Attends Archivist Meetings: More than 4 times per year    Marital Status: Widowed  Intimate Partner Violence: Not At Risk (10/18/2019)   Humiliation, Afraid, Rape, and Kick questionnaire    Fear of Current or Ex-Partner: No    Emotionally Abused: No    Physically Abused: No    Sexually Abused: No     Review of Systems   Gen: Denies any fever, chills, fatigue, weight loss, lack of appetite.  CV: Denies chest pain, heart palpitations, peripheral edema, syncope.  Resp: Denies shortness of breath at rest or with exertion.  Denies wheezing or cough.  GI: see HPI GU : Denies urinary burning, urinary frequency, urinary hesitancy MS: Denies joint pain, muscle weakness, cramps, or limitation of movement.  Derm: Denies rash, itching, dry skin Psych: Denies depression, anxiety, memory loss, and confusion Heme: Denies bruising, bleeding, and enlarged lymph nodes.   Physical Exam   BP 127/66 (BP Location: Left Arm, Patient Position: Sitting, Cuff Size: Small)   Pulse 88   Temp 97.7 F (  36.5 C) (Oral)   Ht 5' 5.5" (1.664 m)   Wt 141 lb 12.8 oz (64.3 kg)   BMI 23.24 kg/m  General:   Alert and oriented. Pleasant and cooperative. Well-nourished and well-developed.  Head:  Normocephalic and atraumatic. Eyes:  Without icterus Ears:  Normal auditory acuity. Lungs:  Clear to auscultation bilaterally.  Heart:  S1, S2 present without murmurs appreciated.  Abdomen:  +BS, soft, non-tender and non-distended. No HSM noted. No guarding or rebound. No masses appreciated.  Rectal:  external hemorrhoid tags, no mass on DRE, dark burgundy stool on gloved finger, ?rectocele Msk:  Symmetrical without gross deformities. Normal posture. Extremities:  Without edema. Neurologic:  Alert and  oriented x4;  grossly normal neurologically. Skin:  Intact without significant lesions or rashes. Psych:  Alert and cooperative. Normal mood and affect.   Assessment   NYARA CAPELL is an 88 y.o. female presenting today with acute onset of rectal bleeding and notable drop in Hgb from May 2023 to present (12.8 in May and down to 8.3).  She has a known history of diverticulosis. Bleeding has tapered. Suspect diverticular bleed. Less likely ischemic etiology or malignancy. She will need diagnostic colonoscopy in near future. I discussed presenting to the ED If symptomatic or recurrent bleeding.    PLAN   Check CBC now  To ED if symptomatic or recurrent bleeding  Proceed with colonoscopy by Dr. Abbey Chatters  in near future: the risks,  benefits, and alternatives have been discussed with the patient in detail. The patient states understanding and desires to proceed.    Annitta Needs, PhD, ANP-BC [facility]

## 2022-01-12 NOTE — Patient Instructions (Signed)
Please have blood work done on Friday! We are making sure your blood counts are stable.  Please go to the emergency room if any further rectal bleeding.  We are arranging a colonoscopy in the near future. Please do not take metformin the day of the procedure.;   Further recommendations to follow!  It was a pleasure to see you today. I want to create trusting relationships with patients to provide genuine, compassionate, and quality care. I value your feedback. If you receive a survey regarding your visit,  I greatly appreciate you taking time to fill this out.   Annitta Needs, PhD, ANP-BC [facility]

## 2022-01-12 NOTE — Telephone Encounter (Signed)
Called pt. Scheduled for 8/11 3:00pm, arrival 1:30pm. Aware will leave prep sample and instructions.

## 2022-01-14 DIAGNOSIS — K625 Hemorrhage of anus and rectum: Secondary | ICD-10-CM | POA: Diagnosis not present

## 2022-01-15 ENCOUNTER — Encounter (HOSPITAL_COMMUNITY): Payer: Self-pay

## 2022-01-15 ENCOUNTER — Emergency Department (HOSPITAL_COMMUNITY): Payer: Medicare Other

## 2022-01-15 ENCOUNTER — Inpatient Hospital Stay (HOSPITAL_COMMUNITY)
Admission: EM | Admit: 2022-01-15 | Discharge: 2022-01-18 | DRG: 378 | Disposition: A | Discharge status: Home / Self Care | Payer: Medicare Other | Attending: Internal Medicine | Admitting: Internal Medicine

## 2022-01-15 DIAGNOSIS — D500 Iron deficiency anemia secondary to blood loss (chronic): Secondary | ICD-10-CM | POA: Diagnosis not present

## 2022-01-15 DIAGNOSIS — Z885 Allergy status to narcotic agent status: Secondary | ICD-10-CM | POA: Diagnosis not present

## 2022-01-15 DIAGNOSIS — K5909 Other constipation: Secondary | ICD-10-CM | POA: Diagnosis present

## 2022-01-15 DIAGNOSIS — Z7984 Long term (current) use of oral hypoglycemic drugs: Secondary | ICD-10-CM | POA: Diagnosis not present

## 2022-01-15 DIAGNOSIS — K429 Umbilical hernia without obstruction or gangrene: Secondary | ICD-10-CM | POA: Diagnosis not present

## 2022-01-15 DIAGNOSIS — K760 Fatty (change of) liver, not elsewhere classified: Secondary | ICD-10-CM | POA: Diagnosis present

## 2022-01-15 DIAGNOSIS — K921 Melena: Secondary | ICD-10-CM | POA: Diagnosis not present

## 2022-01-15 DIAGNOSIS — Z66 Do not resuscitate: Secondary | ICD-10-CM | POA: Diagnosis present

## 2022-01-15 DIAGNOSIS — E1122 Type 2 diabetes mellitus with diabetic chronic kidney disease: Secondary | ICD-10-CM | POA: Diagnosis not present

## 2022-01-15 DIAGNOSIS — I700 Atherosclerosis of aorta: Secondary | ICD-10-CM | POA: Diagnosis present

## 2022-01-15 DIAGNOSIS — E089 Diabetes mellitus due to underlying condition without complications: Secondary | ICD-10-CM | POA: Diagnosis not present

## 2022-01-15 DIAGNOSIS — Z79899 Other long term (current) drug therapy: Secondary | ICD-10-CM

## 2022-01-15 DIAGNOSIS — K922 Gastrointestinal hemorrhage, unspecified: Secondary | ICD-10-CM | POA: Diagnosis not present

## 2022-01-15 DIAGNOSIS — K7689 Other specified diseases of liver: Secondary | ICD-10-CM | POA: Diagnosis present

## 2022-01-15 DIAGNOSIS — N179 Acute kidney failure, unspecified: Secondary | ICD-10-CM | POA: Diagnosis present

## 2022-01-15 DIAGNOSIS — K5750 Diverticulosis of both small and large intestine without perforation or abscess without bleeding: Secondary | ICD-10-CM | POA: Diagnosis present

## 2022-01-15 DIAGNOSIS — Z9013 Acquired absence of bilateral breasts and nipples: Secondary | ICD-10-CM

## 2022-01-15 DIAGNOSIS — Z7982 Long term (current) use of aspirin: Secondary | ICD-10-CM

## 2022-01-15 DIAGNOSIS — E7800 Pure hypercholesterolemia, unspecified: Secondary | ICD-10-CM | POA: Diagnosis present

## 2022-01-15 DIAGNOSIS — I2510 Atherosclerotic heart disease of native coronary artery without angina pectoris: Secondary | ICD-10-CM | POA: Diagnosis present

## 2022-01-15 DIAGNOSIS — K449 Diaphragmatic hernia without obstruction or gangrene: Secondary | ICD-10-CM | POA: Diagnosis not present

## 2022-01-15 DIAGNOSIS — Z9842 Cataract extraction status, left eye: Secondary | ICD-10-CM

## 2022-01-15 DIAGNOSIS — K5730 Diverticulosis of large intestine without perforation or abscess without bleeding: Secondary | ICD-10-CM | POA: Diagnosis not present

## 2022-01-15 DIAGNOSIS — Z803 Family history of malignant neoplasm of breast: Secondary | ICD-10-CM | POA: Diagnosis not present

## 2022-01-15 DIAGNOSIS — Z9841 Cataract extraction status, right eye: Secondary | ICD-10-CM

## 2022-01-15 DIAGNOSIS — K5710 Diverticulosis of small intestine without perforation or abscess without bleeding: Secondary | ICD-10-CM | POA: Diagnosis not present

## 2022-01-15 DIAGNOSIS — D62 Acute posthemorrhagic anemia: Secondary | ICD-10-CM | POA: Diagnosis present

## 2022-01-15 DIAGNOSIS — I10 Essential (primary) hypertension: Secondary | ICD-10-CM | POA: Diagnosis not present

## 2022-01-15 DIAGNOSIS — E869 Volume depletion, unspecified: Secondary | ICD-10-CM | POA: Diagnosis present

## 2022-01-15 DIAGNOSIS — Z800 Family history of malignant neoplasm of digestive organs: Secondary | ICD-10-CM

## 2022-01-15 DIAGNOSIS — N189 Chronic kidney disease, unspecified: Secondary | ICD-10-CM | POA: Diagnosis not present

## 2022-01-15 DIAGNOSIS — Z853 Personal history of malignant neoplasm of breast: Secondary | ICD-10-CM | POA: Diagnosis not present

## 2022-01-15 LAB — COMPREHENSIVE METABOLIC PANEL
ALT: 25 U/L (ref 0–44)
AST: 22 U/L (ref 15–41)
Albumin: 3.8 g/dL (ref 3.5–5.0)
Alkaline Phosphatase: 58 U/L (ref 38–126)
Anion gap: 8 (ref 5–15)
BUN: 20 mg/dL (ref 8–23)
CO2: 26 mmol/L (ref 22–32)
Calcium: 9.5 mg/dL (ref 8.9–10.3)
Chloride: 99 mmol/L (ref 98–111)
Creatinine, Ser: 1.13 mg/dL — ABNORMAL HIGH (ref 0.44–1.00)
GFR, Estimated: 47 mL/min — ABNORMAL LOW (ref >60)
Glucose, Bld: 217 mg/dL — ABNORMAL HIGH (ref 70–99)
Potassium: 3.7 mmol/L (ref 3.5–5.1)
Sodium: 133 mmol/L — ABNORMAL LOW (ref 135–145)
Total Bilirubin: 0.2 mg/dL — ABNORMAL LOW (ref 0.3–1.2)
Total Protein: 6.5 g/dL (ref 6.5–8.1)

## 2022-01-15 LAB — CBC
HCT: 23 % — ABNORMAL LOW (ref 36.0–46.0)
Hemoglobin: 7.4 g/dL — ABNORMAL LOW (ref 12.0–15.0)
MCH: 28.9 pg (ref 26.0–34.0)
MCHC: 32.2 g/dL (ref 30.0–36.0)
MCV: 89.8 fL (ref 80.0–100.0)
Platelets: 474 10*3/uL — ABNORMAL HIGH (ref 150–400)
RBC: 2.56 MIL/uL — ABNORMAL LOW (ref 3.87–5.11)
RDW: 13.9 % (ref 11.5–15.5)
WBC: 7.6 10*3/uL (ref 4.0–10.5)
nRBC: 0.3 % — ABNORMAL HIGH (ref 0.0–0.2)

## 2022-01-15 LAB — CBC WITH DIFFERENTIAL/PLATELET
Basophils Absolute: 0.1 10*3/uL (ref 0.0–0.2)
Basos: 1 %
EOS (ABSOLUTE): 0.2 10*3/uL (ref 0.0–0.4)
Eos: 3 %
Hematocrit: 22.2 % — ABNORMAL LOW (ref 34.0–46.6)
Hemoglobin: 7.4 g/dL — ABNORMAL LOW (ref 11.1–15.9)
Immature Grans (Abs): 0 10*3/uL (ref 0.0–0.1)
Immature Granulocytes: 1 %
Lymphocytes Absolute: 1.8 10*3/uL (ref 0.7–3.1)
Lymphs: 26 %
MCH: 28.6 pg (ref 26.6–33.0)
MCHC: 33.3 g/dL (ref 31.5–35.7)
MCV: 86 fL (ref 79–97)
Monocytes Absolute: 0.4 10*3/uL (ref 0.1–0.9)
Monocytes: 6 %
Neutrophils Absolute: 4.4 10*3/uL (ref 1.4–7.0)
Neutrophils: 63 %
Platelets: 425 10*3/uL (ref 150–450)
RBC: 2.59 x10E6/uL — CL (ref 3.77–5.28)
RDW: 12.2 % (ref 11.7–15.4)
WBC: 6.8 10*3/uL (ref 3.4–10.8)

## 2022-01-15 MED ORDER — PANTOPRAZOLE SODIUM 40 MG IV SOLR
40.0000 mg | Freq: Once | INTRAVENOUS | Status: AC
Start: 2022-01-15 — End: 2022-01-15
  Administered 2022-01-15: 40 mg via INTRAVENOUS
  Filled 2022-01-15: qty 10

## 2022-01-15 MED ORDER — IOHEXOL 350 MG/ML SOLN
100.0000 mL | Freq: Once | INTRAVENOUS | Status: AC | PRN
  Administered 2022-01-15: 100 mL via INTRAVENOUS

## 2022-01-15 NOTE — ED Triage Notes (Signed)
Pt states that she has been gettnig progressively weaker and havign near syncope events. Pt was seen by GI, as she has been having dark red blood in her stool . Pt states that she has a colonoscopy  scheduled on Friday.

## 2022-01-15 NOTE — ED Provider Notes (Signed)
[facility] EMERGENCY DEPARTMENT Provider Note  CSN: [id number] Arrival date & time: 01/15/22 1831  Chief Complaint(s) Rectal Bleeding  HPI Christine Sutton is a 88 y.o. female with PMH T2DM, breast cancer status postmastectomy who presents emergency department for evaluation of rectal bleeding and dark stools.  Patient states that over the last week she has noticed bright red blood per rectum that is since transition to black stools.  She was seen by the GI nurse practitioner earlier in the week who scheduled her for colonoscopy next week but was instructed that if she has any worsening symptoms she should come to the emergency department.  Patient states that she is having significant dizziness with exertion and shortness of breath.  Thus she came to the ER for further evaluation.  Patient also endorses some mild left lower quadrant abdominal pain.   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[place]) 01/27/2014   ER +   Patient Active Problem List   Diagnosis Date Noted   History of rectal bleeding 05/04/2021   Other spondylosis with radiculopathy, cervical region 01/14/2021   Foraminal stenosis of cervical region 01/14/2021   Family history of breast cancer    Family history of colon cancer    Family history of lung cancer    Genetic testing 06/28/2020   Breast cancer ([place]) 05/15/2020   Malignant neoplasm of upper-outer quadrant of right breast in female, estrogen receptor negative ([place]) 04/28/2020   History of breast cancer 10/18/2019   Pelvic pressure in female 10/18/2019   Left lower quadrant abdominal tenderness without rebound tenderness 10/18/2019   Vulvar itching 10/18/2019   Screening for  colorectal cancer 10/18/2019   Invasive ductal carcinoma of left breast ([place]) 01/27/2014   Diabetes mellitus due to underlying condition without complications ([place]) 23/53/6144   Hypertension, essential, benign 12/24/2013   Home Medication(s) Prior to Admission medications   Medication Sig Start Date End Date Taking? Authorizing Provider  acetaminophen (TYLENOL) 500 MG tablet Take 500 mg by mouth every 6 (six) hours as needed for moderate pain or headache.    [provider]  amLODipine (NORVASC) 5 MG tablet Take 5 mg by mouth at bedtime.     [provider]  aspirin 81 MG chewable tablet Chew 81 mg by mouth at bedtime.    [provider]  calcium carbonate (OSCAL) 1500 (600 Ca) MG TABS tablet Take 600 mg of elemental calcium by mouth daily.    [provider]  diphenhydramine-acetaminophen (TYLENOL PM) 25-500 MG TABS Take 1 tablet by mouth at bedtime.     [provider]  gabapentin (NEURONTIN) 300 MG capsule Take 300 mg by mouth 3 (three) times daily as needed (pain). 09/30/19   [provider]  lisinopril-hydrochlorothiazide (ZESTORETIC) 20-12.5 MG tablet Take 2 tablets by mouth daily.    [provider]  metFORMIN (GLUCOPHAGE-XR) 500 MG 24 hr tablet Take 500 mg by mouth 2 (two) times daily.  12/12/16   [provider]  Multiple Vitamins-Minerals (CENTRUM SILVER PO) Take 1 tablet by mouth daily.    [provider]  nitrofurantoin (MACRODANTIN) 100 MG capsule  Take 100 mg by mouth daily. 11/23/21   [provider]  Polyethyl Glycol-Propyl Glycol (SYSTANE OP) Place 1 drop into both eyes daily as needed (dry eyes).    [provider]  polyethylene glycol (MIRALAX / GLYCOLAX) packet Take 17 g by mouth at bedtime.     [provider]                                                                                                                                    Past Surgical History Past  Surgical History:  Procedure Laterality Date   BACK SURGERY     lower   CATARACT EXTRACTION, BILATERAL  11/2014   MASTECTOMY Left    SENTINEL NODE BIOPSY Right 05/15/2020   Procedure: SENTINEL NODE BIOPSY;  Surgeon: Virl Cagey, MD;  Location: [facility];  Service: General;  Laterality: Right;   TOTAL MASTECTOMY Right 05/15/2020   Procedure: TOTAL MASTECTOMY RIGHT BREAST;  Surgeon: Virl Cagey, MD;  Location: [facility];  Service: General;  Laterality: Right;   Family History Family History  Problem Relation Age of Onset   Cancer Mother    Breast cancer Mother    Colon cancer Mother    Cancer Sister    Breast cancer Sister    Cancer Sister    Lung cancer Father     Social History Social History   Tobacco Use   Smoking status: Never   Smokeless tobacco: Never  Vaping Use   Vaping Use: Never used  Substance Use Topics   Alcohol use: No   Drug use: No   Allergies Codeine  Review of Systems Review of Systems  Constitutional:  Positive for fatigue.  Respiratory:  Positive for shortness of breath.   Gastrointestinal:  Positive for abdominal pain and blood in stool.    Physical Exam Vital Signs  I have reviewed the triage vital signs BP (!) 162/67 (BP Location: Right Arm)   Pulse 99   Temp 98.6 F (37 C) (Oral)   Resp 16   Ht 5' 5.5" (1.664 m)   Wt 64 kg   SpO2 98%   BMI 23.11 kg/m   Physical Exam Vitals and nursing note reviewed.  Constitutional:      General: She is not in acute distress.    Appearance: She is well-developed.  HENT:     Head: Normocephalic and atraumatic.  Eyes:     Conjunctiva/sclera: Conjunctivae normal.  Cardiovascular:     Rate and Rhythm: Normal rate and regular rhythm.     Heart sounds: No murmur heard. Pulmonary:     Effort: Pulmonary effort is normal. No respiratory distress.     Breath sounds: Normal breath sounds.  Abdominal:     Palpations: Abdomen is soft.     Tenderness: There is abdominal tenderness.   Musculoskeletal:        General: No swelling.     Cervical back:  Neck supple.  Skin:    General: Skin is warm and dry.     Capillary Refill: Capillary refill takes less than 2 seconds.  Neurological:     Mental Status: She is alert.  Psychiatric:        Mood and Affect: Mood normal.     ED Results and Treatments Labs (all labs ordered are listed, but only abnormal results are displayed) Labs Reviewed  COMPREHENSIVE METABOLIC PANEL  CBC  POC OCCULT BLOOD, ED  TYPE AND SCREEN                                                                                                                          Radiology No results found.  Pertinent labs & imaging results that were available during my care of the patient were reviewed by me and considered in my medical decision making (see MDM for details).  Medications Ordered in ED Medications - No data to display                                                                                                                                   Procedures Procedures  (including critical care time)  Medical Decision Making / ED Course   This patient presents to the ED for concern of rectal bleeding and dark stools, this involves an extensive number of treatment options, and is a complaint that carries with it a high risk of complications and morbidity.  The differential diagnosis includes upper GI bleed, lower GI bleed, hemorrhoids, AVM, symptomatic anemia  MDM: Patient seen emergency room for evaluation of rectal bleeding and dark stools.  Physical exam with mild left lower quadrant tenderness palpation but is otherwise unremarkable.  Rectal exam deferred given patient is already known to be at risk for GI bleeding and this will not change her management.  There is no gross blood on external exam.  Laboratory evaluation with a creatinine of 1.13 which is near baseline and hemoglobin of 7.4.  5 days ago the patient's hemoglobin was 8.3,  yesterday was 7.4 and is stable today at 7.4 but given the patient appears to be quite symptomatic from this I consulted the GI physician on-call Dr. Gala Romney who agreed to evaluate the patient in the morning and evaluate her for expedited colonoscopy.  CT angio unremarkable.  Patient then admitted to medicine.  PPI initiated.   Additional history obtained: -Additional  history obtained from husband -External records from outside source obtained and reviewed including: Chart review including previous notes, labs, imaging, consultation notes   Lab Tests: -I ordered, reviewed, and interpreted labs.   The pertinent results include:   Labs Reviewed  COMPREHENSIVE METABOLIC PANEL  CBC  POC OCCULT BLOOD, ED  TYPE AND SCREEN        Imaging Studies ordered: I ordered imaging studies including CT angio abdomen pelvis I independently visualized and interpreted imaging. I agree with the radiologist interpretation   Medicines ordered and prescription drug management: No orders of the defined types were placed in this encounter.   -I have reviewed the patients home medicines and have made adjustments as needed  Critical interventions none  Consultations Obtained: I requested consultation with the gastroenterologist,  and discussed lab and imaging findings as well as pertinent plan - they recommend: Hospital admission, PPI   Cardiac Monitoring: The patient was maintained on a cardiac monitor.  I personally viewed and interpreted the cardiac monitored which showed an underlying rhythm of: NSR  Social Determinants of Health:  Factors impacting patients care include: none   Reevaluation: After the interventions noted above, I reevaluated the patient and found that they have :stayed the same  Co morbidities that complicate the patient evaluation  Past Medical History:  Diagnosis Date   Breast disorder    Diabetes mellitus ([place])    Family history of breast cancer    Family history of  colon cancer    Family history of lung cancer    High blood cholesterol level    HX: breast cancer    left   Hypertension    Invasive ductal carcinoma of left breast ([place]) 01/27/2014   ER +      Dispostion: I considered admission for this patient, and due to symptomatic anemia secondary to likely GI bleed patient will require hospital admission.     Final Clinical Impression(s) / ED Diagnoses Final diagnoses:  None     '@PCDICTATION'$ @    Teressa Lower, MD 01/16/22 1528

## 2022-01-16 ENCOUNTER — Encounter (HOSPITAL_COMMUNITY): Payer: Self-pay | Admitting: Family Medicine

## 2022-01-16 ENCOUNTER — Other Ambulatory Visit: Payer: Self-pay

## 2022-01-16 DIAGNOSIS — D500 Iron deficiency anemia secondary to blood loss (chronic): Secondary | ICD-10-CM | POA: Diagnosis not present

## 2022-01-16 DIAGNOSIS — I10 Essential (primary) hypertension: Secondary | ICD-10-CM

## 2022-01-16 DIAGNOSIS — N179 Acute kidney failure, unspecified: Secondary | ICD-10-CM | POA: Diagnosis not present

## 2022-01-16 DIAGNOSIS — K922 Gastrointestinal hemorrhage, unspecified: Secondary | ICD-10-CM | POA: Diagnosis not present

## 2022-01-16 DIAGNOSIS — E089 Diabetes mellitus due to underlying condition without complications: Secondary | ICD-10-CM

## 2022-01-16 LAB — COMPREHENSIVE METABOLIC PANEL
ALT: 25 U/L (ref 0–44)
AST: 22 U/L (ref 15–41)
Albumin: 3.8 g/dL (ref 3.5–5.0)
Alkaline Phosphatase: 56 U/L (ref 38–126)
Anion gap: 7 (ref 5–15)
BUN: 17 mg/dL (ref 8–23)
CO2: 25 mmol/L (ref 22–32)
Calcium: 9.4 mg/dL (ref 8.9–10.3)
Chloride: 103 mmol/L (ref 98–111)
Creatinine, Ser: 0.75 mg/dL (ref 0.44–1.00)
GFR, Estimated: >60 mL/min (ref >60)
Glucose, Bld: 156 mg/dL — ABNORMAL HIGH (ref 70–99)
Potassium: 3.9 mmol/L (ref 3.5–5.1)
Sodium: 135 mmol/L (ref 135–145)
Total Bilirubin: 0.4 mg/dL (ref 0.3–1.2)
Total Protein: 6.7 g/dL (ref 6.5–8.1)

## 2022-01-16 LAB — PROTIME-INR
INR: 1 (ref 0.8–1.2)
Prothrombin Time: 12.7 seconds (ref 11.4–15.2)

## 2022-01-16 LAB — CBC
HCT: 23.9 % — ABNORMAL LOW (ref 36.0–46.0)
HCT: 22.3 % — ABNORMAL LOW (ref 36.0–46.0)
HCT: 22.1 % — ABNORMAL LOW (ref 36.0–46.0)
Hemoglobin: 7.6 g/dL — ABNORMAL LOW (ref 12.0–15.0)
Hemoglobin: 7.2 g/dL — ABNORMAL LOW (ref 12.0–15.0)
Hemoglobin: 7 g/dL — ABNORMAL LOW (ref 12.0–15.0)
MCH: 28.6 pg (ref 26.0–34.0)
MCH: 28.7 pg (ref 26.0–34.0)
MCH: 28.2 pg (ref 26.0–34.0)
MCHC: 31.8 g/dL (ref 30.0–36.0)
MCHC: 32.3 g/dL (ref 30.0–36.0)
MCHC: 31.7 g/dL (ref 30.0–36.0)
MCV: 89.8 fL (ref 80.0–100.0)
MCV: 88.8 fL (ref 80.0–100.0)
MCV: 89.1 fL (ref 80.0–100.0)
Platelets: 465 10*3/uL — ABNORMAL HIGH (ref 150–400)
Platelets: 429 10*3/uL — ABNORMAL HIGH (ref 150–400)
Platelets: 419 10*3/uL — ABNORMAL HIGH (ref 150–400)
RBC: 2.66 MIL/uL — ABNORMAL LOW (ref 3.87–5.11)
RBC: 2.51 MIL/uL — ABNORMAL LOW (ref 3.87–5.11)
RBC: 2.48 MIL/uL — ABNORMAL LOW (ref 3.87–5.11)
RDW: 13.8 % (ref 11.5–15.5)
RDW: 14 % (ref 11.5–15.5)
RDW: 14 % (ref 11.5–15.5)
WBC: 7.5 10*3/uL (ref 4.0–10.5)
WBC: 8.5 10*3/uL (ref 4.0–10.5)
WBC: 9.9 10*3/uL (ref 4.0–10.5)
nRBC: 0 % (ref 0.0–0.2)
nRBC: 0.2 % (ref 0.0–0.2)
nRBC: 0 % (ref 0.0–0.2)

## 2022-01-16 LAB — GLUCOSE, CAPILLARY
Glucose-Capillary: 123 mg/dL — ABNORMAL HIGH (ref 70–99)
Glucose-Capillary: 135 mg/dL — ABNORMAL HIGH (ref 70–99)
Glucose-Capillary: 135 mg/dL — ABNORMAL HIGH (ref 70–99)
Glucose-Capillary: 120 mg/dL — ABNORMAL HIGH (ref 70–99)

## 2022-01-16 LAB — HEMOGLOBIN A1C
Hgb A1c MFr Bld: 6 % — ABNORMAL HIGH (ref 4.8–5.6)
Mean Plasma Glucose: 125.5 mg/dL

## 2022-01-16 LAB — MAGNESIUM: Magnesium: 1.8 mg/dL (ref 1.7–2.4)

## 2022-01-16 LAB — PREPARE RBC (CROSSMATCH)

## 2022-01-16 MED ORDER — DIPHENHYDRAMINE-APAP (SLEEP) 25-500 MG PO TABS: 1.0000 | ORAL_TABLET | Freq: Every day | ORAL | Status: DC

## 2022-01-16 MED ORDER — MORPHINE SULFATE (PF) 2 MG/ML IV SOLN: 2.0000 mg | INTRAVENOUS | Status: DC | PRN

## 2022-01-16 MED ORDER — PANTOPRAZOLE SODIUM 40 MG IV SOLR
40.0000 mg | Freq: Two times a day (BID) | INTRAVENOUS | Status: DC
  Administered 2022-01-16 – 2022-01-18 (×6): 40 mg via INTRAVENOUS
  Filled 2022-01-16 (×6): qty 10

## 2022-01-16 MED ORDER — ONDANSETRON HCL 4 MG/2ML IJ SOLN: 4.0000 mg | Freq: Four times a day (QID) | INTRAMUSCULAR | Status: DC | PRN

## 2022-01-16 MED ORDER — INSULIN ASPART 100 UNIT/ML IJ SOLN
0.0000 [IU] | Freq: Three times a day (TID) | INTRAMUSCULAR | Status: DC
  Administered 2022-01-16 (×3): 2 [IU] via SUBCUTANEOUS
  Administered 2022-01-17: 3 [IU] via SUBCUTANEOUS

## 2022-01-16 MED ORDER — DIPHENHYDRAMINE HCL 25 MG PO CAPS
25.0000 mg | ORAL_CAPSULE | Freq: Every day | ORAL | Status: DC
  Administered 2022-01-16 – 2022-01-17 (×3): 25 mg via ORAL
  Filled 2022-01-16 (×3): qty 1

## 2022-01-16 MED ORDER — SODIUM CHLORIDE 0.9% IV SOLUTION
Freq: Once | INTRAVENOUS | Status: AC
Start: 2022-01-16 — End: 2022-01-16

## 2022-01-16 MED ORDER — ACETAMINOPHEN 650 MG RE SUPP: 650.0000 mg | Freq: Four times a day (QID) | RECTAL | Status: DC | PRN

## 2022-01-16 MED ORDER — GABAPENTIN 300 MG PO CAPS: 300.0000 mg | ORAL_CAPSULE | Freq: Three times a day (TID) | ORAL | Status: DC | PRN

## 2022-01-16 MED ORDER — ONDANSETRON HCL 4 MG PO TABS: 4.0000 mg | ORAL_TABLET | Freq: Four times a day (QID) | ORAL | Status: DC | PRN

## 2022-01-16 MED ORDER — ORAL CARE MOUTH RINSE
15.0000 mL | OROMUCOSAL | Status: DC | PRN
Start: 2022-01-16 — End: 2022-01-18

## 2022-01-16 MED ORDER — OXYCODONE HCL 5 MG PO TABS: 5.0000 mg | ORAL_TABLET | ORAL | Status: DC | PRN

## 2022-01-16 MED ORDER — ACETAMINOPHEN 500 MG PO TABS
500.0000 mg | ORAL_TABLET | Freq: Every day | ORAL | Status: DC
  Administered 2022-01-16 – 2022-01-17 (×3): 500 mg via ORAL
  Filled 2022-01-16 (×3): qty 1

## 2022-01-16 MED ORDER — SODIUM CHLORIDE 0.9 % IV SOLN: INTRAVENOUS | Status: DC

## 2022-01-16 MED ORDER — ACETAMINOPHEN 325 MG PO TABS: 650.0000 mg | ORAL_TABLET | Freq: Four times a day (QID) | ORAL | Status: DC | PRN

## 2022-01-16 NOTE — Assessment & Plan Note (Signed)
-   Secondary to anemia - Creatinine increased from 0.78>> 1.13 - Continue IV hydration throughout the night - Trend in the a.m. - Hold nephrotoxic agents when possible

## 2022-01-16 NOTE — Assessment & Plan Note (Signed)
-   Hold metformin -Sliding scale coverage -Clear liquid diet -Hemoglobin A1c in the a.m. -Continue to monitor

## 2022-01-16 NOTE — Assessment & Plan Note (Addendum)
-   Bright red blood per rectum once a day for 6 days.  On the seventh day melena -Hemoglobin dropped to 7.4 -GI consulted and recommends clear liquid diet with plan for them to see her in the a.m. -Protonix started -Patient typed and screened -Trend CBCs every 8 hours -Continue to monitor

## 2022-01-16 NOTE — Assessment & Plan Note (Signed)
-   Acute blood loss anemia with hemoglobin drop from 8.3>> 7.4 in 4 days, and previous to that hemoglobin was 12.8 in May 2023 -GI bleed -GI to see patient in the a.m.

## 2022-01-16 NOTE — Progress Notes (Signed)
  Transition of Care [facility]) Screening Note   Patient Details  Name: Christine Sutton Date of Birth: 06-06-1930   Transition of Care [facility]) CM/SW Contact:    Iona Beard, [place] Phone Number: 01/16/2022, 11:43 AM    Transition of Care Department [facility]) has reviewed patient and no TOC needs have been identified at this time. We will continue to monitor patient advancement through interdisciplinary progression rounds. If new patient transition needs arise, please place a TOC consult.

## 2022-01-16 NOTE — Progress Notes (Signed)
Patient admitted to the hospital earlier this morning by Dr. Clearence Ped  Patient seen and examined  She feels that she is doing better.  She has had a bowel movement that did contain no blood.  Still feels tired.  Hemoglobin is down to 7.0 and has seemed to slowly trickle down, likely has a component of hemodilution with IV fluids.  Says she is somewhat symptomatic, appears to be pale, and hemoglobin has trended down, will transfuse 1 unit of PRBC.  GI is following and plans on EGD/colonoscopy in a.m.  [employer]

## 2022-01-16 NOTE — Consult Note (Signed)
Referring Provider: No ref. provider found Primary Care Physician:  Asencion Noble, MD Primary Gastroenterologist:  Dr.Danique Hartsough  Reason for Consultation: GI bleed  HPI: [name] is a very pleasant 88 year old lady admitted through the ED yesterday with a 1 week history of low-volume, essentially paper hematochezia in the setting of chronic constipation -  historically managed with MiraLAX.  She became progressively weaker over the past week and presented to the ED.  She states over the 24 to 48 hours prior to admission her stools actually turned black.  In the ED, very dark stool on rectal examination.  She has been hemodynamically stable. Hemoglobin 3 months ago 12.8, 5 days ago 8.3   this morning 7.2.  She has not been transfused. No BM now in over 24 hours. This lady also notes some vague left lower quadrant abdominal pain which she relates to constipation.  Chronically constipated  - takes MiraLAX daily.  She tells me it seems like MiraLAX has not been working as good lately.  In the ED, she had a CT scan with contrast which revealed quite a bit of stool burden and advanced diverticulosis but no inflammatory changes.  Noncritical atherosclerotic disease.  Benign-appearing hepatic cyst and small pancreatic cyst (which are noted to be smaller than that seen previously). Colonoscopy 2006 Dr. Erlene Quan. This nice lady denies any chronic symptoms of dysphagia, odynophagia, GERD, early satiety, nausea or vomiting.  No NSAIDs or alcohol.  No anticoagulation. Interestingly, she was seen in our office on 8/2; slated for colonoscopy later this week by Dr. Abbey Chatters. Mother had colon cancer at advanced age.  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 Authorizing Provider  amLODipine (NORVASC) 5 MG tablet Take 5 mg by mouth at bedtime.    Yes [provider]  aspirin 81 MG chewable tablet Chew 81 mg by mouth at bedtime.   Yes [provider]  calcium carbonate (OSCAL) 1500 (600 Ca) MG TABS tablet Take 600 mg of elemental calcium by mouth daily.   Yes [provider]  gabapentin (NEURONTIN) 300 MG capsule Take 300 mg by mouth 3 (three) times daily as needed (pain). 09/30/19  Yes [provider]  lisinopril-hydrochlorothiazide (ZESTORETIC) 20-12.5 MG tablet Take 2 tablets by mouth daily.   Yes [provider]  acetaminophen (TYLENOL) 500 MG tablet Take 500 mg by mouth every 6 (six) hours as needed for moderate pain or headache.    [provider]  diphenhydramine-acetaminophen (TYLENOL PM) 25-500  MG TABS Take 1 tablet by mouth at bedtime.     [provider]  metFORMIN (GLUCOPHAGE-XR) 500 MG 24 hr tablet Take 500 mg by mouth 2 (two) times daily.  12/12/16   [provider]  Multiple Vitamins-Minerals (CENTRUM SILVER PO) Take 1 tablet by mouth daily.    [provider]  nitrofurantoin (MACRODANTIN) 100 MG capsule Take 100 mg by mouth daily. 11/23/21   [provider]  Polyethyl Glycol-Propyl Glycol (SYSTANE OP) Place 1 drop into both eyes daily as needed  (dry eyes).    [provider]  polyethylene glycol (MIRALAX / GLYCOLAX) packet Take 17 g by mouth at bedtime.     [provider]    Current Facility-Administered Medications  Medication Dose Route Frequency Provider Last Rate Last Admin   0.9 %  sodium chloride infusion   Intravenous Continuous Zierle-Ghosh, Asia B, DO 75 mL/hr at 01/16/22 0540 Infusion Verify at 01/16/22 0540   acetaminophen (TYLENOL) tablet 650 mg  650 mg Oral Q6H PRN Zierle-Ghosh, Asia B, DO       Or   acetaminophen (TYLENOL) suppository 650 mg  650 mg Rectal Q6H PRN Zierle-Ghosh, Asia B, DO       acetaminophen (TYLENOL) tablet 500 mg  500 mg Oral QHS Zierle-Ghosh, Asia B, DO   500 mg at 01/16/22 4098   And   diphenhydrAMINE (BENADRYL) capsule 25 mg  25 mg Oral QHS Zierle-Ghosh, Asia B, DO   25 mg at 01/16/22 0326   gabapentin (NEURONTIN) capsule 300 mg  300 mg Oral TID PRN Zierle-Ghosh, Asia B, DO       insulin aspart (novoLOG) injection 0-15 Units  0-15 Units Subcutaneous TID WC Zierle-Ghosh, Asia B, DO   2 Units at 01/16/22 0810   morphine (PF) 2 MG/ML injection 2 mg  2 mg Intravenous Q2H PRN Zierle-Ghosh, Asia B, DO       ondansetron (ZOFRAN) tablet 4 mg  4 mg Oral Q6H PRN Zierle-Ghosh, Asia B, DO       Or   ondansetron (ZOFRAN) injection 4 mg  4 mg Intravenous Q6H PRN Zierle-Ghosh, Asia B, DO       Oral care mouth rinse  15 mL Mouth Rinse PRN Zierle-Ghosh, Asia B, DO       oxyCODONE (Oxy IR/ROXICODONE) immediate release tablet 5 mg  5 mg Oral Q4H PRN Zierle-Ghosh, Asia B, DO       pantoprazole (PROTONIX) injection 40 mg  40 mg Intravenous Q12H Zierle-Ghosh, Asia B, DO   40 mg at 01/16/22 0810    Allergies as of 01/15/2022 - Review Complete 01/15/2022  Allergen Reaction Noted   Codeine Nausea And Vomiting 03/16/2011    Family History  Problem Relation Age of Onset   Cancer Mother    Breast cancer Mother    Colon cancer Mother    Cancer Sister    Breast cancer Sister    Cancer Sister     Lung cancer Father     Social History   Socioeconomic History   Marital status: Widowed    Spouse name: Not on file   Number of children: Not on file   Years of education: Not on file   Highest education level: Not on file  Occupational History   Not on file  Tobacco Use   Smoking status: Never   Smokeless tobacco: Never  Vaping Use   Vaping Use: Never used  Substance and Sexual Activity   Alcohol use: No   Drug  use: No   Sexual activity: Not Currently    Birth control/protection: Surgical    Comment: hyst  Other Topics Concern   Not on file  Social History Narrative   Not on file   Social Determinants of Health   Financial Resource Strain: Low Risk  (10/18/2019)   Overall Financial Resource Strain (CARDIA)    Difficulty of Paying Living Expenses: Not hard at all  Food Insecurity: No Food Insecurity (10/18/2019)   Hunger Vital Sign    Worried About Running Out of Food in the Last Year: Never true    Ran Out of Food in the Last Year: Never true  Transportation Needs: No Transportation Needs (10/18/2019)   PRAPARE - Hydrologist (Medical): No    Lack of Transportation (Non-Medical): No  Physical Activity: Insufficiently Active (10/18/2019)   Exercise Vital Sign    Days of Exercise per Week: 3 days    Minutes of Exercise per Session: 20 min  Stress: No Stress Concern Present (10/18/2019)   [place]    Feeling of Stress : Only a little  Social Connections: Moderately Isolated (10/18/2019)   Social Connection and Isolation Panel [NHANES]    Frequency of Communication with Friends and Family: Once a week    Frequency of Social Gatherings with Friends and Family: Once a week    Attends Religious Services: More than 4 times per year    Active Member of [employer] or Organizations: Yes    Attends Archivist Meetings: More than 4 times per year    Marital Status: Widowed  Intimate  Partner Violence: Not At Risk (10/18/2019)   Humiliation, Afraid, Rape, and Kick questionnaire    Fear of Current or Ex-Partner: No    Emotionally Abused: No    Physically Abused: No    Sexually Abused: No    Review of Systems: As in history of his illness   physical Exam: Vital signs in last 24 hours: Temp:  [97.7 F (36.5 C)-98.6 F (37 C)] 98.1 F (36.7 C) (08/06 0412) Pulse Rate:  [87-99] 88 (08/06 0412) Resp:  [16-18] 16 (08/06 0412) BP: (122-162)/(60-71) 149/60 (08/06 0412) SpO2:  [94 %-99 %] 94 % (08/06 0412) Weight:  [62.8 kg-64 kg] 62.8 kg (08/06 0412) Last BM Date : 01/15/22 General:   Alert,   pleasant and cooperative in NAD Head:  Normocephalic and atraumatic. Lungs:  Clear throughout to auscultation.   No wheezes, crackles, or rhonchi. No acute distress. Heart:  Regular rate and rhythm; no murmurs, clicks, rubs,  or gallops. Abdomen: Positive bowel sounds soft and with some left lower quadrant just to palpation.  No appreciable mass organomegaly. Intake/Output from previous day: 08/05 0701 - 08/06 0700 In: 162 [I.V.:162] Out: -  Intake/Output this shift: Total I/O In: 716 [P.O.:716] Out: -   Lab Results: Recent Labs    01/15/22 1911 01/16/22 0325 01/16/22 0814  WBC 7.6 7.5 8.5  HGB 7.4* 7.6* 7.2*  HCT 23.0* 23.9* 22.3*  PLT 474* 465* 429*   BMET Recent Labs    01/15/22 1911 01/16/22 0327  NA 133* 135  K 3.7 3.9  CL 99 103  CO2 26 25  GLUCOSE 217* 156*  BUN 20 17  CREATININE 1.13* 0.75  CALCIUM 9.5 9.4   LFT Recent Labs    01/16/22 0327  PROT 6.7  ALBUMIN 3.8  AST 22  ALT 25  ALKPHOS 56  BILITOT 0.4  PT/INR Recent Labs    01/16/22 0327  LABPROT 12.7  INR 1.0   Hepatitis Panel No results for input(s): "HEPBSAG", "HCVAB", "HEPAIGM", "HEPBIGM" in the last 72 hours. C-Diff No results for input(s): "CDIFFTOX" in the last 72 hours.  Studies/Results: CT Angio Abd/Pel W and/or Wo Contrast  Result Date: 01/15/2022 CLINICAL DATA:   GI bleed, lower.  Bright red blood stools, weakness. EXAM: CTA ABDOMEN AND PELVIS WITHOUT AND WITH CONTRAST TECHNIQUE: Multidetector CT imaging of the abdomen and pelvis was performed using the standard protocol during bolus administration of intravenous contrast. Multiplanar reconstructed images and MIPs were obtained and reviewed to evaluate the vascular anatomy. RADIATION DOSE REDUCTION: This exam was performed according to the departmental dose-optimization program which includes automated exposure control, adjustment of the mA and/or kV according to patient size and/or use of iterative reconstruction technique. CONTRAST:  120m OMNIPAQUE IOHEXOL 350 MG/ML SOLN COMPARISON:  12/04/2020, 01/11/2022. FINDINGS: VASCULAR Aorta: Aortic atherosclerosis. Normal caliber aorta without aneurysm, dissection, vasculitis or significant stenosis. Celiac: Mild atherosclerotic calcification. Patent without evidence of aneurysm, dissection, vasculitis or significant stenosis. SMA: Mild atherosclerotic calcification. Patent without evidence of aneurysm, dissection, vasculitis or significant stenosis. Renals: Mild atherosclerotic calcification. Both renal arteries are patent without evidence of aneurysm, dissection, vasculitis, fibromuscular dysplasia or significant stenosis. IMA: Patent. Inflow: Mild atherosclerotic calcification. Patent without evidence of aneurysm, dissection, vasculitis or significant stenosis. Proximal Outflow: Mild atherosclerotic calcification. Bilateral common femoral and visualized portions of the superficial and profunda femoral arteries are patent without evidence of aneurysm, dissection, vasculitis or significant stenosis. Veins: No obvious venous abnormality within the limitations of this arterial phase study. Review of the MIP images confirms the above findings. NON-VASCULAR Lower chest: A few scattered coronary artery calcifications are noted. Atelectasis or scarring is present at the lung bases.  Hepatobiliary: Scattered hypodensities are present in the liver, the largest are cystic in attenuation. Hepatic steatosis is noted. The gallbladder is without stones. No biliary ductal dilatation. Pancreas: Cysts are present in the pancreatic body measuring 1.1 and 1.6 cm, slightly decreased from the prior exam. There is a cyst in the pancreatic tail measuring 9 mm, also decreased from the prior exam. No pancreatic ductal dilatation or surrounding inflammatory changes Spleen: Normal in size without focal abnormality. Adrenals/Urinary Tract: No adrenal nodule or mass. The kidneys enhance symmetrically. Hypodensities are noted in the kidneys bilaterally, previously characterized on cysts on recent MRI. No renal calculus or hydronephrosis. The bladder is unremarkable. Stomach/Bowel: There is a small hiatal hernia. No bowel obstruction, free air, or pneumatosis. Extensive colonic diverticulosis without diverticulitis. There is a moderate amount of retained stool in the colon. The appendix is not visualized on exam. No acute hemorrhage or active contrast extravasation is seen. Lymphatic: No abdominal or pelvic lymphadenopathy by size criteria. Reproductive: Status post hysterectomy. No adnexal masses. Other: No abdominopelvic ascites. Fat containing periumbilical hernia. Musculoskeletal: Dextroscoliosis and degenerative changes in the thoracolumbar spine. Lumbar spinal fusion hardware is noted at L4-L5. IMPRESSION: VASCULAR 1. No evidence contrast extravasation or active hemorrhage. 2. Aortic atherosclerosis without evidence of aneurysm or dissection. NON-VASCULAR 1. Extensive colonic diverticulosis without diverticulitis. 2. Moderate amount of retained stool in the colon suggesting constipation. 3. Hepatic cysts and steatosis. 4. Renal cysts. 5. Pancreatic cysts, slightly decreased in size from the prior exam. Electronically Signed   By: LBrett FairyM.D.   On: 01/15/2022 21:57     Impression: 88year old lady with  intermittent red blood per rectum over the past week associated with left lower  quadrant abdominal pain and perceived insidiously worsening constipation.  Progressive weakness;  presented to the ED yesterday; found to be significantly anemic with blood in her stool.  CT findings revealed advanced diverticulosis and increased stool burden on the left side.  No evidence of colitis, mass or critical mesenteric stenosis. Hepatic and pancreatic cyst felt not to be clinically significant.  No stool since admission and she remains hemodynamically stable.  I suspect her GI bleeding is emanating from her colon.  Differential is somewhat broad.  She could have some low-grade ischemia from chronic constipation.  An occult mass certainly remains in the differential as does diverticular etiology.  Most recent stools nearly black which is of uncertain significance but needs to be kept in mind as her GI evaluation proceeds (no history of iron or Pepto-Bismol).  BUN remains normal.   Recommendations:  I have offered the patient both a diagnostic colonoscopy and possible EGD to follow depending on findings of colonoscopy tomorrow.The risks, benefits, limitations, imponderables and alternatives regarding both EGD and colonoscopy have been reviewed with the patient. Questions have been answered.   Trend H&H; empiric PPI therapy for the time being.  The patient and her son, Christine Sutton ,understands Dr. Abbey Chatters will be performing the examination tomorrow.  All parties agreeable.    Further recommendations to follow tomorrow.     Notice:  This dictation was prepared with Dragon dictation along with smaller phrase technology. Any transcriptional errors that result from this process are unintentional and may not be corrected upon review.

## 2022-01-16 NOTE — H&P (Signed)
History and Physical    Patient: ARLYSS WEATHERSBY BSW:[id number] DOB: 18-Apr-1930 DOA: 01/15/2022 DOS: the patient was seen and examined on 01/16/2022 PCP: Asencion Noble, MD  Patient coming from: Home  Chief Complaint:  Chief Complaint  Patient presents with   Rectal Bleeding   HPI: ANIYA JOLICOEUR is a 88 y.o. female with medical history significant of breast cancer status post bilateral mastectomy, diabetes mellitus, hypertension, and more presents to ED with a chief complaint of blood in stool.  Patient reports that she has had blood in her stool for a week.  On for 6 days it was red blood, and then on the seventh it was melena.  Patient reports that she has been getting progressively weaker over all of these days.  She also reports that she has been having trouble with her memory since she started bleeding, but does not normally have memory loss/dementia.  Patient reports that she has pain just before the bowel movements in her left lower quadrant and right lower quadrant.  It is sharp pain.  If she presses down on the area, it helps to relieve the pain.  She denies any shortness of breath, chest pain.  She admits to near syncope, especially when going from sitting to standing.  She has no paresthesias.  Patient has not noticed bleeding anywhere else.  Patient has no other complaints at this time.  Her weakness is generalized and not focal.  Patient does not smoke, does not drink, does not use illicit drugs.  She is DNR. Review of Systems: As mentioned in the history of present illness. All other systems reviewed and are negative. Past Medical History:  Diagnosis Date   Breast disorder    Diabetes mellitus ([place])    Family history of breast cancer    Family history of colon cancer    Family history of lung cancer    High blood cholesterol level    HX: breast cancer    left   Hypertension    Invasive ductal carcinoma of left breast ([place]) 01/27/2014   ER +   Past Surgical History:  Procedure  Laterality Date   BACK SURGERY     lower   CATARACT EXTRACTION, BILATERAL  11/2014   MASTECTOMY Left    SENTINEL NODE BIOPSY Right 05/15/2020   Procedure: SENTINEL NODE BIOPSY;  Surgeon: Virl Cagey, MD;  Location: [facility];  Service: General;  Laterality: Right;   TOTAL MASTECTOMY Right 05/15/2020   Procedure: TOTAL MASTECTOMY RIGHT BREAST;  Surgeon: Virl Cagey, MD;  Location: [facility];  Service: General;  Laterality: Right;   Social History:  reports that she has never smoked. She has never used smokeless tobacco. She reports that she does not drink alcohol and does not use drugs.  Allergies  Allergen Reactions   Codeine Nausea And Vomiting    Family History  Problem Relation Age of Onset   Cancer Mother    Breast cancer Mother    Colon cancer Mother    Cancer Sister    Breast cancer Sister    Cancer Sister    Lung cancer Father     Prior to Admission medications   Medication Sig Start Date End Date Taking? Authorizing Provider  acetaminophen (TYLENOL) 500 MG tablet Take 500 mg by mouth every 6 (six) hours as needed for moderate pain or headache.    [provider]  amLODipine (NORVASC) 5 MG tablet Take 5 mg by mouth at bedtime.  [provider]  aspirin 81 MG chewable tablet Chew 81 mg by mouth at bedtime.    [provider]  calcium carbonate (OSCAL) 1500 (600 Ca) MG TABS tablet Take 600 mg of elemental calcium by mouth daily.    [provider]  diphenhydramine-acetaminophen (TYLENOL PM) 25-500 MG TABS Take 1 tablet by mouth at bedtime.     [provider]  gabapentin (NEURONTIN) 300 MG capsule Take 300 mg by mouth 3 (three) times daily as needed (pain). 09/30/19   [provider]  lisinopril-hydrochlorothiazide (ZESTORETIC) 20-12.5 MG tablet Take 2 tablets by mouth daily.    [provider]  metFORMIN (GLUCOPHAGE-XR) 500 MG 24 hr tablet Take 500 mg by mouth 2 (two) times daily.  12/12/16   [provider]  Multiple Vitamins-Minerals (CENTRUM SILVER PO) Take 1 tablet by mouth daily.    [provider]  nitrofurantoin (MACRODANTIN) 100 MG capsule Take 100 mg by mouth daily. 11/23/21   [provider]  Polyethyl Glycol-Propyl Glycol (SYSTANE OP) Place 1 drop into both eyes daily as needed (dry eyes).    [provider]  polyethylene glycol (MIRALAX / GLYCOLAX) packet Take 17 g by mouth at bedtime.     [provider]    Physical Exam: Vitals:   01/15/22 2230 01/15/22 2300 01/16/22 0018 01/16/22 0027  BP: 130/61 (!) 148/69 (!) 160/71   Pulse: 97 96 97   Resp: '16 16 18   '$ Temp: 98.4 F (36.9 C)  97.7 F (36.5 C)   TempSrc:   Oral   SpO2: 96% 96% 96%   Weight:    62.8 kg  Height:    '5\' 5"'$  (1.651 m)   1.  General: Patient lying supine in bed,  no acute distress   2. Psychiatric: Alert and oriented x 3, mood and behavior normal for situation, pleasant and cooperative with exam   3. Neurologic: Speech and language are normal, face is symmetric, moves all 4 extremities voluntarily, at baseline without acute deficits on limited exam   4. HEENMT:  Head is atraumatic, normocephalic, pupils reactive to light, neck is supple, trachea is midline, mucous membranes are moist   5. Respiratory : Lungs are clear to auscultation bilaterally without wheezing, rhonchi, rales, no cyanosis, no increase in work of breathing or accessory muscle use   6. Cardiovascular : Heart rate normal, rhythm is regular, no murmurs, rubs or gallops, no peripheral edema, peripheral pulses palpated   7. Gastrointestinal:  Abdomen is soft, nondistended, minimally tender in the left  lower quadrant, bowel sounds active, no masses or organomegaly palpated   8. Skin:  Skin is warm, dry and intact without rashes, acute lesions, or ulcers on limited exam   9.Musculoskeletal:  No acute deformities or trauma, no asymmetry in tone, no peripheral edema, peripheral pulses  palpated, no tenderness to palpation in the extremities  Data Reviewed: In the ED 98.6, heart rate 87-99, respiratory rate 16, blood pressure 122/63-162/69 No leukocytosis with a white blood cell count of 7.6, hemoglobin 7.4, platelets 474 Chemistry reveals an AKI with a creatinine of 1.13 up from 0.78 and hyperglycemia CTA abdomen pelvis shows no active hemorrhage.  Extensive colonic diverticulosis without diverticulitis.  Moderate amount of retained stool suggesting constipation.  Hepatic cysts and steatosis.  Renal cysts.  Pancreatic cyst. GI was consulted and recommends Protonix, clear liquids, they will see in the a.m.   Assessment and Plan: * GI bleed - Bright red blood per rectum once a  day for 6 days.  On the seventh day melena -Hemoglobin dropped to 7.4 -GI consulted and recommends clear liquid diet with plan for them to see her in the a.m. -Protonix started -Patient typed and screened -Trend CBCs every 8 hours -Continue to monitor  AKI (acute kidney injury) ([place]) - Secondary to anemia - Creatinine increased from 0.78>> 1.13 - Continue IV hydration throughout the night - Trend in the a.m. - Hold nephrotoxic agents when possible  Blood loss anemia - Acute blood loss anemia with hemoglobin drop from 8.3>> 7.4 in 4 days, and previous to that hemoglobin was 12.8 in May 2023 -GI bleed -GI to see patient in the a.m.  Hypertension, essential, benign - BP currently controlled at 122/63 -Holding antihypertensives in the setting of blood loss anemia, as she is at risk of hypotension -We will add back antihypertensives as needed  Diabetes mellitus due to underlying condition without complications (HCC) - Hold metformin -Sliding scale coverage -Clear liquid diet -Hemoglobin A1c in the a.m. -Continue to monitor       Advance Care Planning:   Code Status: DNR   Consults: Gastro  Family Communication: Son at bedside  Severity of Illness: The appropriate patient  status for this patient is INPATIENT. Inpatient status is judged to be reasonable and necessary in order to provide the required intensity of service to ensure the patient's safety. The patient's presenting symptoms, physical exam findings, and initial radiographic and laboratory data in the context of their chronic comorbidities is felt to place them at high risk for further clinical deterioration. Furthermore, it is not anticipated that the patient will be medically stable for discharge from the hospital within 2 midnights of admission.   * I certify that at the point of admission it is my clinical judgment that the patient will require inpatient hospital care spanning beyond 2 midnights from the point of admission due to high intensity of service, high risk for further deterioration and high frequency of surveillance required.*  Author: Rolla Plate, DO 01/16/2022 12:48 AM  For on call review [email or web address].

## 2022-01-16 NOTE — Assessment & Plan Note (Signed)
-   BP currently controlled at 122/63 -Holding antihypertensives in the setting of blood loss anemia, as she is at risk of hypotension -We will add back antihypertensives as needed

## 2022-01-17 DIAGNOSIS — K922 Gastrointestinal hemorrhage, unspecified: Secondary | ICD-10-CM | POA: Diagnosis not present

## 2022-01-17 DIAGNOSIS — D500 Iron deficiency anemia secondary to blood loss (chronic): Secondary | ICD-10-CM | POA: Diagnosis not present

## 2022-01-17 DIAGNOSIS — E089 Diabetes mellitus due to underlying condition without complications: Secondary | ICD-10-CM | POA: Diagnosis not present

## 2022-01-17 DIAGNOSIS — N179 Acute kidney failure, unspecified: Secondary | ICD-10-CM | POA: Diagnosis not present

## 2022-01-17 LAB — CBC
HCT: 26.4 % — ABNORMAL LOW (ref 36.0–46.0)
Hemoglobin: 8.5 g/dL — ABNORMAL LOW (ref 12.0–15.0)
MCH: 28.1 pg (ref 26.0–34.0)
MCHC: 32.2 g/dL (ref 30.0–36.0)
MCV: 87.1 fL (ref 80.0–100.0)
Platelets: 447 10*3/uL — ABNORMAL HIGH (ref 150–400)
RBC: 3.03 MIL/uL — ABNORMAL LOW (ref 3.87–5.11)
RDW: 15.1 % (ref 11.5–15.5)
WBC: 6.2 10*3/uL (ref 4.0–10.5)
nRBC: 0 % (ref 0.0–0.2)

## 2022-01-17 LAB — GLUCOSE, CAPILLARY
Glucose-Capillary: 113 mg/dL — ABNORMAL HIGH (ref 70–99)
Glucose-Capillary: 154 mg/dL — ABNORMAL HIGH (ref 70–99)
Glucose-Capillary: 96 mg/dL (ref 70–99)
Glucose-Capillary: 137 mg/dL — ABNORMAL HIGH (ref 70–99)

## 2022-01-17 LAB — FERRITIN: Ferritin: 10 ng/mL — ABNORMAL LOW (ref 11–307)

## 2022-01-17 LAB — IRON AND TIBC
Iron: 19 ug/dL — ABNORMAL LOW (ref 28–170)
Saturation Ratios: 5 % — ABNORMAL LOW (ref 10.4–31.8)
TIBC: 399 ug/dL (ref 250–450)
UIBC: 380 ug/dL

## 2022-01-17 MED ORDER — PEG 3350-KCL-NA BICARB-NACL 420 G PO SOLR
4000.0000 mL | Freq: Once | ORAL | Status: AC
Start: 2022-01-17 — End: 2022-01-17
  Administered 2022-01-17: 4000 mL via ORAL

## 2022-01-17 MED ORDER — LISINOPRIL 10 MG PO TABS: 20.0000 mg | ORAL_TABLET | Freq: Every day | ORAL | Status: DC

## 2022-01-17 MED ORDER — AMLODIPINE BESYLATE 5 MG PO TABS
5.0000 mg | ORAL_TABLET | Freq: Every day | ORAL | Status: DC
  Administered 2022-01-17: 5 mg via ORAL
  Filled 2022-01-17: qty 1

## 2022-01-17 NOTE — Progress Notes (Signed)
PROGRESS NOTE    Christine Sutton  FOY:[id number] DOB: Oct 28, 1929 DOA: 01/15/2022 PCP: Asencion Noble, MD    Brief Narrative:  88 year old female presents to the hospital with rectal bleeding.  Found to have significant anemia with a hemoglobin of 7.4.  Transfuse 1 unit PRBC during her hospital stay.  GI following and plans on EGD/colonoscopy during hospital stay.   Assessment & Plan:   Principal Problem:   GI bleed Active Problems:   Diabetes mellitus due to underlying condition without complications (HCC)   Hypertension, essential, benign   Blood loss anemia   AKI (acute kidney injury) ([place])   GI bleeding -She was having bright red blood per rectum prior to admission -She has had a significant decline in hemoglobin from baseline -Currently on Protonix -GI following -Plans are for EGD/colonoscopy in a.m.  Acute blood loss anemia Iron deficiency anemia -Baseline hemoglobin of 12.8 in 10/2021 -Admission hemoglobin noted to be 7.4 -Hemoglobin down to 7.0 during her stay -Transfused 1 unit PRBC on 8/6 -Continue to follow hemoglobin -Will likely to be started on iron prior to discharge  AKI -Baseline creatinine 0.7 -Admission creatinine 1.1 -Likely related to volume depletion from GI bleeding -Follow-up creatinine improved to 0.7 today  Hypertension -Resume home dose of antihypertensives since blood pressure is trending up  Diabetes -Holding home dose of metformin -Continue sliding scale -A1c 6.0   DVT prophylaxis: SCDs Start: 01/16/22 0037  Code Status: DNR Family Communication: Updated patient's son at the bedside Disposition Plan: Status is: Inpatient Remains inpatient appropriate because: Further work-up of GI bleeding including EGD/colonoscopy     Consultants:  Gastroenterology  Procedures:    Antimicrobials:      Subjective: She feels better after transfusion of PRBC overnight.  She feels as though she has more energy.  Objective: Vitals:    01/17/22 0128 01/17/22 0441 01/17/22 0515 01/17/22 1220  BP: 133/64  (!) 150/61 (!) 160/61  Pulse: 88  86 87  Resp: '16  17 18  '$ Temp: 98.1 F (36.7 C)  98.3 F (36.8 C) 97.7 F (36.5 C)  TempSrc: Oral  Oral Oral  SpO2: 96%  96% 97%  Weight:  62.4 kg    Height:        Intake/Output Summary (Last 24 hours) at 01/17/2022 1853 Last data filed at 01/17/2022 1755 Gross per 24 hour  Intake 3399.1 ml  Output --  Net 3399.1 ml   Filed Weights   01/16/22 0027 01/16/22 0412 01/17/22 0441  Weight: 62.8 kg 62.8 kg 62.4 kg    Examination:  General exam: Appears calm and comfortable  Respiratory system: Clear to auscultation. Respiratory effort normal. Cardiovascular system: S1 & S2 heard, RRR. No JVD, murmurs, rubs, gallops or clicks. No pedal edema. Gastrointestinal system: Abdomen is nondistended, soft and nontender. No organomegaly or masses felt. Normal bowel sounds heard. Central nervous system: Alert and oriented. No focal neurological deficits. Extremities: Symmetric 5 x 5 power. Skin: No rashes, lesions or ulcers Psychiatry: Judgement and insight appear normal. Mood & affect appropriate.     Data Reviewed: I have personally reviewed following labs and imaging studies  CBC: Recent Labs  Lab 01/11/22 1213 01/14/22 0823 01/15/22 1911 01/16/22 0325 01/16/22 0814 01/16/22 1613 01/17/22 0755  WBC 8.9 6.8 7.6 7.5 8.5 9.9 6.2  NEUTROABS 6.5 4.4  --   --   --   --   --   HGB 8.3* 7.4* 7.4* 7.6* 7.2* 7.0* 8.5*  HCT 25.3* 22.2* 23.0* 23.9* 22.3* 22.1*  26.4*  MCV 89.1 86 89.8 89.8 88.8 89.1 87.1  PLT 284 425 474* 465* 429* 419* 295*   Basic Metabolic Panel: Recent Labs  Lab 01/11/22 1213 01/15/22 1911 01/16/22 0327  NA 132* 133* 135  K 4.2 3.7 3.9  CL 98 99 103  CO2 '27 26 25  '$ GLUCOSE 191* 217* 156*  BUN '18 20 17  '$ CREATININE 0.78 1.13* 0.75  CALCIUM 9.4 9.5 9.4  MG  --   --  1.8   GFR: Estimated Creatinine Clearance: 42.9 mL/min (by C-G formula based on SCr of 0.75  mg/dL). Liver Function Tests: Recent Labs  Lab 01/11/22 1213 01/15/22 1911 01/16/22 0327  AST 14* 22 22  ALT '14 25 25  '$ ALKPHOS 56 58 56  BILITOT 0.3 0.2* 0.4  PROT 5.9* 6.5 6.7  ALBUMIN 3.6 3.8 3.8   No results for input(s): "LIPASE", "AMYLASE" in the last 168 hours. No results for input(s): "AMMONIA" in the last 168 hours. Coagulation Profile: Recent Labs  Lab 01/16/22 0327  INR 1.0   Cardiac Enzymes: No results for input(s): "CKTOTAL", "CKMB", "CKMBINDEX", "TROPONINI" in the last 168 hours. BNP (last 3 results) No results for input(s): "PROBNP" in the last 8760 hours. HbA1C: Recent Labs    01/16/22 0326  HGBA1C 6.0*   CBG: Recent Labs  Lab 01/16/22 1639 01/16/22 2059 01/17/22 0754 01/17/22 1106 01/17/22 1614  GLUCAP 135* 120* 113* 154* 96   Lipid Profile: No results for input(s): "CHOL", "HDL", "LDLCALC", "TRIG", "CHOLHDL", "LDLDIRECT" in the last 72 hours. Thyroid Function Tests: No results for input(s): "TSH", "T4TOTAL", "FREET4", "T3FREE", "THYROIDAB" in the last 72 hours. Anemia Panel: Recent Labs    01/17/22 0851  FERRITIN 10*  TIBC 399  IRON 19*   Sepsis Labs: No results for input(s): "PROCALCITON", "LATICACIDVEN" in the last 168 hours.  No results found for this or any previous visit (from the past 240 hour(s)).       Radiology Studies: CT Angio Abd/Pel W and/or Wo Contrast  Result Date: 01/15/2022 CLINICAL DATA:  GI bleed, lower.  Bright red blood stools, weakness. EXAM: CTA ABDOMEN AND PELVIS WITHOUT AND WITH CONTRAST TECHNIQUE: Multidetector CT imaging of the abdomen and pelvis was performed using the standard protocol during bolus administration of intravenous contrast. Multiplanar reconstructed images and MIPs were obtained and reviewed to evaluate the vascular anatomy. RADIATION DOSE REDUCTION: This exam was performed according to the departmental dose-optimization program which includes automated exposure control, adjustment of the mA  and/or kV according to patient size and/or use of iterative reconstruction technique. CONTRAST:  14m OMNIPAQUE IOHEXOL 350 MG/ML SOLN COMPARISON:  12/04/2020, 01/11/2022. FINDINGS: VASCULAR Aorta: Aortic atherosclerosis. Normal caliber aorta without aneurysm, dissection, vasculitis or significant stenosis. Celiac: Mild atherosclerotic calcification. Patent without evidence of aneurysm, dissection, vasculitis or significant stenosis. SMA: Mild atherosclerotic calcification. Patent without evidence of aneurysm, dissection, vasculitis or significant stenosis. Renals: Mild atherosclerotic calcification. Both renal arteries are patent without evidence of aneurysm, dissection, vasculitis, fibromuscular dysplasia or significant stenosis. IMA: Patent. Inflow: Mild atherosclerotic calcification. Patent without evidence of aneurysm, dissection, vasculitis or significant stenosis. Proximal Outflow: Mild atherosclerotic calcification. Bilateral common femoral and visualized portions of the superficial and profunda femoral arteries are patent without evidence of aneurysm, dissection, vasculitis or significant stenosis. Veins: No obvious venous abnormality within the limitations of this arterial phase study. Review of the MIP images confirms the above findings. NON-VASCULAR Lower chest: A few scattered coronary artery calcifications are noted. Atelectasis or scarring is present at the lung bases.  Hepatobiliary: Scattered hypodensities are present in the liver, the largest are cystic in attenuation. Hepatic steatosis is noted. The gallbladder is without stones. No biliary ductal dilatation. Pancreas: Cysts are present in the pancreatic body measuring 1.1 and 1.6 cm, slightly decreased from the prior exam. There is a cyst in the pancreatic tail measuring 9 mm, also decreased from the prior exam. No pancreatic ductal dilatation or surrounding inflammatory changes Spleen: Normal in size without focal abnormality. Adrenals/Urinary  Tract: No adrenal nodule or mass. The kidneys enhance symmetrically. Hypodensities are noted in the kidneys bilaterally, previously characterized on cysts on recent MRI. No renal calculus or hydronephrosis. The bladder is unremarkable. Stomach/Bowel: There is a small hiatal hernia. No bowel obstruction, free air, or pneumatosis. Extensive colonic diverticulosis without diverticulitis. There is a moderate amount of retained stool in the colon. The appendix is not visualized on exam. No acute hemorrhage or active contrast extravasation is seen. Lymphatic: No abdominal or pelvic lymphadenopathy by size criteria. Reproductive: Status post hysterectomy. No adnexal masses. Other: No abdominopelvic ascites. Fat containing periumbilical hernia. Musculoskeletal: Dextroscoliosis and degenerative changes in the thoracolumbar spine. Lumbar spinal fusion hardware is noted at L4-L5. IMPRESSION: VASCULAR 1. No evidence contrast extravasation or active hemorrhage. 2. Aortic atherosclerosis without evidence of aneurysm or dissection. NON-VASCULAR 1. Extensive colonic diverticulosis without diverticulitis. 2. Moderate amount of retained stool in the colon suggesting constipation. 3. Hepatic cysts and steatosis. 4. Renal cysts. 5. Pancreatic cysts, slightly decreased in size from the prior exam. Electronically Signed   By: Brett Fairy M.D.   On: 01/15/2022 21:57        Scheduled Meds:  acetaminophen  500 mg Oral QHS   And   diphenhydrAMINE  25 mg Oral QHS   insulin aspart  0-15 Units Subcutaneous TID WC   pantoprazole (PROTONIX) IV  40 mg Intravenous Q12H   Continuous Infusions:   LOS: 2 days    Time spent: 26mns    JKathie Dike MD [facility]   If 7PM-7AM, please contact night-coverage [email or web address]  01/17/2022, 6:53 PM

## 2022-01-17 NOTE — Progress Notes (Signed)
Subjective: Patient did not receive bowel prep yesterday.  She reports her last bowel movement was early yesterday morning.  No BRBPR or melena.  She last experienced rectal bleeding sometime last week.  She noted a black stool on Friday, but has not had any recurrent dark stools.  Reports she only feels some intermittent pressure in her left lower quadrant if she holds her urine.  No other significant abdominal pain.  She had been experiencing some intermittent left-sided abdominal pain for the last couple of weeks. No nausea or vomiting. Denies weakness, lightheadedness.   Objective: Vital signs in last 24 hours: Temp:  [98 F (36.7 C)-98.7 F (37.1 C)] 98.3 F (36.8 C) (08/07 0515) Pulse Rate:  [86-99] 86 (08/07 0515) Resp:  [16-18] 17 (08/07 0515) BP: (133-150)/(59-66) 150/61 (08/07 0515) SpO2:  [94 %-99 %] 96 % (08/07 0515) Weight:  [62.4 kg] 62.4 kg (08/07 0441) Last BM Date : 01/16/22 General:   Alert and oriented, pleasant, NAD Head:  Normocephalic and atraumatic. Eyes:  No icterus, sclera clear. Conjuctiva pink.  Abdomen:  Bowel sounds present, soft, non-distended.  Minimal TTP in suprapubic area.  No HSM or hernias noted. No rebound or guarding. No masses appreciated  Msk:  Symmetrical without gross deformities. Normal posture. Extremities:  Without edema. Neurologic:  Alert and  oriented x4;  grossly normal neurologically. Psych:  Normal mood and affect.  Intake/Output from previous day: 08/06 0701 - 08/07 0700 In: 3715.2 [P.O.:1908; I.V.:1401.2; Blood:406] Out: -  Intake/Output this shift: No intake/output data recorded.  Lab Results: Recent Labs    01/16/22 0325 01/16/22 0814 01/16/22 1613  WBC 7.5 8.5 9.9  HGB 7.6* 7.2* 7.0*  HCT 23.9* 22.3* 22.1*  PLT 465* 429* 419*   BMET Recent Labs    01/15/22 1911 01/16/22 0327  NA 133* 135  K 3.7 3.9  CL 99 103  CO2 26 25  GLUCOSE 217* 156*  BUN 20 17  CREATININE 1.13* 0.75  CALCIUM 9.5 9.4    LFT Recent Labs    01/15/22 1911 01/16/22 0327  PROT 6.5 6.7  ALBUMIN 3.8 3.8  AST 22 22  ALT 25 25  ALKPHOS 58 56  BILITOT 0.2* 0.4   PT/INR Recent Labs    01/16/22 0327  LABPROT 12.7  INR 1.0   Studies/Results: CT Angio Abd/Pel W and/or Wo Contrast  Result Date: 01/15/2022 CLINICAL DATA:  GI bleed, lower.  Bright red blood stools, weakness. EXAM: CTA ABDOMEN AND PELVIS WITHOUT AND WITH CONTRAST TECHNIQUE: Multidetector CT imaging of the abdomen and pelvis was performed using the standard protocol during bolus administration of intravenous contrast. Multiplanar reconstructed images and MIPs were obtained and reviewed to evaluate the vascular anatomy. RADIATION DOSE REDUCTION: This exam was performed according to the departmental dose-optimization program which includes automated exposure control, adjustment of the mA and/or kV according to patient size and/or use of iterative reconstruction technique. CONTRAST:  180m OMNIPAQUE IOHEXOL 350 MG/ML SOLN COMPARISON:  12/04/2020, 01/11/2022. FINDINGS: VASCULAR Aorta: Aortic atherosclerosis. Normal caliber aorta without aneurysm, dissection, vasculitis or significant stenosis. Celiac: Mild atherosclerotic calcification. Patent without evidence of aneurysm, dissection, vasculitis or significant stenosis. SMA: Mild atherosclerotic calcification. Patent without evidence of aneurysm, dissection, vasculitis or significant stenosis. Renals: Mild atherosclerotic calcification. Both renal arteries are patent without evidence of aneurysm, dissection, vasculitis, fibromuscular dysplasia or significant stenosis. IMA: Patent. Inflow: Mild atherosclerotic calcification. Patent without evidence of aneurysm, dissection, vasculitis or significant stenosis. Proximal Outflow: Mild atherosclerotic calcification. Bilateral common femoral and  visualized portions of the superficial and profunda femoral arteries are patent without evidence of aneurysm, dissection,  vasculitis or significant stenosis. Veins: No obvious venous abnormality within the limitations of this arterial phase study. Review of the MIP images confirms the above findings. NON-VASCULAR Lower chest: A few scattered coronary artery calcifications are noted. Atelectasis or scarring is present at the lung bases. Hepatobiliary: Scattered hypodensities are present in the liver, the largest are cystic in attenuation. Hepatic steatosis is noted. The gallbladder is without stones. No biliary ductal dilatation. Pancreas: Cysts are present in the pancreatic body measuring 1.1 and 1.6 cm, slightly decreased from the prior exam. There is a cyst in the pancreatic tail measuring 9 mm, also decreased from the prior exam. No pancreatic ductal dilatation or surrounding inflammatory changes Spleen: Normal in size without focal abnormality. Adrenals/Urinary Tract: No adrenal nodule or mass. The kidneys enhance symmetrically. Hypodensities are noted in the kidneys bilaterally, previously characterized on cysts on recent MRI. No renal calculus or hydronephrosis. The bladder is unremarkable. Stomach/Bowel: There is a small hiatal hernia. No bowel obstruction, free air, or pneumatosis. Extensive colonic diverticulosis without diverticulitis. There is a moderate amount of retained stool in the colon. The appendix is not visualized on exam. No acute hemorrhage or active contrast extravasation is seen. Lymphatic: No abdominal or pelvic lymphadenopathy by size criteria. Reproductive: Status post hysterectomy. No adnexal masses. Other: No abdominopelvic ascites. Fat containing periumbilical hernia. Musculoskeletal: Dextroscoliosis and degenerative changes in the thoracolumbar spine. Lumbar spinal fusion hardware is noted at L4-L5. IMPRESSION: VASCULAR 1. No evidence contrast extravasation or active hemorrhage. 2. Aortic atherosclerosis without evidence of aneurysm or dissection. NON-VASCULAR 1. Extensive colonic diverticulosis without  diverticulitis. 2. Moderate amount of retained stool in the colon suggesting constipation. 3. Hepatic cysts and steatosis. 4. Renal cysts. 5. Pancreatic cysts, slightly decreased in size from the prior exam. Electronically Signed   By: Brett Fairy M.D.   On: 01/15/2022 21:57    Assessment: 88 year old female with history of diabetes, HTN, breast cancer, presenting to the emergency room with intermittent bright red blood per rectum over the last week with associated intermittent LLQ abdominal pain, single episode of melanotic stool, perceived insidiously worsening constipation, progressive weakness, and found to be significantly anemic with hemoglobin of 7.4, down from 12.8, 3 months ago.  CT angio A/P with diverticulosis, increase stool burden on the left side, patent mesenteric arteries.  Hemoglobin declined to 7.0 yesterday and she received 1 unit PRBCs.  Plan to repeat CBC this morning and check iron panel.  Clinically, she is feeling fairly well with no recurrent BRBPR or melanotic stools since Friday.  Last bowel movement was yesterday morning.  Overall, suspect bleeding is from the lower GI tract with differentials including benign anorectal source such as hemorrhoids in the setting of constipation, low-grade ischemia secondary to chronic constipation, and cannot rule out colon polyps or malignancy.  With reports of black stool, unable to rule out upper GI source.  BUN has been within normal limits.   Plan: CBC, iron panel with ferritin Clear liquid diet today. N.p.o. at midnight except for sips with meds. Start bowel prep around 6 p.m. Colonoscopy and possible EGD with Dr. Jenetta Downer tomorrow. The risks, benefits, and alternatives have been discussed with the patient in detail. The patient states understanding and desires to proceed.  Continue empiric IV PPI BID for now.  Continue to trend H&H. Continue to monitor for overt GI bleeding.   LOS: 2 days    01/17/2022, 8:32  AM   Aliene Altes, PA-C [facility]

## 2022-01-17 NOTE — Progress Notes (Signed)
Patient had a hgb of 7.0, blood consent obtained and received 1 unit PRBC without issues no adverse reaction however did receive a new 22g IV while receiving PRBC IV started leaking. Patient has been NPO since midnight for scheduled colonoscopy 01/17/22, consent obtained by patient. Patient using B[facility].

## 2022-01-18 ENCOUNTER — Inpatient Hospital Stay: Payer: Medicare Other | Admitting: Hematology

## 2022-01-18 ENCOUNTER — Inpatient Hospital Stay (HOSPITAL_COMMUNITY): Payer: Medicare Other | Admitting: Anesthesiology

## 2022-01-18 ENCOUNTER — Encounter (HOSPITAL_COMMUNITY): Admission: RE | Admit: 2022-01-18 | Payer: Medicare Other | Source: Ambulatory Visit

## 2022-01-18 ENCOUNTER — Encounter (HOSPITAL_COMMUNITY): Payer: Self-pay | Admitting: Family Medicine

## 2022-01-18 ENCOUNTER — Encounter (HOSPITAL_COMMUNITY)
Admission: EM | Disposition: A | Discharge status: Home / Self Care | Payer: Self-pay | Source: Home / Self Care | Attending: Internal Medicine

## 2022-01-18 DIAGNOSIS — N179 Acute kidney failure, unspecified: Secondary | ICD-10-CM | POA: Diagnosis not present

## 2022-01-18 DIAGNOSIS — D500 Iron deficiency anemia secondary to blood loss (chronic): Secondary | ICD-10-CM | POA: Diagnosis not present

## 2022-01-18 DIAGNOSIS — K449 Diaphragmatic hernia without obstruction or gangrene: Secondary | ICD-10-CM

## 2022-01-18 DIAGNOSIS — K5710 Diverticulosis of small intestine without perforation or abscess without bleeding: Secondary | ICD-10-CM

## 2022-01-18 DIAGNOSIS — E089 Diabetes mellitus due to underlying condition without complications: Secondary | ICD-10-CM | POA: Diagnosis not present

## 2022-01-18 DIAGNOSIS — K921 Melena: Principal | ICD-10-CM

## 2022-01-18 DIAGNOSIS — K922 Gastrointestinal hemorrhage, unspecified: Secondary | ICD-10-CM | POA: Diagnosis not present

## 2022-01-18 HISTORY — PX: ESOPHAGOGASTRODUODENOSCOPY (EGD) WITH PROPOFOL: SHX5813

## 2022-01-18 HISTORY — PX: COLONOSCOPY WITH PROPOFOL: SHX5780

## 2022-01-18 LAB — CBC
HCT: 27.8 % — ABNORMAL LOW (ref 36.0–46.0)
Hemoglobin: 9.1 g/dL — ABNORMAL LOW (ref 12.0–15.0)
MCH: 28.6 pg (ref 26.0–34.0)
MCHC: 32.7 g/dL (ref 30.0–36.0)
MCV: 87.4 fL (ref 80.0–100.0)
Platelets: 472 10*3/uL — ABNORMAL HIGH (ref 150–400)
RBC: 3.18 MIL/uL — ABNORMAL LOW (ref 3.87–5.11)
RDW: 15 % (ref 11.5–15.5)
WBC: 9.1 10*3/uL (ref 4.0–10.5)
nRBC: 0 % (ref 0.0–0.2)

## 2022-01-18 LAB — BASIC METABOLIC PANEL
Anion gap: 6 (ref 5–15)
BUN: 6 mg/dL — ABNORMAL LOW (ref 8–23)
CO2: 24 mmol/L (ref 22–32)
Calcium: 8.9 mg/dL (ref 8.9–10.3)
Chloride: 110 mmol/L (ref 98–111)
Creatinine, Ser: 0.58 mg/dL (ref 0.44–1.00)
GFR, Estimated: >60 mL/min (ref >60)
Glucose, Bld: 112 mg/dL — ABNORMAL HIGH (ref 70–99)
Potassium: 3.9 mmol/L (ref 3.5–5.1)
Sodium: 140 mmol/L (ref 135–145)

## 2022-01-18 LAB — GLUCOSE, CAPILLARY
Glucose-Capillary: 128 mg/dL — ABNORMAL HIGH (ref 70–99)
Glucose-Capillary: 114 mg/dL — ABNORMAL HIGH (ref 70–99)
Glucose-Capillary: 120 mg/dL — ABNORMAL HIGH (ref 70–99)

## 2022-01-18 SURGERY — COLONOSCOPY WITH PROPOFOL
Anesthesia: General

## 2022-01-18 MED ORDER — SODIUM CHLORIDE 0.9 % IV SOLN: INTRAVENOUS | Status: DC

## 2022-01-18 MED ORDER — PROPOFOL 10 MG/ML IV BOLUS
INTRAVENOUS | Status: DC | PRN
  Administered 2022-01-18: 20 mg via INTRAVENOUS
  Administered 2022-01-18: 10 mg via INTRAVENOUS
  Administered 2022-01-18: 50 mg via INTRAVENOUS
  Administered 2022-01-18 (×4): 10 mg via INTRAVENOUS

## 2022-01-18 MED ORDER — LIDOCAINE HCL (CARDIAC) PF 100 MG/5ML IV SOSY
PREFILLED_SYRINGE | INTRAVENOUS | Status: DC | PRN
  Administered 2022-01-18: 50 mg via INTRATRACHEAL

## 2022-01-18 MED ORDER — FERROUS GLUCONATE 324 (38 FE) MG PO TABS: 324.0000 mg | ORAL_TABLET | Freq: Every day | ORAL | 3 refills | Status: DC

## 2022-01-18 MED ORDER — PROPOFOL 500 MG/50ML IV EMUL
INTRAVENOUS | Status: DC | PRN
  Administered 2022-01-18: 200 ug/kg/min via INTRAVENOUS

## 2022-01-18 MED ORDER — LACTATED RINGERS IV SOLN
INTRAVENOUS | Status: DC
  Administered 2022-01-18: 1000 mL via INTRAVENOUS

## 2022-01-18 MED ORDER — PHENYLEPHRINE 80 MCG/ML (10ML) SYRINGE FOR IV PUSH (FOR BLOOD PRESSURE SUPPORT)
PREFILLED_SYRINGE | INTRAVENOUS | Status: DC | PRN
  Administered 2022-01-18: 80 ug via INTRAVENOUS
  Administered 2022-01-18: 40 ug via INTRAVENOUS

## 2022-01-18 MED ORDER — PHENYLEPHRINE 80 MCG/ML (10ML) SYRINGE FOR IV PUSH (FOR BLOOD PRESSURE SUPPORT)
PREFILLED_SYRINGE | INTRAVENOUS | Status: AC
  Filled 2022-01-18: qty 10

## 2022-01-18 NOTE — Progress Notes (Signed)
We will proceed with EGD and colonoscopy as scheduled.  I thoroughly discussed with the patient his procedure, including the risks involved. Patient understands what the procedure involves including the benefits and any risks. Patient understands alternatives to the proposed procedure. Risks including (but not limited to) bleeding, tearing of the lining (perforation), rupture of adjacent organs, problems with heart and lung function, infection, and medication reactions. A small percentage of complications may require surgery, hospitalization, repeat endoscopic procedure, and/or transfusion.  Patient understood and agreed. ? ?Aeden Matranga Castaneda, MD ?Gastroenterology and Hepatology ?[facility] ? ?

## 2022-01-18 NOTE — Care Management Important Message (Signed)
Important Message  Patient Details  Name: Christine Sutton MRN: [id number] Date of Birth: May 18, 1930   Medicare Important Message Given:  N/A - LOS <3 / Initial given by admissions     Tommy Medal 01/18/2022, 11:36 AM

## 2022-01-18 NOTE — Anesthesia Preprocedure Evaluation (Addendum)
Anesthesia Evaluation  Patient identified by MRN, date of birth, ID band Patient awake    Reviewed: Allergy & Precautions, NPO status , Patient's Chart, lab work & pertinent test results  Airway Mallampati: II  TM Distance: >3 FB Neck ROM: Full    Dental  (+) Dental Advisory Given, Caps   Pulmonary neg pulmonary ROS, Patient abstained from smoking.,    Pulmonary exam normal breath sounds clear to auscultation       Cardiovascular Exercise Tolerance: Good hypertension, Pt. on medications Normal cardiovascular exam Rhythm:Regular Rate:Normal  13-May-2020 08:03:52 [facility] ROUTINE RECORD Normal sinus rhythm Normal ECG Confirmed by Asencion Noble [phone]) on 05/14/2020 8:11:57 PM   Neuro/Psych  Neuromuscular disease negative psych ROS   GI/Hepatic negative GI ROS, Neg liver ROS,   Endo/Other  diabetes, Well Controlled, Type 2, Oral Hypoglycemic Agents  Renal/GU Renal InsufficiencyRenal disease  negative genitourinary   Musculoskeletal  (+) Arthritis , Osteoarthritis,    Abdominal   Peds negative pediatric ROS (+)  Hematology  (+) Blood dyscrasia, anemia ,   Anesthesia Other Findings Left breast cancer  Reproductive/Obstetrics negative OB ROS                            Anesthesia Physical Anesthesia Plan  ASA: 3  Anesthesia Plan: General   Post-op Pain Management: Minimal or no pain anticipated   Induction: Intravenous  PONV Risk Score and Plan: Propofol infusion  Airway Management Planned: Nasal Cannula and Natural Airway  Additional Equipment:   Intra-op Plan:   Post-operative Plan:   Informed Consent: I have reviewed the patients History and Physical, chart, labs and discussed the procedure including the risks, benefits and alternatives for the proposed anesthesia with the patient or authorized representative who has indicated his/her understanding and  acceptance.    Discussed DNR with patient, Discussed DNR with power of attorney and Suspend DNR.   Dental advisory given  Plan Discussed with: CRNA and Surgeon  Anesthesia Plan Comments:         Anesthesia Quick Evaluation

## 2022-01-18 NOTE — Transfer of Care (Signed)
Immediate Anesthesia Transfer of Care Note  Patient: Christine Sutton  Procedure(s) Performed: COLONOSCOPY WITH PROPOFOL ESOPHAGOGASTRODUODENOSCOPY (EGD) WITH PROPOFOL  Patient Location: PACU  Anesthesia Type:General  Level of Consciousness: awake, alert , oriented, drowsy and patient cooperative  Airway & Oxygen Therapy: Patient Spontanous Breathing and Patient connected to nasal cannula oxygen  Post-op Assessment: Report given to RN and Post -op Vital signs reviewed and stable  Post vital signs: Reviewed and stable  Last Vitals:  Vitals Value Taken Time  BP 119/59 01/18/22 1633  Temp 36.4 C 01/18/22 1633  Pulse 77 01/18/22 1633  Resp 17 01/18/22 1633  SpO2 99 % 01/18/22 1633  Vitals shown include unvalidated device data.  Last Pain:  Vitals:   01/18/22 1542  TempSrc:   PainSc: 0-No pain      Patients Stated Pain Goal: 7 (35/70/17 7939)  Complications: No notable events documented.

## 2022-01-18 NOTE — Discharge Summary (Signed)
Physician Discharge Summary  Christine Sutton FKC:[id number] DOB: [birth year] DOA: 01/15/2022  PCP: Asencion Noble, MD  Admit date: 01/15/2022 Discharge date: 01/18/2022  Admitted From: Home Disposition: Home  Recommendations for Outpatient Follow-up:  Follow up with PCP in 1-2 weeks Please obtain BMP/CBC in one week Follow-up with GI as needed  Discharge Condition: Stable CODE STATUS: DNR Diet recommendation: Heart healthy  Brief/Interim Summary: 88 year old female presents to the hospital with rectal bleeding.  Found to have significant anemia with a hemoglobin of 7.4.  Transfused 1 unit PRBC during her hospital stay.  GI following and she underwent EGD/colonoscopy.  Source of bleeding felt to be diverticular in origin  Discharge Diagnoses:  Principal Problem:   GI bleed Active Problems:   Diabetes mellitus due to underlying condition without complications (HCC)   Hypertension, essential, benign   Blood loss anemia   AKI (acute kidney injury) ([place])  GI bleeding -She was having bright red blood per rectum prior to admission -She has had a significant decline in hemoglobin from baseline -Currently on Protonix -GI following -She underwent EGD/colonoscopy that did not show any signs of active bleeding.  There was noted to have some clots in the sigmoid colon there was concern that she may be had a diverticular bleed. -It was felt that if she had any recurrent bleeding, could potentially undergo CTA and IR evaluation   Acute blood loss anemia Iron deficiency anemia -Baseline hemoglobin of 12.8 in 10/2021 -Admission hemoglobin noted to be 7.4 -Hemoglobin down to 7.0 during her stay -Transfuse 1 unit PRBC with improvement of hemoglobin -Started on oral iron at discharge   AKI -Baseline creatinine 0.7 -Admission creatinine 1.1 -Likely related to volume depletion from GI bleeding -Follow-up creatinine improved to 0.7    Hypertension -Resume home dose of antihypertensives since blood  pressure is trending up   Diabetes -Resume metformin on discharge -A1c 6.0  Discharge Instructions  Discharge Instructions     Diet - low sodium heart healthy   Complete by: As directed    Increase activity slowly   Complete by: As directed       Allergies as of 01/18/2022       Reactions   Codeine Nausea And Vomiting        Medication List     TAKE these medications    acetaminophen 500 MG tablet Commonly known as: TYLENOL Take 500 mg by mouth every 6 (six) hours as needed for moderate pain or headache.   amLODipine 5 MG tablet Commonly known as: NORVASC Take 5 mg by mouth at bedtime.   aspirin 81 MG chewable tablet Chew 81 mg by mouth at bedtime.   calcium carbonate 1500 (600 Ca) MG Tabs tablet Commonly known as: OSCAL Take 600 mg of elemental calcium by mouth daily.   CENTRUM SILVER PO Take 1 tablet by mouth daily.   diphenhydramine-acetaminophen 25-500 MG Tabs tablet Commonly known as: TYLENOL PM Take 1 tablet by mouth at bedtime.   ferrous gluconate 324 MG tablet Commonly known as: FERGON Take 1 tablet (324 mg total) by mouth daily with breakfast.   gabapentin 300 MG capsule Commonly known as: NEURONTIN Take 300 mg by mouth 3 (three) times daily as needed (pain).   lisinopril-hydrochlorothiazide 20-12.5 MG tablet Commonly known as: ZESTORETIC Take 2 tablets by mouth daily.   metFORMIN 500 MG 24 hr tablet Commonly known as: GLUCOPHAGE-XR Take 500 mg by mouth 2 (two) times daily.   nitrofurantoin 100 MG capsule Commonly known as: MACRODANTIN Take  100 mg by mouth daily.   polyethylene glycol 17 g packet Commonly known as: MIRALAX / GLYCOLAX Take 17 g by mouth at bedtime.   SYSTANE OP Place 1 drop into both eyes daily as needed (dry eyes).        Allergies  Allergen Reactions   Codeine Nausea And Vomiting    Consultations: Gastroenterology   Procedures/Studies: CT Angio Abd/Pel W and/or Wo Contrast  Result Date:  01/15/2022 CLINICAL DATA:  GI bleed, lower.  Bright red blood stools, weakness. EXAM: CTA ABDOMEN AND PELVIS WITHOUT AND WITH CONTRAST TECHNIQUE: Multidetector CT imaging of the abdomen and pelvis was performed using the standard protocol during bolus administration of intravenous contrast. Multiplanar reconstructed images and MIPs were obtained and reviewed to evaluate the vascular anatomy. RADIATION DOSE REDUCTION: This exam was performed according to the departmental dose-optimization program which includes automated exposure control, adjustment of the mA and/or kV according to patient size and/or use of iterative reconstruction technique. CONTRAST:  148m OMNIPAQUE IOHEXOL 350 MG/ML SOLN COMPARISON:  12/04/2020, 01/11/2022. FINDINGS: VASCULAR Aorta: Aortic atherosclerosis. Normal caliber aorta without aneurysm, dissection, vasculitis or significant stenosis. Celiac: Mild atherosclerotic calcification. Patent without evidence of aneurysm, dissection, vasculitis or significant stenosis. SMA: Mild atherosclerotic calcification. Patent without evidence of aneurysm, dissection, vasculitis or significant stenosis. Renals: Mild atherosclerotic calcification. Both renal arteries are patent without evidence of aneurysm, dissection, vasculitis, fibromuscular dysplasia or significant stenosis. IMA: Patent. Inflow: Mild atherosclerotic calcification. Patent without evidence of aneurysm, dissection, vasculitis or significant stenosis. Proximal Outflow: Mild atherosclerotic calcification. Bilateral common femoral and visualized portions of the superficial and profunda femoral arteries are patent without evidence of aneurysm, dissection, vasculitis or significant stenosis. Veins: No obvious venous abnormality within the limitations of this arterial phase study. Review of the MIP images confirms the above findings. NON-VASCULAR Lower chest: A few scattered coronary artery calcifications are noted. Atelectasis or scarring is  present at the lung bases. Hepatobiliary: Scattered hypodensities are present in the liver, the largest are cystic in attenuation. Hepatic steatosis is noted. The gallbladder is without stones. No biliary ductal dilatation. Pancreas: Cysts are present in the pancreatic body measuring 1.1 and 1.6 cm, slightly decreased from the prior exam. There is a cyst in the pancreatic tail measuring 9 mm, also decreased from the prior exam. No pancreatic ductal dilatation or surrounding inflammatory changes Spleen: Normal in size without focal abnormality. Adrenals/Urinary Tract: No adrenal nodule or mass. The kidneys enhance symmetrically. Hypodensities are noted in the kidneys bilaterally, previously characterized on cysts on recent MRI. No renal calculus or hydronephrosis. The bladder is unremarkable. Stomach/Bowel: There is a small hiatal hernia. No bowel obstruction, free air, or pneumatosis. Extensive colonic diverticulosis without diverticulitis. There is a moderate amount of retained stool in the colon. The appendix is not visualized on exam. No acute hemorrhage or active contrast extravasation is seen. Lymphatic: No abdominal or pelvic lymphadenopathy by size criteria. Reproductive: Status post hysterectomy. No adnexal masses. Other: No abdominopelvic ascites. Fat containing periumbilical hernia. Musculoskeletal: Dextroscoliosis and degenerative changes in the thoracolumbar spine. Lumbar spinal fusion hardware is noted at L4-L5. IMPRESSION: VASCULAR 1. No evidence contrast extravasation or active hemorrhage. 2. Aortic atherosclerosis without evidence of aneurysm or dissection. NON-VASCULAR 1. Extensive colonic diverticulosis without diverticulitis. 2. Moderate amount of retained stool in the colon suggesting constipation. 3. Hepatic cysts and steatosis. 4. Renal cysts. 5. Pancreatic cysts, slightly decreased in size from the prior exam. Electronically Signed   By: LBrett FairyM.D.   On: 01/15/2022 21:57  MR Abdomen  W Wo Contrast  Result Date: 01/12/2022 CLINICAL DATA:  Follow-up pancreatic cyst. EXAM: MRI ABDOMEN WITHOUT AND WITH CONTRAST TECHNIQUE: Multiplanar multisequence MR imaging of the abdomen was performed both before and after the administration of intravenous contrast. CONTRAST:  63m GADAVIST GADOBUTROL 1 MMOL/ML IV SOLN COMPARISON:  01/14/2021 FINDINGS: Lower chest: Trace bilateral pleural effusions. Hepatobiliary: Multiple liver cysts are identified and appear unchanged from the previous exam. The largest is in the posterior right lobe measuring 4.3 x 2.9 cm. There are no suspicious enhancing liver lesions. The gallbladder appears normal. Increase caliber of the common bile duct measures 1 cm. Unchanged from previous exam. No signs of choledocholithiasis or mass. Pancreas:  There is no main duct dilatation or inflammation. Multiple cysts are identified scattered throughout the head, neck, body and tail of the pancreas. The largest cyst is located in the pancreatic body measuring 1.8 cm, image 18/6. This is unchanged compared with the prior exam. Cyst within the tail of pancreas measures 1.4 cm, image 19/6. Previously this measured 1.9 cm. Cyst within the head of pancreas measures 1 cm, image 18/6. Previously 1.1 cm. Spleen:  Within normal limits in size and appearance. Adrenals/Urinary Tract: Normal adrenal glands. Bilateral simple appearing kidney cysts are again noted and appear unchanged. The largest is in the interpolar right kidney measuring 1.4 cm, image 20/6. No follow-up imaging recommended. No hydronephrosis identified bilaterally. Stomach/Bowel: Stomach appears normal. No dilated loops of bowel. Multiple colonic diverticula identified. Vascular/Lymphatic: No pathologically enlarged lymph nodes identified. No abdominal aortic aneurysm demonstrated. Other:  No ascites or focal fluid collections. Musculoskeletal: Thoracolumbar scoliosis deformity identified. No suspicious bone lesions. IMPRESSION: 1. Stable  appearance of multifocal cystic lesions within the pancreas. The largest measures 1.8 cm. According to consensus criteria follow-up imaging in 24 months with pancreas protocol MRI is advised. This recommendation follows ACR consensus guidelines: Management of Incidental Pancreatic Cysts: A White Paper of the ACR Incidental Findings Committee. [id number];54:098-119 2. Trace bilateral pleural effusions. Electronically Signed   By: TKerby MoorsM.D.   On: 01/12/2022 07:19   MR 3D Recon At Scanner  Result Date: 01/12/2022 CLINICAL DATA:  Follow-up pancreatic cyst. EXAM: MRI ABDOMEN WITHOUT AND WITH CONTRAST TECHNIQUE: Multiplanar multisequence MR imaging of the abdomen was performed both before and after the administration of intravenous contrast. CONTRAST:  778mGADAVIST GADOBUTROL 1 MMOL/ML IV SOLN COMPARISON:  01/14/2021 FINDINGS: Lower chest: Trace bilateral pleural effusions. Hepatobiliary: Multiple liver cysts are identified and appear unchanged from the previous exam. The largest is in the posterior right lobe measuring 4.3 x 2.9 cm. There are no suspicious enhancing liver lesions. The gallbladder appears normal. Increase caliber of the common bile duct measures 1 cm. Unchanged from previous exam. No signs of choledocholithiasis or mass. Pancreas:  There is no main duct dilatation or inflammation. Multiple cysts are identified scattered throughout the head, neck, body and tail of the pancreas. The largest cyst is located in the pancreatic body measuring 1.8 cm, image 18/6. This is unchanged compared with the prior exam. Cyst within the tail of pancreas measures 1.4 cm, image 19/6. Previously this measured 1.9 cm. Cyst within the head of pancreas measures 1 cm, image 18/6. Previously 1.1 cm. Spleen:  Within normal limits in size and appearance. Adrenals/Urinary Tract: Normal adrenal glands. Bilateral simple appearing kidney cysts are again noted and appear unchanged. The largest is in the  interpolar right kidney measuring 1.4 cm, image 20/6. No follow-up imaging recommended. No hydronephrosis  identified bilaterally. Stomach/Bowel: Stomach appears normal. No dilated loops of bowel. Multiple colonic diverticula identified. Vascular/Lymphatic: No pathologically enlarged lymph nodes identified. No abdominal aortic aneurysm demonstrated. Other:  No ascites or focal fluid collections. Musculoskeletal: Thoracolumbar scoliosis deformity identified. No suspicious bone lesions. IMPRESSION: 1. Stable appearance of multifocal cystic lesions within the pancreas. The largest measures 1.8 cm. According to consensus criteria follow-up imaging in 24 months with pancreas protocol MRI is advised. This recommendation follows ACR consensus guidelines: Management of Incidental Pancreatic Cysts: A White Paper of the ACR Incidental Findings Committee. [place] 9562;13:086-578. 2. Trace bilateral pleural effusions. Electronically Signed   By: Kerby Moors M.D.   On: 01/12/2022 07:19      Subjective: She feels well.  Denies any complaints.  Eager to discharge home  Discharge Exam: Vitals:   01/18/22 0438 01/18/22 1356 01/18/22 1419 01/18/22 1633  BP: 137/68 (!) 161/71 (!) 165/72 (!) 119/59  Pulse: 95 (!) 101  79  Resp: '18 18 16   '$ Temp: 97.8 F (36.6 C) 97.7 F (36.5 C) 97.6 F (36.4 C) 97.6 F (36.4 C)  TempSrc: Oral Oral Oral   SpO2: 100% 100% 99% 98%  Weight: 62.2 kg     Height: '5\' 5"'$  (1.651 m)       General: Pt is alert, awake, not in acute distress Cardiovascular: RRR, S1/S2 +, no rubs, no gallops Respiratory: CTA bilaterally, no wheezing, no rhonchi Abdominal: Soft, NT, ND, bowel sounds + Extremities: no edema, no cyanosis    The results of significant diagnostics from this hospitalization (including imaging, microbiology, ancillary and laboratory) are listed below for reference.     Microbiology: No results found for this or any previous visit (from the past 240 hour(s)).    Labs: BNP (last 3 results) No results for input(s): "BNP" in the last 8760 hours. Basic Metabolic Panel: Recent Labs  Lab 01/15/22 1911 01/16/22 0327 01/18/22 0417  NA 133* 135 140  K 3.7 3.9 3.9  CL 99 103 110  CO2 '26 25 24  '$ GLUCOSE 217* 156* 112*  BUN 20 17 6*  CREATININE 1.13* 0.75 0.58  CALCIUM 9.5 9.4 8.9  MG  --  1.8  --    Liver Function Tests: Recent Labs  Lab 01/15/22 1911 01/16/22 0327  AST 22 22  ALT 25 25  ALKPHOS 58 56  BILITOT 0.2* 0.4  PROT 6.5 6.7  ALBUMIN 3.8 3.8   No results for input(s): "LIPASE", "AMYLASE" in the last 168 hours. No results for input(s): "AMMONIA" in the last 168 hours. CBC: Recent Labs  Lab 01/14/22 0823 01/15/22 1911 01/16/22 0325 01/16/22 0814 01/16/22 1613 01/17/22 0755 01/18/22 0417  WBC 6.8   < > 7.5 8.5 9.9 6.2 9.1  NEUTROABS 4.4  --   --   --   --   --   --   HGB 7.4*   < > 7.6* 7.2* 7.0* 8.5* 9.1*  HCT 22.2*   < > 23.9* 22.3* 22.1* 26.4* 27.8*  MCV 86   < > 89.8 88.8 89.1 87.1 87.4  PLT 425   < > 465* 429* 419* 447* 472*   < > = values in this interval not displayed.   Cardiac Enzymes: No results for input(s): "CKTOTAL", "CKMB", "CKMBINDEX", "TROPONINI" in the last 168 hours. BNP: Invalid input(s): "POCBNP" CBG: Recent Labs  Lab 01/17/22 1614 01/17/22 2125 01/18/22 0708 01/18/22 1112 01/18/22 1426  GLUCAP 96 137* 128* 114* 120*   D-Dimer No results for input(s): "  DDIMER" in the last 72 hours. Hgb A1c Recent Labs    01/16/22 0326  HGBA1C 6.0*   Lipid Profile No results for input(s): "CHOL", "HDL", "LDLCALC", "TRIG", "CHOLHDL", "LDLDIRECT" in the last 72 hours. Thyroid function studies No results for input(s): "TSH", "T4TOTAL", "T3FREE", "THYROIDAB" in the last 72 hours.  Invalid input(s): "FREET3" Anemia work up Recent Labs    01/17/22 0851  FERRITIN 10*  TIBC 399  IRON 19*   Urinalysis No results found for: "COLORURINE", "APPEARANCEUR", "LABSPEC", "PHURINE", "GLUCOSEU", "HGBUR",  "BILIRUBINUR", "KETONESUR", "PROTEINUR", "UROBILINOGEN", "NITRITE", "LEUKOCYTESUR" Sepsis Labs Recent Labs  Lab 01/16/22 0814 01/16/22 1613 01/17/22 0755 01/18/22 0417  WBC 8.5 9.9 6.2 9.1   Microbiology No results found for this or any previous visit (from the past 240 hour(s)).   Time coordinating discharge: 86mns  SIGNED:   JKathie Dike MD  [facility] 01/18/2022, 9:38 PM   If 7PM-7AM, please contact night-coverage [email or web address]

## 2022-01-18 NOTE — Progress Notes (Signed)
Returned from endo and ate supper.  Dressed self and niece at bedside.  IV removed and to start ferrous gluconate tabs at home.  Discharge instructions reviewed. Transported by WC to entrance.  Son to drive home

## 2022-01-18 NOTE — Anesthesia Postprocedure Evaluation (Signed)
Anesthesia Post Note  Patient: Christine Sutton  Procedure(s) Performed: COLONOSCOPY WITH PROPOFOL ESOPHAGOGASTRODUODENOSCOPY (EGD) WITH PROPOFOL  Patient location during evaluation: PACU Anesthesia Type: General Level of consciousness: awake and alert and oriented Pain management: pain level controlled Vital Signs Assessment: post-procedure vital signs reviewed and stable Respiratory status: spontaneous breathing, nonlabored ventilation and respiratory function stable Cardiovascular status: blood pressure returned to baseline and stable Postop Assessment: no apparent nausea or vomiting Anesthetic complications: no   No notable events documented.   Last Vitals:  Vitals:   01/18/22 1419 01/18/22 1633  BP: (!) 165/72 (!) 119/59  Pulse:  79  Resp: 16   Temp: 36.4 C 36.4 C  SpO2: 99% 98%    Last Pain:  Vitals:   01/18/22 1633  TempSrc:   PainSc: 0-No pain                 [name]

## 2022-01-18 NOTE — Op Note (Addendum)
[facility] Patient Name: Christine Sutton Procedure Date: 01/18/2022 3:35 PM MRN: [id number] Date of Birth: 24-Jun-1929 Attending MD: Maylon Peppers ,  CSN: [id number] Age: 88 Admit Type: Inpatient Procedure:                Colonoscopy Indications:              Hematochezia Providers:                Maylon Peppers, Oakwood Page Referring MD:              Medicines:                Monitored Anesthesia Care Complications:            No immediate complications. Estimated Blood Loss:     Estimated blood loss: none. Procedure:                Pre-Anesthesia Assessment:                           - Prior to the procedure, a History and Physical                            was performed, and patient medications, allergies                            and sensitivities were reviewed. The patient's                            tolerance of previous anesthesia was reviewed.                           - The risks and benefits of the procedure and the                            sedation options and risks were discussed with the                            patient. All questions were answered and informed                            consent was obtained.                           - ASA Grade Assessment: II - A patient with mild                            systemic disease.                           After obtaining informed consent, the colonoscope                            was passed under direct vision. Throughout the                            procedure, the patient's blood pressure, pulse, and  oxygen saturations were monitored continuously. The                            [id number]-HQ190L ([id number]) scope was introduced through                            the anus and advanced to the the cecum, identified                            by appendiceal orifice and ileocecal valve. The                            colonoscopy was performed without difficulty. The                             patient tolerated the procedure well. The quality                            of the bowel preparation was good. Scope In: 3:56:57 PM Scope Out: 4:25:43 PM Total Procedure Duration: 0 hours 28 minutes 46 seconds  Findings:      The perianal and digital rectal examinations were normal.      Multiple small and large-mouthed diverticula were found in the entire       colon, worse in the left colon. There was no evidence of active       diverticular bleeding. However, there was presence some floating clots       in the sigmoid area, without any vessels adjacent to this.      The retroflexed view of the distal rectum and anal verge was normal and       showed no anal or rectal abnormalities.      NOTE: Bleeding related related to self-limited diverticular bleeding Impression:               - Diverticulosis in the entire examined colon.                            There was no evidence of diverticular bleeding.                           - The distal rectum and anal verge are normal on                            retroflexion view.                           - No specimens collected. Moderate Sedation:      Per Anesthesia Care Recommendation:           - Return patient to hospital ward for ongoing care.                           - High fiber diet.                           - Repeat colonoscopy is not recommended due to  current age (54 years or older) for screening                            purposes.                           - H/H daily                           - May be discharged home tomorrow a.m. if                            tolerating diet and hemoglobin remains stable                           - If she bleeds clinically, proceed with STAT CT                            angio abdomen and pelvis with IV contrast and reach                            IR with results if abnormal. Procedure Code(s):        --- Professional ---                            [phone], Colonoscopy, flexible; diagnostic, including                            collection of specimen(s) by brushing or washing,                            when performed (separate procedure) Diagnosis Code(s):        --- Professional ---                           K92.1, Melena (includes Hematochezia)                           K57.30, Diverticulosis of large intestine without                            perforation or abscess without bleeding CPT copyright 2019 American Medical Association. All rights reserved. The codes documented in this report are preliminary and upon coder review may  be revised to meet current compliance requirements. Maylon Peppers, MD Maylon Peppers,  01/18/2022 4:38:29 PM This report has been signed electronically. Number of Addenda: 0

## 2022-01-18 NOTE — Brief Op Note (Addendum)
01/15/2022 - 01/18/2022  3:55 PM  PATIENT:  Christine Sutton  88 y.o. female  PRE-OPERATIVE DIAGNOSIS:  Symptomatic anemia, bright red blood per rectum, melena, constipation  POST-OPERATIVE DIAGNOSIS:  EGD: duodenal diverticulum  PROCEDURE:  Procedure(s): COLONOSCOPY WITH PROPOFOL (N/A) ESOPHAGOGASTRODUODENOSCOPY (EGD) WITH PROPOFOL (N/A)  SURGEON:  Surgeon(s) and Role:    * Harvel Quale, MD - Primary  Patient underwent EGD and colonoscopy under propofol sedation.  Tolerated the procedure adequately.  Esophagus showed 1 cm hiatal hernia. Normal stomach. There was a diverticulum in the 2nd portion of the duodenum, rest of the duodenum was normal up to the 3rd portion of the duodenum.  Colonoscopy showed presence of extensive diverticulosis, worse on the left side of the colon.  There was presence of few floating clots in the sigmoid area but no active bleeding or other stigmata of recent bleeding.  Normal retroflexion.  NOTE: Bleeding related related to self-limited diverticular bleeding  RECOMMENDATIONS - Return to hospital ward for ongoing care - Advance diet to high fiber diet - H/H daily - May be discharged home tomorrow a.m. if tolerating diet and hemoglobin remains stable -No repeat screening colonoscopy is recommended given patient's age - If she rebleeds, proceed with STAT CT angio abdomen and pelvis with IV contrast and reach IR with results if abnormal.   Maylon Peppers, MD Gastroenterology and Hepatology [facility]

## 2022-01-18 NOTE — Op Note (Signed)
[facility] Patient Name: Christine Sutton Procedure Date: 01/18/2022 3:34 PM MRN: [id number] Date of Birth: Jun 01, 1930 Attending MD: Maylon Peppers ,  CSN: [id number] Age: 88 Admit Type: Inpatient Procedure:                Upper GI endoscopy Indications:              Hematochezia Providers:                Maylon Peppers, Pepin Page Referring MD:              Medicines:                Monitored Anesthesia Care Complications:            No immediate complications. Estimated Blood Loss:     Estimated blood loss: none. Procedure:                Pre-Anesthesia Assessment:                           - Prior to the procedure, a History and Physical                            was performed, and patient medications, allergies                            and sensitivities were reviewed. The patient's                            tolerance of previous anesthesia was reviewed.                           - The risks and benefits of the procedure and the                            sedation options and risks were discussed with the                            patient. All questions were answered and informed                            consent was obtained.                           - ASA Grade Assessment: II - A patient with mild                            systemic disease.                           After obtaining informed consent, the endoscope was                            passed under direct vision. Throughout the                            procedure, the patient's blood pressure, pulse, and  oxygen saturations were monitored continuously. The                            [id number]-[id number] ([id number]) scope was introduced through the                            mouth, and advanced to the third part of duodenum.                            The upper GI endoscopy was accomplished without                            difficulty. The patient tolerated the procedure                             well. Scope In: 3:48:41 PM Scope Out: 3:52:14 PM Total Procedure Duration: 0 hours 3 minutes 33 seconds  Findings:      A 1 cm hiatal hernia was present.      The gastroesophageal flap valve was visualized endoscopically and       classified as Hill Grade III (minimal fold, loose to endoscope, hiatal       hernia likely).      The stomach was normal.      A medium non-bleeding diverticulum was found in the second portion of       the duodenum.      The exam of the duodenum was otherwise normal. Impression:               - 1 cm hiatal hernia.                           - Gastroesophageal flap valve classified as Hill                            Grade III (minimal fold, loose to endoscope, hiatal                            hernia likely).                           - Normal stomach.                           - Non-bleeding duodenal diverticulum.                           - No specimens collected. Moderate Sedation:      Per Anesthesia Care Recommendation:           - Return patient to hospital ward for ongoing care.                           - Resume previous diet. Procedure Code(s):        --- Professional ---                           [phone], Esophagogastroduodenoscopy, flexible,  transoral; diagnostic, including collection of                            specimen(s) by brushing or washing, when performed                            (separate procedure) Diagnosis Code(s):        --- Professional ---                           K44.9, Diaphragmatic hernia without obstruction or                            gangrene                           K92.1, Melena (includes Hematochezia)                           K57.10, Diverticulosis of small intestine without                            perforation or abscess without bleeding CPT copyright 2019 American Medical Association. All rights reserved. The codes documented in this report are preliminary and upon  coder review may  be revised to meet current compliance requirements. Maylon Peppers, MD Maylon Peppers,  01/18/2022 4:30:16 PM This report has been signed electronically. Number of Addenda: 0

## 2022-01-19 LAB — TYPE AND SCREEN
ABO/RH(D): B POS
Antibody Screen: POSITIVE
Donor AG Type: NEGATIVE
Donor AG Type: NEGATIVE
Unit division: 0
Unit division: 0

## 2022-01-19 LAB — BPAM RBC
Blood Product Expiration Date: 202309052359
Blood Product Expiration Date: 202309052359
ISSUE DATE / TIME: 202308062200
ISSUE DATE / TIME: 202308062200
Unit Type and Rh: 7300
Unit Type and Rh: 7300

## 2022-01-21 ENCOUNTER — Encounter (HOSPITAL_COMMUNITY): Payer: Self-pay

## 2022-01-21 ENCOUNTER — Ambulatory Visit (HOSPITAL_COMMUNITY): Admit: 2022-01-21 | Payer: Medicare Other

## 2022-01-21 SURGERY — COLONOSCOPY WITH PROPOFOL
Anesthesia: Monitor Anesthesia Care

## 2022-01-24 ENCOUNTER — Encounter (HOSPITAL_COMMUNITY): Payer: Self-pay | Admitting: Gastroenterology

## 2022-02-03 DIAGNOSIS — Z961 Presence of intraocular lens: Secondary | ICD-10-CM | POA: Diagnosis not present

## 2022-02-03 DIAGNOSIS — Z7984 Long term (current) use of oral hypoglycemic drugs: Secondary | ICD-10-CM | POA: Diagnosis not present

## 2022-02-03 DIAGNOSIS — E119 Type 2 diabetes mellitus without complications: Secondary | ICD-10-CM | POA: Diagnosis not present

## 2022-02-15 DIAGNOSIS — E785 Hyperlipidemia, unspecified: Secondary | ICD-10-CM | POA: Diagnosis not present

## 2022-02-15 DIAGNOSIS — I10 Essential (primary) hypertension: Secondary | ICD-10-CM | POA: Diagnosis not present

## 2022-02-15 DIAGNOSIS — E1129 Type 2 diabetes mellitus with other diabetic kidney complication: Secondary | ICD-10-CM | POA: Diagnosis not present

## 2022-02-15 DIAGNOSIS — T466X5A Adverse effect of antihyperlipidemic and antiarteriosclerotic drugs, initial encounter: Secondary | ICD-10-CM | POA: Diagnosis not present

## 2022-02-15 DIAGNOSIS — Z853 Personal history of malignant neoplasm of breast: Secondary | ICD-10-CM | POA: Diagnosis not present

## 2022-02-15 DIAGNOSIS — Z79899 Other long term (current) drug therapy: Secondary | ICD-10-CM | POA: Diagnosis not present

## 2022-02-22 DIAGNOSIS — Z23 Encounter for immunization: Secondary | ICD-10-CM | POA: Diagnosis not present

## 2022-02-22 DIAGNOSIS — R7309 Other abnormal glucose: Secondary | ICD-10-CM | POA: Diagnosis not present

## 2022-02-22 DIAGNOSIS — D649 Anemia, unspecified: Secondary | ICD-10-CM | POA: Diagnosis not present

## 2022-02-22 DIAGNOSIS — I700 Atherosclerosis of aorta: Secondary | ICD-10-CM | POA: Diagnosis not present

## 2022-02-22 DIAGNOSIS — E1122 Type 2 diabetes mellitus with diabetic chronic kidney disease: Secondary | ICD-10-CM | POA: Diagnosis not present

## 2022-02-22 DIAGNOSIS — Z6823 Body mass index (BMI) 23.0-23.9, adult: Secondary | ICD-10-CM | POA: Diagnosis not present

## 2022-02-22 DIAGNOSIS — K922 Gastrointestinal hemorrhage, unspecified: Secondary | ICD-10-CM | POA: Diagnosis not present

## 2022-02-22 DIAGNOSIS — I10 Essential (primary) hypertension: Secondary | ICD-10-CM | POA: Diagnosis not present

## 2022-02-22 DIAGNOSIS — Z853 Personal history of malignant neoplasm of breast: Secondary | ICD-10-CM | POA: Diagnosis not present

## 2022-03-14 ENCOUNTER — Telehealth: Payer: Self-pay | Admitting: Internal Medicine

## 2022-03-14 NOTE — Telephone Encounter (Signed)
Pt called asking to reschedule her colonoscopy that was scheduled with Dr Abbey Chatters on 01/21/2022. Does she need another OV prior to scheduling? [phone]

## 2022-03-14 NOTE — Telephone Encounter (Signed)
Looks like she had procedure done while hospitalized on 8/8.

## 2022-05-16 IMAGING — MG MM BREAST LOCALIZATION CLIP
6 of 10 series · 6 of 30 positions shown · non-contrast
Comparison: Previous exam(s).

CLINICAL DATA: Patient status post ultrasound-guided biopsy right
breast mass 11 o'clock position and right breast mass 12 o'clock
position.

EXAM:
DIAGNOSTIC RIGHT MAMMOGRAM POST ULTRASOUND BIOPSY

[R ML synth-2D]
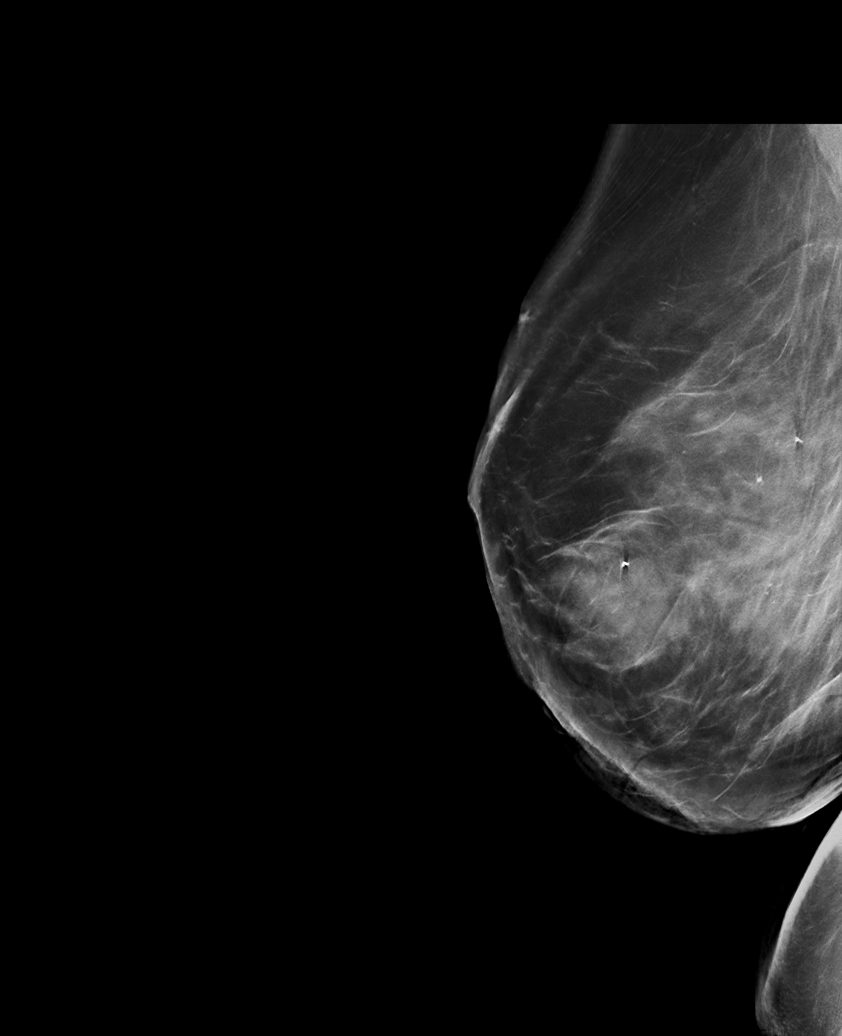

[R CC synth-2D]
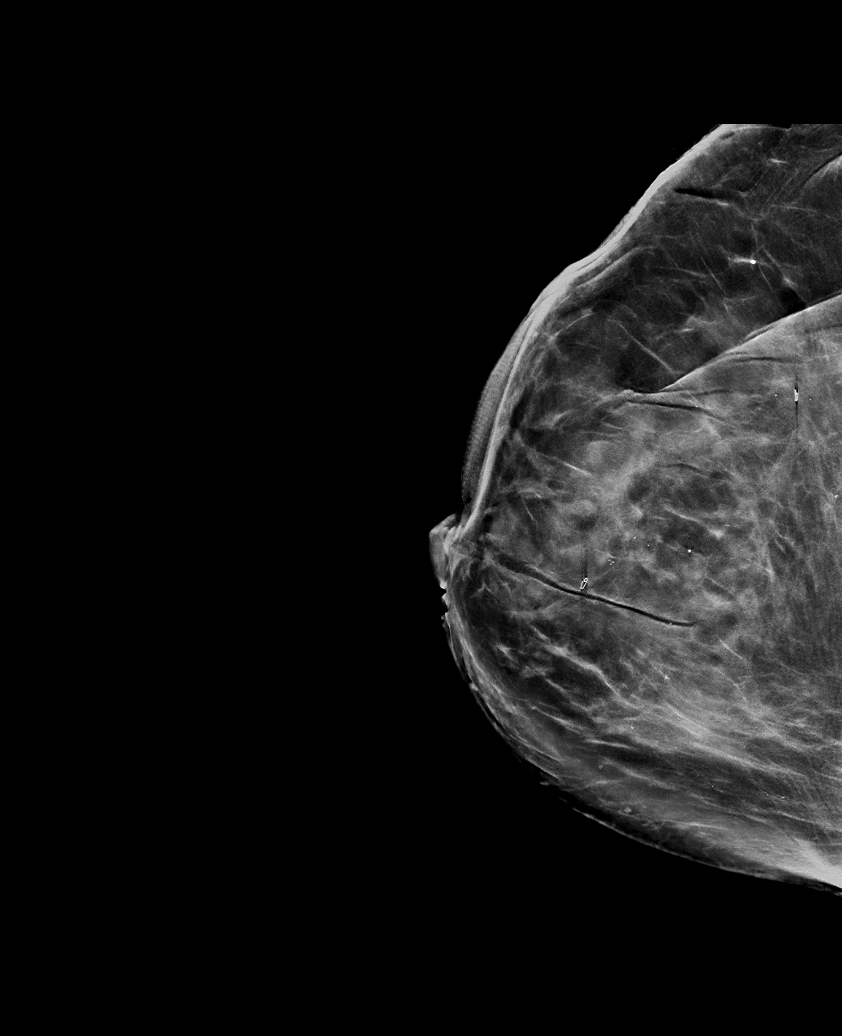

[R CC]
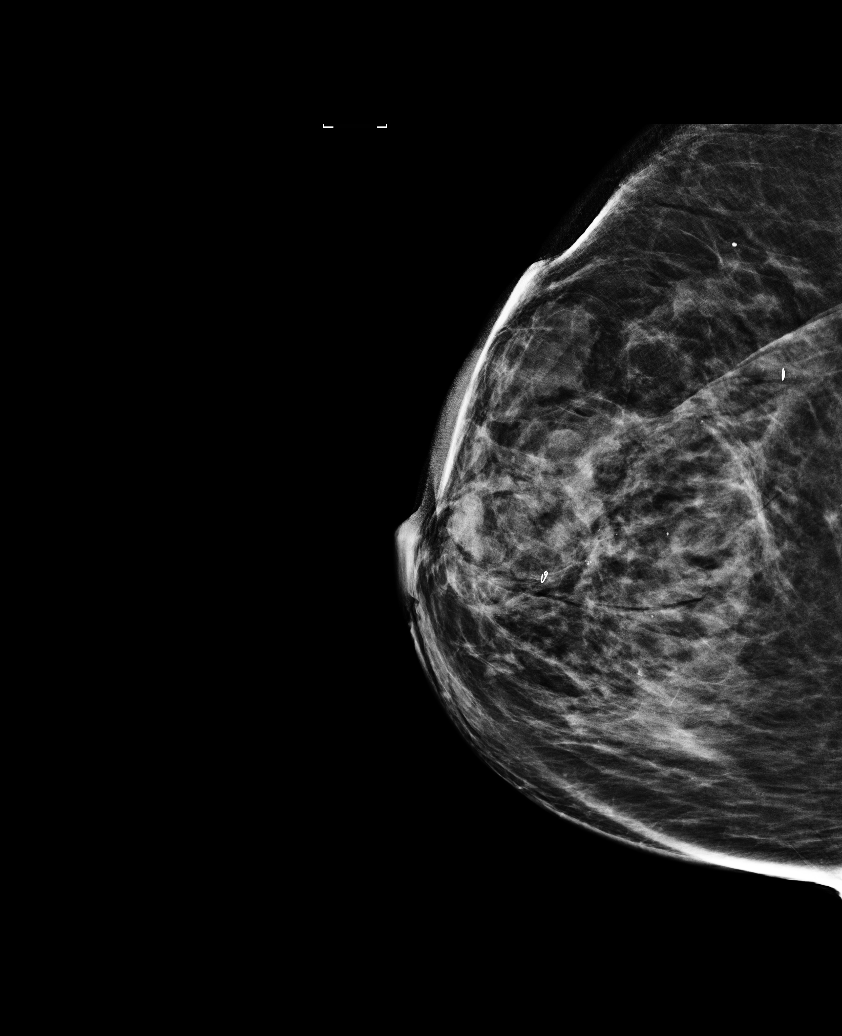

[R ML (1 of 2)]
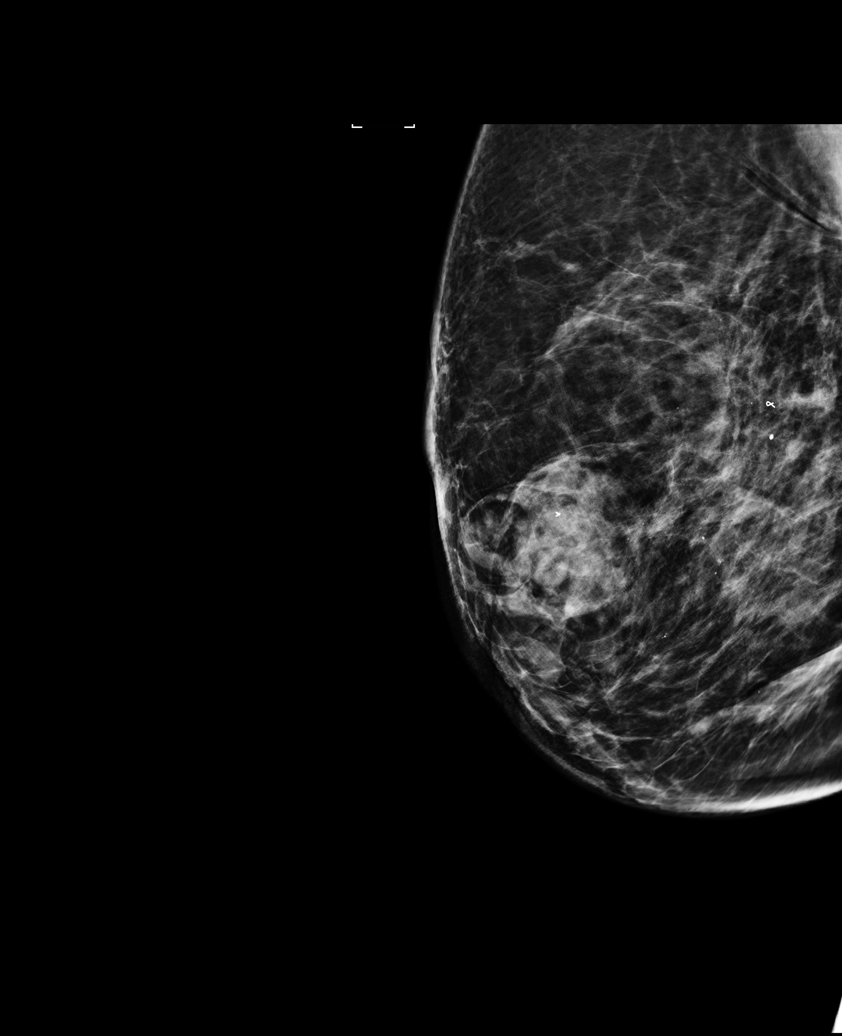

[R ML (2 of 2)]
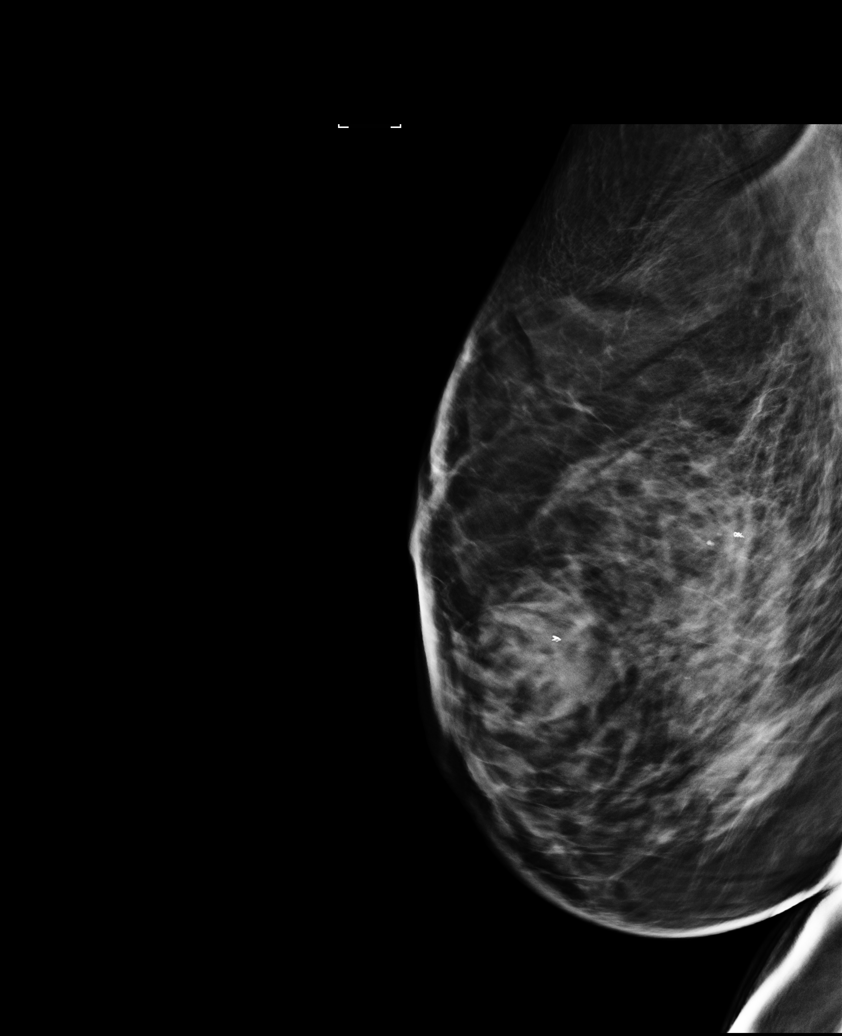

[R CC tomo · tomo slice 48/95.0]
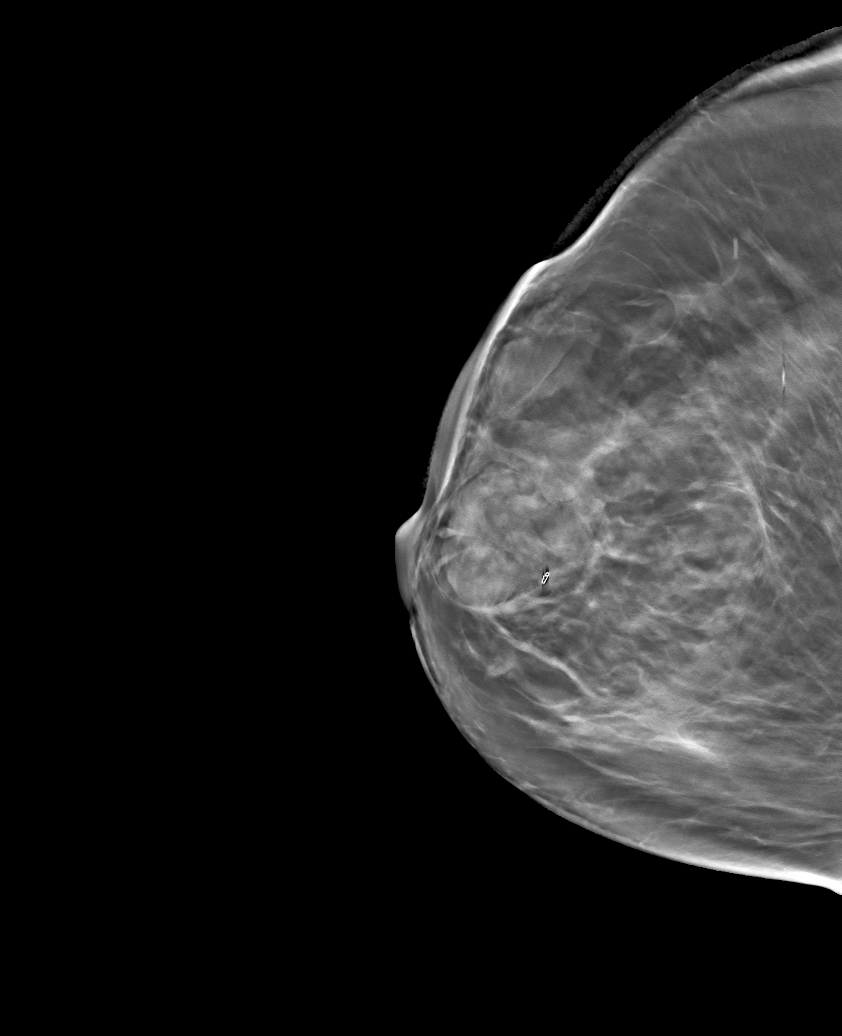

[6 of 30 positions shown; findings below may reference images not displayed]

FINDINGS: Site 1: Right breast mass 11 o'clock position: Ribbon shaped marking
clip: In appropriate position.

Site 2: Right breast mass 12 o'clock position: Wing shaped clip: In
appropriate position.
IMPRESSION: Appropriate position of the biopsy marking clips as above.

Final Assessment: Post Procedure Mammograms for Marker Placement

## 2022-06-17 DIAGNOSIS — E785 Hyperlipidemia, unspecified: Secondary | ICD-10-CM | POA: Diagnosis not present

## 2022-06-17 DIAGNOSIS — K922 Gastrointestinal hemorrhage, unspecified: Secondary | ICD-10-CM | POA: Diagnosis not present

## 2022-06-17 DIAGNOSIS — D509 Iron deficiency anemia, unspecified: Secondary | ICD-10-CM | POA: Diagnosis not present

## 2022-06-17 DIAGNOSIS — E1129 Type 2 diabetes mellitus with other diabetic kidney complication: Secondary | ICD-10-CM | POA: Diagnosis not present

## 2022-06-17 DIAGNOSIS — Z79899 Other long term (current) drug therapy: Secondary | ICD-10-CM | POA: Diagnosis not present

## 2022-06-24 DIAGNOSIS — D509 Iron deficiency anemia, unspecified: Secondary | ICD-10-CM | POA: Diagnosis not present

## 2022-06-24 DIAGNOSIS — E1122 Type 2 diabetes mellitus with diabetic chronic kidney disease: Secondary | ICD-10-CM | POA: Diagnosis not present

## 2022-06-24 DIAGNOSIS — I10 Essential (primary) hypertension: Secondary | ICD-10-CM | POA: Diagnosis not present

## 2022-07-12 DIAGNOSIS — I10 Essential (primary) hypertension: Secondary | ICD-10-CM | POA: Diagnosis not present

## 2022-07-12 DIAGNOSIS — E119 Type 2 diabetes mellitus without complications: Secondary | ICD-10-CM | POA: Diagnosis not present

## 2022-07-12 DIAGNOSIS — E785 Hyperlipidemia, unspecified: Secondary | ICD-10-CM | POA: Diagnosis not present

## 2022-07-25 ENCOUNTER — Other Ambulatory Visit: Payer: Self-pay

## 2022-07-25 DIAGNOSIS — C50911 Malignant neoplasm of unspecified site of right female breast: Secondary | ICD-10-CM

## 2022-07-25 DIAGNOSIS — C50912 Malignant neoplasm of unspecified site of left female breast: Secondary | ICD-10-CM

## 2022-07-25 DIAGNOSIS — E559 Vitamin D deficiency, unspecified: Secondary | ICD-10-CM

## 2022-07-25 DIAGNOSIS — R978 Other abnormal tumor markers: Secondary | ICD-10-CM

## 2022-07-26 ENCOUNTER — Inpatient Hospital Stay: Payer: Medicare Other | Attending: Hematology

## 2022-07-26 DIAGNOSIS — I10 Essential (primary) hypertension: Secondary | ICD-10-CM | POA: Insufficient documentation

## 2022-07-26 DIAGNOSIS — Z853 Personal history of malignant neoplasm of breast: Secondary | ICD-10-CM | POA: Diagnosis not present

## 2022-07-26 DIAGNOSIS — E119 Type 2 diabetes mellitus without complications: Secondary | ICD-10-CM | POA: Diagnosis not present

## 2022-07-26 DIAGNOSIS — K862 Cyst of pancreas: Secondary | ICD-10-CM | POA: Diagnosis not present

## 2022-07-26 DIAGNOSIS — Z801 Family history of malignant neoplasm of trachea, bronchus and lung: Secondary | ICD-10-CM | POA: Diagnosis not present

## 2022-07-26 DIAGNOSIS — Z803 Family history of malignant neoplasm of breast: Secondary | ICD-10-CM | POA: Insufficient documentation

## 2022-07-26 DIAGNOSIS — Z800 Family history of malignant neoplasm of digestive organs: Secondary | ICD-10-CM | POA: Insufficient documentation

## 2022-07-26 DIAGNOSIS — E559 Vitamin D deficiency, unspecified: Secondary | ICD-10-CM | POA: Insufficient documentation

## 2022-07-26 DIAGNOSIS — R978 Other abnormal tumor markers: Secondary | ICD-10-CM

## 2022-07-26 DIAGNOSIS — C50911 Malignant neoplasm of unspecified site of right female breast: Secondary | ICD-10-CM

## 2022-07-26 DIAGNOSIS — Z9011 Acquired absence of right breast and nipple: Secondary | ICD-10-CM | POA: Insufficient documentation

## 2022-07-26 LAB — CBC WITH DIFFERENTIAL/PLATELET
Abs Immature Granulocytes: 0.03 10*3/uL (ref 0.00–0.07)
Basophils Absolute: 0.1 10*3/uL (ref 0.0–0.1)
Basophils Relative: 1 %
Eosinophils Absolute: 0.6 10*3/uL — ABNORMAL HIGH (ref 0.0–0.5)
Eosinophils Relative: 8 %
HCT: 42 % (ref 36.0–46.0)
Hemoglobin: 13.7 g/dL (ref 12.0–15.0)
Immature Granulocytes: 0 %
Lymphocytes Relative: 23 %
Lymphs Abs: 1.7 10*3/uL (ref 0.7–4.0)
MCH: 29.1 pg (ref 26.0–34.0)
MCHC: 32.6 g/dL (ref 30.0–36.0)
MCV: 89.4 fL (ref 80.0–100.0)
Monocytes Absolute: 0.5 10*3/uL (ref 0.1–1.0)
Monocytes Relative: 7 %
Neutro Abs: 4.8 10*3/uL (ref 1.7–7.7)
Neutrophils Relative %: 61 %
Platelets: 179 10*3/uL (ref 150–400)
RBC: 4.7 MIL/uL (ref 3.87–5.11)
RDW: 13.2 % (ref 11.5–15.5)
WBC: 7.7 10*3/uL (ref 4.0–10.5)
nRBC: 0 % (ref 0.0–0.2)

## 2022-07-26 LAB — COMPREHENSIVE METABOLIC PANEL
ALT: 18 U/L (ref 0–44)
AST: 20 U/L (ref 15–41)
Albumin: 4.3 g/dL (ref 3.5–5.0)
Alkaline Phosphatase: 84 U/L (ref 38–126)
Anion gap: 11 (ref 5–15)
BUN: 18 mg/dL (ref 8–23)
CO2: 26 mmol/L (ref 22–32)
Calcium: 10.4 mg/dL — ABNORMAL HIGH (ref 8.9–10.3)
Chloride: 96 mmol/L — ABNORMAL LOW (ref 98–111)
Creatinine, Ser: 0.71 mg/dL (ref 0.44–1.00)
GFR, Estimated: >60 mL/min (ref >60)
Glucose, Bld: 109 mg/dL — ABNORMAL HIGH (ref 70–99)
Potassium: 3.9 mmol/L (ref 3.5–5.1)
Sodium: 133 mmol/L — ABNORMAL LOW (ref 135–145)
Total Bilirubin: 0.4 mg/dL (ref 0.3–1.2)
Total Protein: 7.2 g/dL (ref 6.5–8.1)

## 2022-07-27 LAB — MISC LABCORP TEST (SEND OUT): Labcorp test code: 81950

## 2022-07-27 LAB — CANCER ANTIGEN 15-3: CA 15-3: 34.6 U/mL — ABNORMAL HIGH (ref 0.0–25.0)

## 2022-08-03 ENCOUNTER — Inpatient Hospital Stay (HOSPITAL_BASED_OUTPATIENT_CLINIC_OR_DEPARTMENT_OTHER): Payer: Medicare Other | Admitting: Hematology

## 2022-08-03 VITALS — BP 137/77 | HR 80 | Temp 97.3°F | Resp 16 | Wt 143.0 lb

## 2022-08-03 DIAGNOSIS — Z853 Personal history of malignant neoplasm of breast: Secondary | ICD-10-CM | POA: Diagnosis not present

## 2022-08-03 DIAGNOSIS — E559 Vitamin D deficiency, unspecified: Secondary | ICD-10-CM

## 2022-08-03 DIAGNOSIS — I10 Essential (primary) hypertension: Secondary | ICD-10-CM | POA: Diagnosis not present

## 2022-08-03 DIAGNOSIS — E119 Type 2 diabetes mellitus without complications: Secondary | ICD-10-CM | POA: Diagnosis not present

## 2022-08-03 DIAGNOSIS — K862 Cyst of pancreas: Secondary | ICD-10-CM | POA: Diagnosis not present

## 2022-08-03 DIAGNOSIS — Z9011 Acquired absence of right breast and nipple: Secondary | ICD-10-CM | POA: Diagnosis not present

## 2022-08-03 DIAGNOSIS — C50911 Malignant neoplasm of unspecified site of right female breast: Secondary | ICD-10-CM

## 2022-08-03 NOTE — Progress Notes (Signed)
[facility] [address], [place] [address]    Clinic Day:  08/03/2022  Referring physician: Asencion Noble, MD  Patient Care Team: Asencion Noble, MD as PCP - General (Internal Medicine) Gala Romney Cristopher Estimable, MD as Consulting Physician (Gastroenterology)   ASSESSMENT & PLAN:   Assessment: 1.  Right breast TNBC: -Right mastectomy on 05/15/2020. -Invasive lobular carcinoma, multifocal, 1.3 cm in greatest dimension, grade 2, margins negative, 0/4 lymph nodes involved, Ki-67: 50%, ER/PR negative, HER-2 IHC 1+, FISH negative, PT 1 CPN 0 -CT CAP on 07/06/2020 did not show any evidence of metastatic disease. -Bone scan on 06/22/2020 was negative for metastatic disease. - [facility] genetic testing was negative.   2.  Stage I left breast cancer ER/PR positive and HER-2 negative: - She had mastectomy in 2012. - Mammogram reviewed by me on 03/28/2018 was BI-RADS Category 1 of the right breast. -She apparently did not take any antiestrogen therapy.   3.  Family history: -Mother had colon cancer.  Mother and sister had breast cancer. -Father had lung cancer.    Plan: 1.  T1CN0 invasive lobular carcinoma right breast, ER/PR/HER-2 negative: - Bilateral mastectomy sites are within normal limits with no suspicious nodules. - Reviewed labs today which showed normal LFTs.  CBC was grossly normal.  CA 15-3 was slightly elevated at 34 but stable. - RTC 1 year for follow-up with repeat labs and tumor marker.   2.  Pancreatic cysts: - MRI abdomen on 01/11/2022: Stable appearance of multifocal cystic lesions within the pancreas, largest measuring 1.8 cm. - She does not have any abdominal pains. - Recommend repeating pancreas protocol MRI in August 2025.   Breast Cancer therapy associated bone loss: I have recommended calcium, Vitamin D and weight bearing exercises  Orders Placed This Encounter  Procedures   CBC with Differential    Standing Status:   Future    Standing Expiration Date:    08/03/2023   Comprehensive metabolic panel    Standing Status:   Future    Standing Expiration Date:   08/03/2023   VITAMIN D 25 Hydroxy (Vit-D Deficiency, Fractures)    Standing Status:   Future    Standing Expiration Date:   08/04/2023   Cancer antigen 15-3    Standing Status:   Future    Standing Expiration Date:   08/03/2023      Beverly Gust Oliver,acting as a scribe for Derek Jack, MD.,have documented all relevant documentation on the behalf of Derek Jack, MD,as directed by  Derek Jack, MD while in the presence of Derek Jack, MD.   I, Derek Jack MD, have reviewed the above documentation for accuracy and completeness, and I agree with the above.   Derek Jack, MD   2/21/20245:55 PM  CHIEF COMPLAINT:   Diagnosis: bilateral breast cancer    Cancer Staging  No matching staging information was found for the patient.   Prior Therapy: None  Current Therapy:  None   HISTORY OF PRESENT ILLNESS:   Oncology History  Invasive ductal carcinoma of left breast ([place])  01/27/2014 Initial Diagnosis   Invasive ductal carcinoma of left breast ([place])   06/26/2020 Genetic Testing   Negative genetic testing: no pathogenic variants detected in [facility] Common Hereditary Cancers Panel.  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  The report date is June 26, 2020.   The Common Hereditary Cancers Panel offered by [facility] includes sequencing and/or deletion duplication testing of the following 48 genes:  APC, ATM, AXIN2, BARD1, BMPR1A, BRCA1, BRCA2, BRIP1, CDH1, CDK4, CDKN2A (p14ARF), CDKN2A (p16INK4a), CHEK2, CTNNA1, DICER1, EPCAM (Deletion/duplication testing only), GREM1 (promoter region deletion/duplication testing only), KIT, MEN1, MLH1, MSH2, MSH3, MSH6, MUTYH, NBN, NF1, NHTL1, PALB2, PDGFRA, PMS2, POLD1, POLE, PTEN, RAD50, RAD51C, RAD51D, RNF43, SDHB, SDHC, SDHD, SMAD4, SMARCA4. STK11, TP53, TSC1,  TSC2, and VHL.  The following genes were evaluated for sequence changes only: SDHA and HOXB13 c.251G>A variant only.      INTERVAL HISTORY:   Beverlyn is a 89 y.o. female presenting to clinic today for follow up of bilateral breast cancer . She was last seen by me on 10/25/2021.  Today, she states that she is doing well overall. Her appetite level is at 100%. Her energy level is at 100%. She has 5/10 back pain that radiates to her leg. She denies any new pain. She denies nausea,vomiting, and diarrhea. She no longer takes calcium pills.    PAST MEDICAL HISTORY:   Past Medical History: Past Medical History:  Diagnosis Date   Breast disorder    Diabetes mellitus ([place])    Family history of breast cancer    Family history of colon cancer    Family history of lung cancer    High blood cholesterol level    HX: breast cancer    left   Hypertension    Invasive ductal carcinoma of left breast ([place]) 01/27/2014   ER +    Surgical History: Past Surgical History:  Procedure Laterality Date   BACK SURGERY     lower   CATARACT EXTRACTION, BILATERAL  11/2014   COLONOSCOPY WITH PROPOFOL N/A 01/18/2022   Procedure: COLONOSCOPY WITH PROPOFOL;  Surgeon: Harvel Quale, MD;  [facility];  Service: Gastroenterology;  Laterality: N/A;   ESOPHAGOGASTRODUODENOSCOPY (EGD) WITH PROPOFOL N/A 01/18/2022   Procedure: ESOPHAGOGASTRODUODENOSCOPY (EGD) WITH PROPOFOL;  Surgeon: Harvel Quale, MD;  Location: [facility];  Service: Gastroenterology;  Laterality: N/A;   MASTECTOMY Left    SENTINEL NODE BIOPSY Right 05/15/2020   Procedure: SENTINEL NODE BIOPSY;  Surgeon: Virl Cagey, MD;  Location: [facility];  Service: General;  Laterality: Right;   TOTAL MASTECTOMY Right 05/15/2020   Procedure: TOTAL MASTECTOMY RIGHT BREAST;  Surgeon: Virl Cagey, MD;  Location: [facility];  Service: General;  Laterality: Right;    Social History: Social History   Socioeconomic History    Marital status: Widowed    Spouse name: Not on file   Number of children: Not on file   Years of education: Not on file   Highest education level: Not on file  Occupational History   Not on file  Tobacco Use   Smoking status: Never   Smokeless tobacco: Never  Vaping Use   Vaping Use: Never used  Substance and Sexual Activity   Alcohol use: No   Drug use: No   Sexual activity: Not Currently    Birth control/protection: Surgical    Comment: hyst  Other Topics Concern   Not on file  Social History Narrative   Not on file   Social Determinants of Health   Financial Resource Strain: Low Risk  (10/18/2019)   Overall Financial Resource Strain (CARDIA)    Difficulty of Paying Living Expenses: Not hard at all  Food Insecurity: No Food Insecurity (10/18/2019)   Hunger Vital Sign    Worried About Running Out of Food in the Last Year: Never true    [place] in the Last Year: Never true  Transportation Needs: No Transportation Needs (10/18/2019)   PRAPARE - Hydrologist (Medical): No    Lack of Transportation (Non-Medical): No  Physical Activity: Insufficiently Active (10/18/2019)   Exercise Vital Sign    Days of Exercise per Week: 3 days    Minutes of Exercise per Session: 20 min  Stress: No Stress Concern Present (10/18/2019)   [place]    Feeling of Stress : Only a little  Social Connections: Moderately Isolated (10/18/2019)   Social Connection and Isolation Panel [NHANES]    Frequency of Communication with Friends and Family: Once a week    Frequency of Social Gatherings with Friends and Family: Once a week    Attends Religious Services: More than 4 times per year    Active Member of [employer] or Organizations: Yes    Attends Archivist Meetings: More than 4 times per year    Marital Status: Widowed  Intimate Partner Violence: Not At Risk (10/18/2019)   Humiliation, Afraid, Rape,  and Kick questionnaire    Fear of Current or Ex-Partner: No    Emotionally Abused: No    Physically Abused: No    Sexually Abused: No    Family History: Family History  Problem Relation Age of Onset   Cancer Mother    Breast cancer Mother    Colon cancer Mother    Cancer Sister    Breast cancer Sister    Cancer Sister    Lung cancer Father     Current Medications:  Current Outpatient Medications:    acetaminophen (TYLENOL) 500 MG tablet, Take 500 mg by mouth every 6 (six) hours as needed for moderate pain or headache., Disp: , Rfl:    amLODipine (NORVASC) 5 MG tablet, Take 5 mg by mouth at bedtime. , Disp: , Rfl:    aspirin 81 MG chewable tablet, Chew 81 mg by mouth at bedtime., Disp: , Rfl:    diphenhydramine-acetaminophen (TYLENOL PM) 25-500 MG TABS, Take 1 tablet by mouth at bedtime. , Disp: , Rfl:    gabapentin (NEURONTIN) 300 MG capsule, Take 300 mg by mouth 3 (three) times daily as needed (pain)., Disp: , Rfl:    lisinopril-hydrochlorothiazide (ZESTORETIC) 20-12.5 MG tablet, Take 2 tablets by mouth daily., Disp: , Rfl:    metFORMIN (GLUCOPHAGE-XR) 500 MG 24 hr tablet, Take 500 mg by mouth 2 (two) times daily. , Disp: , Rfl:    Multiple Vitamins-Minerals (CENTRUM SILVER PO), Take 1 tablet by mouth daily., Disp: , Rfl:    nitrofurantoin (MACRODANTIN) 50 MG capsule, Take 50 mg by mouth daily., Disp: , Rfl:    Polyethyl Glycol-Propyl Glycol (SYSTANE OP), Place 1 drop into both eyes daily as needed (dry eyes)., Disp: , Rfl:    polyethylene glycol (MIRALAX / GLYCOLAX) packet, Take 17 g by mouth at bedtime. , Disp: , Rfl:    calcium carbonate (OSCAL) 1500 (600 Ca) MG TABS tablet, Take 600 mg of elemental calcium by mouth daily. (Patient not taking: Reported on 08/03/2022), Disp: , Rfl:    Allergies: Allergies  Allergen Reactions   Codeine Nausea And Vomiting    REVIEW OF SYSTEMS:   Review of Systems  Constitutional:  Negative for chills, fatigue and fever.  HENT:   Negative  for lump/mass, mouth sores, nosebleeds, sore throat and trouble swallowing.   Eyes:  Negative for eye problems.  Respiratory:  Negative for cough and shortness of breath.   Cardiovascular:  Negative  for chest pain, leg swelling and palpitations.  Gastrointestinal:  Negative for abdominal pain, constipation, diarrhea, nausea and vomiting.  Genitourinary:  Negative for bladder incontinence, difficulty urinating, dysuria, frequency, hematuria and nocturia.   Musculoskeletal:  Negative for arthralgias, back pain, flank pain, myalgias and neck pain.  Skin:  Negative for itching and rash.  Neurological:  Negative for dizziness, headaches and numbness.  Hematological:  Does not bruise/bleed easily.  Psychiatric/Behavioral:  Negative for depression, sleep disturbance and suicidal ideas. The patient is not nervous/anxious.   All other systems reviewed and are negative.    VITALS:   Blood pressure 137/77, pulse 80, temperature (!) 97.3 F (36.3 C), temperature source Oral, resp. rate 16, weight 143 lb (64.9 kg), SpO2 97 %.  Wt Readings from Last 3 Encounters:  08/03/22 143 lb (64.9 kg)  01/18/22 137 lb 1.6 oz (62.2 kg)  01/12/22 141 lb 12.8 oz (64.3 kg)    Body mass index is 23.8 kg/m.  Performance status (ECOG): 1 - Symptomatic but completely ambulatory  PHYSICAL EXAM:   Physical Exam Vitals and nursing note reviewed. Exam conducted with a chaperone present.  Constitutional:      Appearance: Normal appearance.  Cardiovascular:     Rate and Rhythm: Normal rate and regular rhythm.     Pulses: Normal pulses.     Heart sounds: Normal heart sounds.  Pulmonary:     Effort: Pulmonary effort is normal.     Breath sounds: Normal breath sounds.  Chest:     Comments: Bilateral mastectomy WNL Abdominal:     Palpations: Abdomen is soft. There is no hepatomegaly, splenomegaly or mass.     Tenderness: There is no abdominal tenderness.  Musculoskeletal:     Right lower leg: No edema.     Left  lower leg: No edema.  Lymphadenopathy:     Cervical: No cervical adenopathy.     Right cervical: No superficial, deep or posterior cervical adenopathy.    Left cervical: No superficial, deep or posterior cervical adenopathy.     Upper Body:     Right upper body: No supraclavicular or axillary adenopathy.     Left upper body: No supraclavicular or axillary adenopathy.  Neurological:     General: No focal deficit present.     Mental Status: She is alert and oriented to person, place, and time.  Psychiatric:        Mood and Affect: Mood normal.        Behavior: Behavior normal.     LABS:      Latest Ref Rng & Units 07/26/2022   11:16 AM 01/18/2022    4:17 AM 01/17/2022    7:55 AM  CBC  WBC 4.0 - 10.5 K/uL 7.7  9.1  6.2   Hemoglobin 12.0 - 15.0 g/dL 13.7  9.1  8.5   Hematocrit 36.0 - 46.0 % 42.0  27.8  26.4   Platelets 150 - 400 K/uL 179  472  447       Latest Ref Rng & Units 07/26/2022   11:16 AM 01/18/2022    4:17 AM 01/16/2022    3:27 AM  CMP  Glucose 70 - 99 mg/dL 109  112  156   BUN 8 - 23 mg/dL 18  6  17   $ Creatinine 0.44 - 1.00 mg/dL 0.71  0.58  0.75   Sodium 135 - 145 mmol/L 133  140  135   Potassium 3.5 - 5.1 mmol/L 3.9  3.9  3.9   Chloride  98 - 111 mmol/L 96  110  103   CO2 22 - 32 mmol/L 26  24  25   $ Calcium 8.9 - 10.3 mg/dL 10.4  8.9  9.4   Total Protein 6.5 - 8.1 g/dL 7.2   6.7   Total Bilirubin 0.3 - 1.2 mg/dL 0.4   0.4   Alkaline Phos 38 - 126 U/L 84   56   AST 15 - 41 U/L 20   22   ALT 0 - 44 U/L 18   25      No results found for: "CEA1", "CEA" / No results found for: "CEA1", "CEA" No results found for: "PSA1" Lab Results  Component Value Date   [id number] <2 01/11/2022   No results found for: "CAN125"  No results found for: "TOTALPROTELP", "ALBUMINELP", "A1GS", "A2GS", "BETS", "BETA2SER", "GAMS", "MSPIKE", "SPEI" Lab Results  Component Value Date   TIBC 399 01/17/2022   FERRITIN 10 (L) 01/17/2022   IRONPCTSAT 5 (L) 01/17/2022   No results found for:  "LDH"   STUDIES:   No results found.

## 2022-08-03 NOTE — Patient Instructions (Signed)
[place] at [facility] Discharge Instructions   You were seen and examined today by Dr. Delton Coombes.  He reviewed the results of your lab work which are normal/stable.   He reviewed the results of the MRI you had in August of 2023 which was normal/stable.   We will see you back in one year. We will repeat lab work prior to your next visit.    Thank you for choosing [facility] to provide your oncology and hematology care.  To afford each patient quality time with our provider, please arrive at least 15 minutes before your scheduled appointment time.   If you have a lab appointment with the [facility] please come in thru the Main Entrance and check in at the main information desk.  You need to re-schedule your appointment should you arrive 10 or more minutes late.  We strive to give you quality time with our providers, and arriving late affects you and other patients whose appointments are after yours.  Also, if you no show three or more times for appointments you may be dismissed from the clinic at the providers discretion.     Again, thank you for choosing [facility].  Our hope is that these requests will decrease the amount of time that you wait before being seen by our physicians.       _____________________________________________________________  Should you have questions after your visit to [facility], please contact our office at [phone] and follow the prompts.  Our office hours are 8:00 a.m. and 4:30 p.m. Monday - Friday.  Please note that voicemails left after 4:00 p.m. may not be returned until the following business day.  We are closed weekends and major holidays.  You do have access to a nurse 24-7, just call the main number to the clinic [phone] and do not press any options, hold on the line and a nurse will answer the phone.    For prescription refill requests, have your  pharmacy contact our office and allow 72 hours.    Due to Covid, you will need to wear a mask upon entering the hospital. If you do not have a mask, a mask will be given to you at the Main Entrance upon arrival. For doctor visits, patients may have 1 support person age 46 or older with them. For treatment visits, patients can not have anyone with them due to social distancing guidelines and our immunocompromised population.

## 2022-09-08 ENCOUNTER — Other Ambulatory Visit (INDEPENDENT_AMBULATORY_CARE_PROVIDER_SITE_OTHER): Payer: Medicare Other

## 2022-09-08 ENCOUNTER — Ambulatory Visit (INDEPENDENT_AMBULATORY_CARE_PROVIDER_SITE_OTHER): Payer: Medicare Other | Admitting: Orthopaedic Surgery

## 2022-09-08 ENCOUNTER — Encounter: Payer: Self-pay | Admitting: Orthopaedic Surgery

## 2022-09-08 VITALS — Ht 65.5 in | Wt 140.0 lb

## 2022-09-08 DIAGNOSIS — G8929 Other chronic pain: Secondary | ICD-10-CM

## 2022-09-08 DIAGNOSIS — M542 Cervicalgia: Secondary | ICD-10-CM | POA: Diagnosis not present

## 2022-09-08 DIAGNOSIS — M545 Low back pain, unspecified: Secondary | ICD-10-CM | POA: Diagnosis not present

## 2022-09-08 MED ORDER — PREDNISONE 5 MG (21) PO TBPK: ORAL_TABLET | ORAL | 0 refills | Status: DC

## 2022-09-08 NOTE — Progress Notes (Signed)
Office Visit Note   Patient: Christine Sutton           Date of Birth: [birth year]           MRN: [id number] Visit Date: 09/08/2022              Requested by: Asencion Noble, MD [address] [place],  [place] [address] PCP: Asencion Noble, MD   Assessment & Plan: Visit Diagnoses:  1. Neck pain   2. Chronic bilateral low back pain, unspecified whether sciatica present     Plan: Will send in a reduced dose prednisone Dosepak 5 mg she will check her sugars with the go over 200 she can stop the prednisone.  Set her up for some physical therapy and any pain outpatient for low back pain post fusion for modalities to help with her pain that is recently flared.  I can recheck her in 4 weeks.  Follow-Up Instructions: No follow-ups on file.   Orders:  Orders Placed This Encounter  Procedures   XR Cervical Spine 2 or 3 views   XR Lumbar Spine 2-3 Views   No orders of the defined types were placed in this encounter.     Procedures: No procedures performed   Clinical Data: No additional findings.   Subjective: Chief Complaint  Patient presents with   Neck - Pain   Lower Back - Pain    HPI 89 year old female with back pain worse than neck pain.  Previous surgery by Dr. With interspinous device at L5-S1 later fusion at L4-5 with pedicle screws on the right side only.  She has significant lumbar curvature.  She got some relief after many weeks using the traction but was using it facing away from the door.  She has not been through any therapy.  She is use gabapentin and Tylenol.  Does have some diabetes A1c less than 7.  She has problems with prolonged sitting at church.  She is use some ice with relief lidocaine patches.  Back pain is across lumbosacral junction sometimes it radiates into her right leg to the mid lower leg occasionally to the foot.  She has a cane uses a walker at home.  She can walk hang onto forearm for balance.  Review of Systems all systems  noncontributory.   Objective: Vital Signs: Ht 5' 5.5" (1.664 m)   Wt 140 lb (63.5 kg)   BMI 22.94 kg/m   Physical Exam Constitutional:      Appearance: She is well-developed.  HENT:     Head: Normocephalic.     Right Ear: External ear normal.     Left Ear: External ear normal. There is no impacted cerumen.  Eyes:     Pupils: Pupils are equal, round, and reactive to light.  Neck:     Thyroid: No thyromegaly.     Trachea: No tracheal deviation.  Cardiovascular:     Rate and Rhythm: Normal rate.  Pulmonary:     Effort: Pulmonary effort is normal.  Abdominal:     Palpations: Abdomen is soft.  Musculoskeletal:     Cervical back: No rigidity.  Skin:    General: Skin is warm and dry.  Neurological:     Mental Status: She is alert and oriented to person, place, and time.  Psychiatric:        Behavior: Behavior normal.     Ortho Exam in foot dorsiflexion plantarflexion is intact.  No pitting edema lower extremities.  Good capillary refill.  Patient  has good quad strength.  Specialty Comments:  No specialty comments available.  Imaging: No results found.   PMFS History: Patient Active Problem List   Diagnosis Date Noted   Blood loss anemia 01/16/2022   AKI (acute kidney injury) ([facility]) 01/16/2022   GI bleed 01/15/2022   History of rectal bleeding 05/04/2021   Other spondylosis with radiculopathy, cervical region 01/14/2021   Foraminal stenosis of cervical region 01/14/2021   Family history of breast cancer    Family history of colon cancer    Family history of lung cancer    Genetic testing 06/28/2020   Breast cancer ([facility]) 05/15/2020   Malignant neoplasm of upper-outer quadrant of right breast in female, estrogen receptor negative ([facility]) 04/28/2020   History of breast cancer 10/18/2019   Pelvic pressure in female 10/18/2019   Left lower quadrant abdominal tenderness without rebound tenderness 10/18/2019   Vulvar itching 10/18/2019   Screening for colorectal cancer  10/18/2019   Invasive ductal carcinoma of left breast ([facility]) 01/27/2014   Diabetes mellitus due to underlying condition without complications ([facility]) [id number]   Hypertension, essential, benign 12/24/2013   Past Medical History:  Diagnosis Date   Breast disorder    Diabetes mellitus ([facility])    Family history of breast cancer    Family history of colon cancer    Family history of lung cancer    High blood cholesterol level    HX: breast cancer    left   Hypertension    Invasive ductal carcinoma of left breast ([facility]) 01/27/2014   ER +    Family History  Problem Relation Age of Onset   Cancer Mother    Breast cancer Mother    Colon cancer Mother    Cancer Sister    Breast cancer Sister    Cancer Sister    Lung cancer Father     Past Surgical History:  Procedure Laterality Date   BACK SURGERY     lower   CATARACT EXTRACTION, BILATERAL  11/2014   COLONOSCOPY WITH PROPOFOL N/A 01/18/2022   Procedure: COLONOSCOPY WITH PROPOFOL;  Surgeon: Harvel Quale, MD;  [facility];  Service: Gastroenterology;  Laterality: N/A;   ESOPHAGOGASTRODUODENOSCOPY (EGD) WITH PROPOFOL N/A 01/18/2022   Procedure: ESOPHAGOGASTRODUODENOSCOPY (EGD) WITH PROPOFOL;  Surgeon: Harvel Quale, MD;  [facility]: [facility];  Service: Gastroenterology;  Laterality: N/A;   MASTECTOMY Left    SENTINEL NODE BIOPSY Right 05/15/2020   Procedure: SENTINEL NODE BIOPSY;  Surgeon: Virl Cagey, MD;  Location: [facility];  Service: General;  Laterality: Right;   TOTAL MASTECTOMY Right 05/15/2020   Procedure: TOTAL MASTECTOMY RIGHT BREAST;  Surgeon: Virl Cagey, MD;  Location: [facility];  Service: General;  Laterality: Right;   Social History   Occupational History   Not on file  Tobacco Use   Smoking status: Never   Smokeless tobacco: Never  Vaping Use   Vaping Use: Never used  Substance and Sexual Activity   Alcohol use: No   Drug use: No   Sexual activity: Not Currently    Birth  control/protection: Surgical    Comment: hyst

## 2022-09-22 DIAGNOSIS — E1129 Type 2 diabetes mellitus with other diabetic kidney complication: Secondary | ICD-10-CM | POA: Diagnosis not present

## 2022-09-22 DIAGNOSIS — E785 Hyperlipidemia, unspecified: Secondary | ICD-10-CM | POA: Diagnosis not present

## 2022-09-22 DIAGNOSIS — D509 Iron deficiency anemia, unspecified: Secondary | ICD-10-CM | POA: Diagnosis not present

## 2022-09-22 DIAGNOSIS — I2510 Atherosclerotic heart disease of native coronary artery without angina pectoris: Secondary | ICD-10-CM | POA: Diagnosis not present

## 2022-09-27 ENCOUNTER — Encounter (HOSPITAL_COMMUNITY): Payer: Self-pay | Admitting: Physical Therapy

## 2022-09-27 ENCOUNTER — Ambulatory Visit (HOSPITAL_COMMUNITY): Payer: Medicare Other | Attending: Orthopaedic Surgery | Admitting: Physical Therapy

## 2022-09-27 ENCOUNTER — Other Ambulatory Visit: Payer: Self-pay

## 2022-09-27 DIAGNOSIS — R29898 Other symptoms and signs involving the musculoskeletal system: Secondary | ICD-10-CM | POA: Insufficient documentation

## 2022-09-27 DIAGNOSIS — M6281 Muscle weakness (generalized): Secondary | ICD-10-CM | POA: Diagnosis not present

## 2022-09-27 DIAGNOSIS — M5412 Radiculopathy, cervical region: Secondary | ICD-10-CM | POA: Diagnosis not present

## 2022-09-27 DIAGNOSIS — R2689 Other abnormalities of gait and mobility: Secondary | ICD-10-CM | POA: Diagnosis not present

## 2022-09-27 DIAGNOSIS — M545 Low back pain, unspecified: Secondary | ICD-10-CM | POA: Insufficient documentation

## 2022-09-27 DIAGNOSIS — R293 Abnormal posture: Secondary | ICD-10-CM | POA: Insufficient documentation

## 2022-09-27 DIAGNOSIS — M542 Cervicalgia: Secondary | ICD-10-CM | POA: Insufficient documentation

## 2022-09-27 DIAGNOSIS — M5459 Other low back pain: Secondary | ICD-10-CM | POA: Diagnosis not present

## 2022-09-27 DIAGNOSIS — G8929 Other chronic pain: Secondary | ICD-10-CM | POA: Insufficient documentation

## 2022-09-27 NOTE — Therapy (Signed)
OUTPATIENT PHYSICAL THERAPY THORACOLUMBAR EVALUATION   Patient Name: Christine Sutton MRN: [id number] DOB:[birth year], 89 y.o., female Today's Date: 09/27/2022  END OF SESSION:  PT End of Session - 09/27/22 1104     Visit Number 1    Number of Visits 12    Date for PT Re-Evaluation 11/08/22    Authorization Type Medicare/BCBS    Progress Note Due on Visit 19    PT Start Time 1108    PT Stop Time 1158    PT Time Calculation (min) 50 min    Activity Tolerance Patient tolerated treatment well    Behavior During Therapy WFL for tasks assessed/performed             Past Medical History:  Diagnosis Date   Breast disorder    Diabetes mellitus    Family history of breast cancer    Family history of colon cancer    Family history of lung cancer    High blood cholesterol level    HX: breast cancer    left   Hypertension    Invasive ductal carcinoma of left breast 01/27/2014   ER +   Past Surgical History:  Procedure Laterality Date   BACK SURGERY     lower   CATARACT EXTRACTION, BILATERAL  11/2014   COLONOSCOPY WITH PROPOFOL N/A 01/18/2022   Procedure: COLONOSCOPY WITH PROPOFOL;  Surgeon: Dolores Frame, MD;  [facility];  Service: Gastroenterology;  Laterality: N/A;   ESOPHAGOGASTRODUODENOSCOPY (EGD) WITH PROPOFOL N/A 01/18/2022   Procedure: ESOPHAGOGASTRODUODENOSCOPY (EGD) WITH PROPOFOL;  Surgeon: Dolores Frame, MD;  [facility];  Service: Gastroenterology;  Laterality: N/A;   MASTECTOMY Left    SENTINEL NODE BIOPSY Right 05/15/2020   Procedure: SENTINEL NODE BIOPSY;  Surgeon: Lucretia Roers, MD;  Location: [facility];  Service: General;  Laterality: Right;   TOTAL MASTECTOMY Right 05/15/2020   Procedure: TOTAL MASTECTOMY RIGHT BREAST;  Surgeon: Lucretia Roers, MD;  Location: [facility];  Service: General;  Laterality: Right;   Patient Active Problem List   Diagnosis Date Noted   Blood loss anemia 01/16/2022   AKI (acute kidney  injury) 01/16/2022   GI bleed 01/15/2022   History of rectal bleeding 05/04/2021   Other spondylosis with radiculopathy, cervical region 01/14/2021   Foraminal stenosis of cervical region 01/14/2021   Family history of breast cancer    Family history of colon cancer    Family history of lung cancer    Genetic testing 06/28/2020   Breast cancer 05/15/2020   Malignant neoplasm of upper-outer quadrant of right breast in female, estrogen receptor negative 04/28/2020   History of breast cancer 10/18/2019   Pelvic pressure in female 10/18/2019   Left lower quadrant abdominal tenderness without rebound tenderness 10/18/2019   Vulvar itching 10/18/2019   Screening for colorectal cancer 10/18/2019   Invasive ductal carcinoma of left breast 01/27/2014   Diabetes mellitus due to underlying condition without complications ([facility]) 12/24/2013   Hypertension, essential, benign 12/24/2013    PCP: Carylon Perches MD  REFERRING PROVIDER: Eldred Manges, MD  REFERRING DIAG: M54.50,G89.29 (ICD-10-CM) - Chronic bilateral low back pain, unspecified whether sciatica present  Rationale for Evaluation and Treatment: Rehabilitation  THERAPY DIAG:  Other low back pain  Muscle weakness (generalized)  Other abnormalities of gait and mobility  Other symptoms and signs involving the musculoskeletal system  Cervicalgia  Abnormal posture  ONSET DATE: chronic  SUBJECTIVE:  SUBJECTIVE STATEMENT: Patient states neck and low back pain. She did some self traction at home with water device. Been using tens on neck and massage tool. Back doesn't bother her with sitting. Neck bothers her all the time. Had back surgery and was alright for a while. PT helped her last time she was in. She eventually stopped doing HEP provided. Can't sit  very long due to symptoms. Patient states low back pain and symptoms into R leg burning to mid calf region. Tylenol and gabapentin seem to help and lidocaine. Wants to work on her neck and back issues. Hasn't been able to sleep in bed in over a year until recently getting an adjustable bed and has been sleeping in it for a week.  PERTINENT HISTORY:  Hx of interspinous device at L5-S1 later fusion at L4-5 with pedicle screws on the right side only, hx breast cancer, DM, HTN, neck pain, chronic LBP  PAIN:  Are you having pain? Yes: NPRS scale: 5/10 Pain location: low back and neck Pain description: burning Aggravating factors: sitting Relieving factors: Tylenol and gabapentin, lidocaine  PRECAUTIONS: Fall  WEIGHT BEARING RESTRICTIONS: No  FALLS:  Has patient fallen in last 6 months? No  OCCUPATION: Retired  PLOF: Independent  PATIENT GOALS: to get better  NEXT MD VISIT: next week  OBJECTIVE:   OBSERVATION: lateral lean to R in sitting most of session   DIAGNOSTIC FINDINGS:  XR 09/08/22 [facility] lateral lumbar spine images are obtained and reviewed this shows right  pedicle instrumentation with cage at L4-5.  Interspinous device L5-S1.   Scoliosis curve approximately 30 degrees right lumbar apex at L2-3  junction.  Considerable facet arthropathy noted.  No compression  fractures.  Endplate sclerosis at L2-3 L3-4.   Impression: Previous fusion as described above.  Scoliosis with  degenerative changes.   PATIENT SURVEYS: FOTO 38% function  SCREENING FOR RED FLAGS: Bowel or bladder incontinence: No Spinal tumors: No Cauda equina syndrome: No Compression fracture: No Abdominal aneurysm: No  COGNITION: Overall cognitive status: Within functional limits for tasks assessed     SENSATION: WFL  POSTURE: rounded shoulders, forward head, and R lateral lean  and trunk flexion due to  scoliosis  PALPATION: TTP L cervical paraspinals, R lumbar paraspinals, glutes  LUMBAR ROM:    AROM eval  Flexion 0% limited *  Extension 75% limited  Right lateral flexion 25% limited  Left lateral flexion 50% limited  Right rotation 90% limited  Left rotation 90%limited   (Blank rows = not tested) *= pain, rotation mostly through shoulders/thoracic spine minimal thoraco/lumbar Cervical  ROM:   AROM eval  Flexion 0% limited *  Extension 25% limited  Right lateral flexion 50% limited  Left lateral flexion 50% limited  Right rotation 25% limited  Left rotation 75% limited   (Blank rows = not tested) *= pain, L sided neck pain in UT region  LOWER EXTREMITY ROM:   WFL for tasks assessed  Active  Right eval Left eval  Hip flexion    Hip extension    Hip abduction    Hip adduction    Hip internal rotation    Hip external rotation    Knee flexion    Knee extension    Ankle dorsiflexion    Ankle plantarflexion    Ankle inversion    Ankle eversion     (Blank rows = not tested)  LOWER EXTREMITY MMT:    MMT Right eval Left eval  Hip flexion 4 4+  Hip extension    Hip abduction    Hip adduction    Hip internal rotation    Hip external rotation    Knee flexion 5 5  Knee extension 5 5  Ankle dorsiflexion 5 5  Ankle plantarflexion    Ankle inversion    Ankle eversion     (Blank rows = not tested)    FUNCTIONAL TESTS:  5 times sit to stand: 25.22 seconds without UE support for 3 reps, UE on thighs for 2 reps. Relies LLE>RLE 2 minute walk test: 195 feet  GAIT: Distance walked: 195 feet Assistive device utilized: None Level of assistance: Modified independence Comments: , trunk R lateral lean and flexion, decreased stride length, decreased trunk/UE rotation  TODAY'S TREATMENT:                                                                                                                              DATE:  09/27/22 Correct seated posture x 10     PATIENT EDUCATION:  Education details: Patient educated on exam findings, POC, scope of PT, HEP,  and posture/scoliosis. Person educated: Patient Education method: Explanation, Demonstration, and Handouts Education comprehension: verbalized understanding, returned demonstration, verbal cues required, and tactile cues required  HOME EXERCISE PROGRAM: Access Code: [email or web address] URL: [email or web address] Date: 09/27/2022 - Correct Seated Posture  - 3 x daily - 7 x weekly - 2 sets - 10 reps  ASSESSMENT:  CLINICAL IMPRESSION: Patient a 89 y.o. y.o. female who was seen today for physical therapy evaluation and treatment for chronic LBP. Patient presents with pain limited deficits in lumbar spine and LE strength, ROM, endurance, activity tolerance, gait, balance, and functional mobility with ADL. Patient is having to modify and restrict ADL as indicated by outcome measure score as well as subjective information and objective measures which is affecting overall participation. Patient will benefit from skilled physical therapy in order to improve function and reduce impairment.  OBJECTIVE IMPAIRMENTS: Abnormal gait, decreased activity tolerance, decreased balance, decreased endurance, decreased mobility, difficulty walking, decreased ROM, decreased strength, hypomobility, increased muscle spasms, impaired flexibility, improper body mechanics, postural dysfunction, and pain.   ACTIVITY LIMITATIONS: carrying, lifting, bending, standing, squatting, stairs, transfers, reach over head, locomotion level, and caring for others  PARTICIPATION LIMITATIONS: meal prep, cleaning, laundry, shopping, community activity, and yard work  PERSONAL FACTORS: Age, Time since onset of injury/illness/exacerbation, and 3+ comorbidities:  hx LBP/surgery, hx breast cancer, DM, HTN, neck pain, chronic LBP, scoliosis  are also affecting patient's functional outcome.   REHAB POTENTIAL: Fair    CLINICAL DECISION MAKING: Stable/uncomplicated  EVALUATION COMPLEXITY: Low   GOALS: Goals reviewed with patient?  Yes  SHORT TERM GOALS: Target date: 10/18/2022    Patient will be independent with HEP in order to improve functional outcomes. Baseline:  Goal status: INITIAL  2.  Patient will report at least 25% improvement in symptoms for improved quality of life. Baseline:  Goal status: INITIAL   LONG TERM  GOALS: Target date: 11/08/2022    Patient will report at least 75% improvement in symptoms for improved quality of life. Baseline:  Goal status: INITIAL  2.  Patient will improve FOTO score by at least 10 points in order to indicate improved tolerance to activity. Baseline: 38% function Goal status: INITIAL  3.  Patient will demonstrate at least 25% improvement in thoracolumbar and cerivcal ROM in all restricted planes for improved ability to move trunk and neck while completing chores. Baseline: see above Goal status: INITIAL  4.  Patient will be able to ambulate at least 250 feet in in order to demonstrate improved tolerance to activity. Baseline: 195 feet Goal status: INITIAL  5.  Patient will be able to complete 5x STS in under 11.4 seconds in order to reduce the risk of falls and demo improved LE strength for household activities. Baseline: 25.22 seconds without UE support for 3 reps, UE on thighs for 2 reps. Relies LLE>RLE Goal status: INITIAL     PLAN:  PT FREQUENCY: 2x/week  PT DURATION: 6 weeks  PLANNED INTERVENTIONS: Therapeutic exercises, Therapeutic activity, Neuromuscular re-education, Balance training, Gait training, Patient/Family education, Joint manipulation, Joint mobilization, Stair training, Orthotic/Fit training, DME instructions, Aquatic Therapy, Dry Needling, Electrical stimulation, Spinal manipulation, Spinal mobilization, Cryotherapy, Moist heat, Compression bandaging, scar mobilization, Splintting, Taping, Traction, Ultrasound, Ionotophoresis 4mg /ml Dexamethasone, and Manual therapy  PLAN FOR NEXT SESSION: core, hip, postural, and functional  strengthening; spinal mobility, posture   Reola Mosher Merla Sawka, PT 09/27/2022, 12:05 PM

## 2022-09-28 ENCOUNTER — Telehealth: Payer: Self-pay | Admitting: Orthopaedic Surgery

## 2022-09-28 DIAGNOSIS — M542 Cervicalgia: Secondary | ICD-10-CM

## 2022-09-28 NOTE — Telephone Encounter (Signed)
Patient is having PT on her back and not her neck. Would like the neck added to her referral.

## 2022-09-28 NOTE — Telephone Encounter (Signed)
Referral entered. I called patient and advised. 

## 2022-09-28 NOTE — Telephone Encounter (Signed)
Ok to add neck to PT?

## 2022-09-29 ENCOUNTER — Ambulatory Visit (HOSPITAL_COMMUNITY): Payer: Medicare Other | Admitting: Physical Therapy

## 2022-09-29 DIAGNOSIS — M6281 Muscle weakness (generalized): Secondary | ICD-10-CM | POA: Diagnosis not present

## 2022-09-29 DIAGNOSIS — R2689 Other abnormalities of gait and mobility: Secondary | ICD-10-CM

## 2022-09-29 DIAGNOSIS — R29898 Other symptoms and signs involving the musculoskeletal system: Secondary | ICD-10-CM

## 2022-09-29 DIAGNOSIS — R7309 Other abnormal glucose: Secondary | ICD-10-CM | POA: Diagnosis not present

## 2022-09-29 DIAGNOSIS — Z87440 Personal history of urinary (tract) infections: Secondary | ICD-10-CM | POA: Diagnosis not present

## 2022-09-29 DIAGNOSIS — M542 Cervicalgia: Secondary | ICD-10-CM | POA: Diagnosis not present

## 2022-09-29 DIAGNOSIS — I10 Essential (primary) hypertension: Secondary | ICD-10-CM | POA: Diagnosis not present

## 2022-09-29 DIAGNOSIS — M5459 Other low back pain: Secondary | ICD-10-CM

## 2022-09-29 DIAGNOSIS — E1122 Type 2 diabetes mellitus with diabetic chronic kidney disease: Secondary | ICD-10-CM | POA: Diagnosis not present

## 2022-09-29 DIAGNOSIS — M5412 Radiculopathy, cervical region: Secondary | ICD-10-CM

## 2022-09-29 DIAGNOSIS — R293 Abnormal posture: Secondary | ICD-10-CM | POA: Diagnosis not present

## 2022-09-29 NOTE — Therapy (Signed)
OUTPATIENT PHYSICAL THERAPY TREATMENT  Patient Name: Christine Sutton MRN: [id number] DOB:10-10-1929, 89 y.o., female Today's Date: 09/29/2022  END OF SESSION:  PT End of Session - 09/29/22 1311     Visit Number 2    Number of Visits 12    Date for PT Re-Evaluation 11/08/22    Authorization Type Medicare/BCBS    Progress Note Due on Visit 19    PT Start Time 1305    PT Stop Time 1350    PT Time Calculation (min) 45 min    Activity Tolerance Patient tolerated treatment well    Behavior During Therapy WFL for tasks assessed/performed             Past Medical History:  Diagnosis Date   Breast disorder    Diabetes mellitus    Family history of breast cancer    Family history of colon cancer    Family history of lung cancer    High blood cholesterol level    HX: breast cancer    left   Hypertension    Invasive ductal carcinoma of left breast 01/27/2014   ER +   Past Surgical History:  Procedure Laterality Date   BACK SURGERY     lower   CATARACT EXTRACTION, BILATERAL  11/2014   COLONOSCOPY WITH PROPOFOL N/A 01/18/2022   Procedure: COLONOSCOPY WITH PROPOFOL;  Surgeon: Dolores Frame, MD;  Location: [facility];  Service: Gastroenterology;  Laterality: N/A;   ESOPHAGOGASTRODUODENOSCOPY (EGD) WITH PROPOFOL N/A 01/18/2022   Procedure: ESOPHAGOGASTRODUODENOSCOPY (EGD) WITH PROPOFOL;  Surgeon: Dolores Frame, MD;  Location: [facility];  Service: Gastroenterology;  Laterality: N/A;   MASTECTOMY Left    SENTINEL NODE BIOPSY Right 05/15/2020   Procedure: SENTINEL NODE BIOPSY;  Surgeon: Lucretia Roers, MD;  Location: [facility];  Service: General;  Laterality: Right;   TOTAL MASTECTOMY Right 05/15/2020   Procedure: TOTAL MASTECTOMY RIGHT BREAST;  Surgeon: Lucretia Roers, MD;  Location: [facility];  Service: General;  Laterality: Right;   Patient Active Problem List   Diagnosis Date Noted   Blood loss anemia 01/16/2022   AKI (acute kidney injury) 01/16/2022    GI bleed 01/15/2022   History of rectal bleeding 05/04/2021   Other spondylosis with radiculopathy, cervical region 01/14/2021   Foraminal stenosis of cervical region 01/14/2021   Family history of breast cancer    Family history of colon cancer    Family history of lung cancer    Genetic testing 06/28/2020   Breast cancer 05/15/2020   Malignant neoplasm of upper-outer quadrant of right breast in female, estrogen receptor negative 04/28/2020   History of breast cancer 10/18/2019   Pelvic pressure in female 10/18/2019   Left lower quadrant abdominal tenderness without rebound tenderness 10/18/2019   Vulvar itching 10/18/2019   Screening for colorectal cancer 10/18/2019   Invasive ductal carcinoma of left breast 01/27/2014   Diabetes mellitus due to underlying condition without complications (HCC) 12/24/2013   Hypertension, essential, benign 12/24/2013    PCP: Carylon Perches MD  REFERRING PROVIDER: Eldred Manges, MD  REFERRING DIAG: M54.50,G89.29 (ICD-10-CM) - Chronic bilateral low back pain, unspecified whether sciatica present  Rationale for Evaluation and Treatment: Rehabilitation  THERAPY DIAG:  Other low back pain  Muscle weakness (generalized)  Other abnormalities of gait and mobility  Other symptoms and signs involving the musculoskeletal system  Radiculopathy, cervical region  Abnormal posture  Cervicalgia  ONSET DATE: chronic  SUBJECTIVE:  SUBJECTIVE STATEMENT: Pt states she left her HEP last visit and needs a new one.  Currently 8/10 for her neck and unable to give a number for her back, however reports she just took gabapentin for that and was 10/10 before taking it.  States she sleeping in her bed and using Lanacane cream on her neck and back which helps for a little while.     Evaluation: Patient states neck and low back pain. She did some self traction at home with water device. Been using tens on neck and massage tool. Back doesn't bother her with sitting. Neck bothers her all the time. Had back surgery and was alright for a while. PT helped her last time she was in. She eventually stopped doing HEP provided. Can't sit very long due to symptoms. Patient states low back pain and symptoms into R leg burning to mid calf region. Tylenol and gabapentin seem to help and lidocaine. Wants to work on her neck and back issues. Hasn't been able to sleep in bed in over a year until recently getting an adjustable bed and has been sleeping in it for a week.  PERTINENT HISTORY:  Hx of interspinous device at L5-S1 later fusion at L4-5 with pedicle screws on the right side only, hx breast cancer, DM, HTN, neck pain, chronic LBP  PAIN:  Are you having pain? Yes: NPRS scale: 5/10 Pain location: low back and neck Pain description: burning Aggravating factors: sitting Relieving factors: Tylenol and gabapentin, lidocaine  PRECAUTIONS: Fall  WEIGHT BEARING RESTRICTIONS: No  FALLS:  Has patient fallen in last 6 months? No  OCCUPATION: Retired  PLOF: Independent  PATIENT GOALS: to get better  NEXT MD VISIT: next week  OBJECTIVE:   OBSERVATION: lateral lean to R in sitting most of session   DIAGNOSTIC FINDINGS:  XR 09/08/22 [facility] lateral lumbar spine images are obtained and reviewed this shows right  pedicle instrumentation with cage at L4-5.  Interspinous device L5-S1.   Scoliosis curve approximately 30 degrees right lumbar apex at L2-3  junction.  Considerable facet arthropathy noted.  No compression  fractures.  Endplate sclerosis at L2-3 L3-4.   Impression: Previous fusion as described above.  Scoliosis with  degenerative changes.   PATIENT SURVEYS: FOTO 38% function  SCREENING FOR RED FLAGS: Bowel or bladder incontinence: No Spinal tumors: No Cauda equina  syndrome: No Compression fracture: No Abdominal aneurysm: No  COGNITION: Overall cognitive status: Within functional limits for tasks assessed     SENSATION: WFL  POSTURE: rounded shoulders, forward head, and R lateral lean  and trunk flexion due to  scoliosis  PALPATION: TTP L cervical paraspinals, R lumbar paraspinals, glutes  LUMBAR ROM:   AROM eval  Flexion 0% limited *  Extension 75% limited  Right lateral flexion 25% limited  Left lateral flexion 50% limited  Right rotation 90% limited  Left rotation 90%limited   (Blank rows = not tested) *= pain, rotation mostly through shoulders/thoracic spine minimal thoraco/lumbar Cervical  ROM:   AROM eval  Flexion 0% limited *  Extension 25% limited  Right lateral flexion 50% limited  Left lateral flexion 50% limited  Right rotation 25% limited  Left rotation 75% limited   (Blank rows = not tested) *= pain, L sided neck pain in UT region  LOWER EXTREMITY ROM:   WFL for tasks assessed  Active  Right eval Left eval  Hip flexion    Hip extension    Hip abduction    Hip adduction  Hip internal rotation    Hip external rotation    Knee flexion    Knee extension    Ankle dorsiflexion    Ankle plantarflexion    Ankle inversion    Ankle eversion     (Blank rows = not tested)  LOWER EXTREMITY MMT:    MMT Right eval Left eval  Hip flexion 4 4+  Hip extension    Hip abduction    Hip adduction    Hip internal rotation    Hip external rotation    Knee flexion 5 5  Knee extension 5 5  Ankle dorsiflexion 5 5  Ankle plantarflexion    Ankle inversion    Ankle eversion     (Blank rows = not tested)    FUNCTIONAL TESTS:  5 times sit to stand: 25.22 seconds without UE support for 3 reps, UE on thighs for 2 reps. Relies LLE>RLE 2 minute walk test: 195 feet  GAIT: Distance walked: 195 feet Assistive device utilized: None Level of assistance: Modified independence Comments: , trunk R lateral lean and  flexion, decreased stride length, decreased trunk/UE rotation  TODAY'S TREATMENT:                                                                                                                              DATE:  09/29/22 Review of goals Review of seated posture correction Seated: cervical exursions 5X each  Thoracic excursions with UE movements 5X each  Sit to stands no UE with mat beneath bottom 10X Supine:   abdominal isometric 10X5"  Bridge 10X  SLR 10X each  09/27/22 Correct seated posture x 10     PATIENT EDUCATION:  Education details: Patient educated on exam findings, POC, scope of PT, HEP, and posture/scoliosis. Person educated: Patient Education method: Explanation, Demonstration, and Handouts Education comprehension: verbalized understanding, returned demonstration, verbal cues required, and tactile cues required  HOME EXERCISE PROGRAM: Access Code: [email or web address] URL: [email or web address]  Date: 09/29/2022 Prepared by: Emeline Gins Exercises - Correct Seated Posture  - 3 x daily - 7 x weekly - 2 sets - 10 reps - Seated Scapular Retraction with External Rotation  - 2 x daily - 7 x weekly - 1 sets - 10 reps - 5 sec hold - Sit to Stand  - 2 x daily - 7 x weekly - 1 sets - 10 reps - Beginner Bridge  - 2 x daily - 7 x weekly - 1 sets - 10 reps - Supine Transversus Abdominis Bracing - Hands on Stomach  - 2 x daily - 7 x weekly - 1 sets - 10 reps - 5 sec hold - Small Range Straight Leg Raise  - 1 x daily - 7 x weekly - 1 sets - 10 reps  Date: 09/27/2022 - Correct Seated Posture  - 3 x daily - 7 x weekly - 2 sets - 10 reps  ASSESSMENT:  CLINICAL IMPRESSION: Reviewed goals and POC moving forward.  Began AROM, scapular and core stabilization exercises to POC today with overall good form and minimal discomfort.  Pt required cues to stabilize body when completing cervical motions.  Lateral flexion most limited followed by rotation.  Difficulty with  coordinating and  stabilizing with supine exercises.  Overall performed tasks as instructed without issues/discomfort.  Provided with additional copy of therex given at evaluation and updated HEP instructions today.   Patient will continue to benefit from skilled physical therapy in order to improve function and reduce impairment.  OBJECTIVE IMPAIRMENTS: Abnormal gait, decreased activity tolerance, decreased balance, decreased endurance, decreased mobility, difficulty walking, decreased ROM, decreased strength, hypomobility, increased muscle spasms, impaired flexibility, improper body mechanics, postural dysfunction, and pain.   ACTIVITY LIMITATIONS: carrying, lifting, bending, standing, squatting, stairs, transfers, reach over head, locomotion level, and caring for others  PARTICIPATION LIMITATIONS: meal prep, cleaning, laundry, shopping, community activity, and yard work  PERSONAL FACTORS: Age, Time since onset of injury/illness/exacerbation, and 3+ comorbidities:  hx LBP/surgery, hx breast cancer, DM, HTN, neck pain, chronic LBP, scoliosis  are also affecting patient's functional outcome.   REHAB POTENTIAL: Fair    CLINICAL DECISION MAKING: Stable/uncomplicated  EVALUATION COMPLEXITY: Low   GOALS: Goals reviewed with patient? Yes  SHORT TERM GOALS: Target date: 10/18/2022    Patient will be independent with HEP in order to improve functional outcomes. Baseline:  Goal status: INITIAL  2.  Patient will report at least 25% improvement in symptoms for improved quality of life. Baseline:  Goal status: INITIAL   LONG TERM GOALS: Target date: 11/08/2022    Patient will report at least 75% improvement in symptoms for improved quality of life. Baseline:  Goal status: INITIAL  2.  Patient will improve FOTO score by at least 10 points in order to indicate improved tolerance to activity. Baseline: 38% function Goal status: INITIAL  3.  Patient will demonstrate at least 25% improvement in thoracolumbar  and cerivcal ROM in all restricted planes for improved ability to move trunk and neck while completing chores. Baseline: see above Goal status: INITIAL  4.  Patient will be able to ambulate at least 250 feet in in order to demonstrate improved tolerance to activity. Baseline: 195 feet Goal status: INITIAL  5.  Patient will be able to complete 5x STS in under 11.4 seconds in order to reduce the risk of falls and demo improved LE strength for household activities. Baseline: 25.22 seconds without UE support for 3 reps, UE on thighs for 2 reps. Relies LLE>RLE Goal status: INITIAL     PLAN:  PT FREQUENCY: 2x/week  PT DURATION: 6 weeks  PLANNED INTERVENTIONS: Therapeutic exercises, Therapeutic activity, Neuromuscular re-education, Balance training, Gait training, Patient/Family education, Joint manipulation, Joint mobilization, Stair training, Orthotic/Fit training, DME instructions, Aquatic Therapy, Dry Needling, Electrical stimulation, Spinal manipulation, Spinal mobilization, Cryotherapy, Moist heat, Compression bandaging, scar mobilization, Splintting, Taping, Traction, Ultrasound, Ionotophoresis 4mg /ml Dexamethasone, and Manual therapy  PLAN FOR NEXT SESSION: core, hip, postural, and functional strengthening; spinal mobility, posture.  Update HEP as needed.  Lurena Nida, PTA/CLT [facility] Ph: [phone]  Lurena Nida, PTA 09/29/2022, 1:12 PM

## 2022-10-06 ENCOUNTER — Ambulatory Visit: Payer: Medicare Other | Admitting: Orthopaedic Surgery

## 2022-10-11 ENCOUNTER — Ambulatory Visit (HOSPITAL_COMMUNITY): Payer: Medicare Other

## 2022-10-11 ENCOUNTER — Encounter (HOSPITAL_COMMUNITY): Payer: Self-pay

## 2022-10-11 DIAGNOSIS — R2689 Other abnormalities of gait and mobility: Secondary | ICD-10-CM | POA: Diagnosis not present

## 2022-10-11 DIAGNOSIS — R293 Abnormal posture: Secondary | ICD-10-CM | POA: Diagnosis not present

## 2022-10-11 DIAGNOSIS — M542 Cervicalgia: Secondary | ICD-10-CM

## 2022-10-11 DIAGNOSIS — M5459 Other low back pain: Secondary | ICD-10-CM

## 2022-10-11 DIAGNOSIS — R29898 Other symptoms and signs involving the musculoskeletal system: Secondary | ICD-10-CM | POA: Diagnosis not present

## 2022-10-11 DIAGNOSIS — M5412 Radiculopathy, cervical region: Secondary | ICD-10-CM

## 2022-10-11 DIAGNOSIS — M6281 Muscle weakness (generalized): Secondary | ICD-10-CM | POA: Diagnosis not present

## 2022-10-11 NOTE — Therapy (Addendum)
OUTPATIENT PHYSICAL THERAPY TREATMENT  Patient Name: Christine Sutton MRN: [id number] DOB:1930/02/02, 89 y.o., female Today's Date: 10/11/2022  END OF SESSION:    10/11/22 1425  PT Visits / Re-Eval  Visit Number 3  Number of Visits 12  Date for PT Re-Evaluation 11/08/22  Authorization  Authorization Type Medicare/BCBS  Progress Note Due on Visit 19  PT Time Calculation  PT Start Time 1350  PT Stop Time 1428  PT Time Calculation (min) 38 min  PT - End of Session  Activity Tolerance Patient tolerated treatment well  Behavior During Therapy WFL for tasks assessed/performed    Past Medical History:  Diagnosis Date   Breast disorder    Diabetes mellitus (HCC)    Family history of breast cancer    Family history of colon cancer    Family history of lung cancer    High blood cholesterol level    HX: breast cancer    left   Hypertension    Invasive ductal carcinoma of left breast (HCC) 01/27/2014   ER +   Past Surgical History:  Procedure Laterality Date   BACK SURGERY     lower   CATARACT EXTRACTION, BILATERAL  11/2014   COLONOSCOPY WITH PROPOFOL N/A 01/18/2022   Procedure: COLONOSCOPY WITH PROPOFOL;  Surgeon: Dolores Frame, MD;  Location: [facility];  Service: Gastroenterology;  Laterality: N/A;   ESOPHAGOGASTRODUODENOSCOPY (EGD) WITH PROPOFOL N/A 01/18/2022   Procedure: ESOPHAGOGASTRODUODENOSCOPY (EGD) WITH PROPOFOL;  Surgeon: Dolores Frame, MD;  Location: [facility];  Service: Gastroenterology;  Laterality: N/A;   MASTECTOMY Left    SENTINEL NODE BIOPSY Right 05/15/2020   Procedure: SENTINEL NODE BIOPSY;  Surgeon: Lucretia Roers, MD;  Location: [facility];  Service: General;  Laterality: Right;   TOTAL MASTECTOMY Right 05/15/2020   Procedure: TOTAL MASTECTOMY RIGHT BREAST;  Surgeon: Lucretia Roers, MD;  Location: [facility];  Service: General;  Laterality: Right;   Patient Active Problem List   Diagnosis Date Noted   Blood loss anemia  01/16/2022   AKI (acute kidney injury) (HCC) 01/16/2022   GI bleed 01/15/2022   History of rectal bleeding 05/04/2021   Other spondylosis with radiculopathy, cervical region 01/14/2021   Foraminal stenosis of cervical region 01/14/2021   Family history of breast cancer    Family history of colon cancer    Family history of lung cancer    Genetic testing 06/28/2020   Breast cancer (HCC) 05/15/2020   Malignant neoplasm of upper-outer quadrant of right breast in female, estrogen receptor negative (HCC) 04/28/2020   History of breast cancer 10/18/2019   Pelvic pressure in female 10/18/2019   Left lower quadrant abdominal tenderness without rebound tenderness 10/18/2019   Vulvar itching 10/18/2019   Screening for colorectal cancer 10/18/2019   Invasive ductal carcinoma of left breast (HCC) 01/27/2014   Diabetes mellitus due to underlying condition without complications (HCC) 12/24/2013   Hypertension, essential, benign 12/24/2013    PCP: Carylon Perches MD  REFERRING PROVIDER: Eldred Manges, MD  REFERRING DIAG: M54.50,G89.29 (ICD-10-CM) - Chronic bilateral low back pain, unspecified whether sciatica present  Rationale for Evaluation and Treatment: Rehabilitation  THERAPY DIAG:  No diagnosis found.  ONSET DATE: chronic  SUBJECTIVE:  SUBJECTIVE STATEMENT: Pt stated she is feeling a lot better today.  Stated main problem is her neck and reports she has no pain in back while sitting.  Stated she has been compliant with HEP daily without questions.  Evaluation: Patient states neck and low back pain. She did some self traction at home with water device. Been using tens on neck and massage tool. Back doesn't bother her with sitting. Neck bothers her all the time. Had back surgery and was alright for a while. PT  helped her last time she was in. She eventually stopped doing HEP provided. Can't sit very long due to symptoms. Patient states low back pain and symptoms into R leg burning to mid calf region. Tylenol and gabapentin seem to help and lidocaine. Wants to work on her neck and back issues. Hasn't been able to sleep in bed in over a year until recently getting an adjustable bed and has been sleeping in it for a week.  PERTINENT HISTORY:  Hx of interspinous device at L5-S1 later fusion at L4-5 with pedicle screws on the right side only, hx breast cancer, DM, HTN, neck pain, chronic LBP  PAIN:  Are you having pain? Yes: NPRS scale: 5/10 Pain location: low back and neck Pain description: burning Aggravating factors: sitting Relieving factors: Tylenol and gabapentin, lidocaine  PRECAUTIONS: Fall  WEIGHT BEARING RESTRICTIONS: No  FALLS:  Has patient fallen in last 6 months? No  OCCUPATION: Retired  PLOF: Independent  PATIENT GOALS: to get better  NEXT MD VISIT: next week  OBJECTIVE:   OBSERVATION: lateral lean to R in sitting most of session   DIAGNOSTIC FINDINGS:  XR 09/08/22 [facility] lateral lumbar spine images are obtained and reviewed this shows right  pedicle instrumentation with cage at L4-5.  Interspinous device L5-S1.   Scoliosis curve approximately 30 degrees right lumbar apex at L2-3  junction.  Considerable facet arthropathy noted.  No compression  fractures.  Endplate sclerosis at L2-3 L3-4.   Impression: Previous fusion as described above.  Scoliosis with  degenerative changes.   PATIENT SURVEYS: FOTO 38% function  SCREENING FOR RED FLAGS: Bowel or bladder incontinence: No Spinal tumors: No Cauda equina syndrome: No Compression fracture: No Abdominal aneurysm: No  COGNITION: Overall cognitive status: Within functional limits for tasks assessed     SENSATION: WFL  POSTURE: rounded shoulders, forward head, and R lateral lean  and trunk flexion due to   scoliosis  PALPATION: TTP L cervical paraspinals, R lumbar paraspinals, glutes  LUMBAR ROM:   AROM eval  Flexion 0% limited *  Extension 75% limited  Right lateral flexion 25% limited  Left lateral flexion 50% limited  Right rotation 90% limited  Left rotation 90%limited   (Blank rows = not tested) *= pain, rotation mostly through shoulders/thoracic spine minimal thoraco/lumbar Cervical  ROM:   AROM eval  Flexion 0% limited *  Extension 25% limited  Right lateral flexion 50% limited  Left lateral flexion 50% limited  Right rotation 25% limited  Left rotation 75% limited   (Blank rows = not tested) *= pain, L sided neck pain in UT region  LOWER EXTREMITY ROM:   WFL for tasks assessed  Active  Right eval Left eval  Hip flexion    Hip extension    Hip abduction    Hip adduction    Hip internal rotation    Hip external rotation    Knee flexion    Knee extension    Ankle dorsiflexion  Ankle plantarflexion    Ankle inversion    Ankle eversion     (Blank rows = not tested)  LOWER EXTREMITY MMT:    MMT Right eval Left eval  Hip flexion 4 4+  Hip extension    Hip abduction    Hip adduction    Hip internal rotation    Hip external rotation    Knee flexion 5 5  Knee extension 5 5  Ankle dorsiflexion 5 5  Ankle plantarflexion    Ankle inversion    Ankle eversion     (Blank rows = not tested)    FUNCTIONAL TESTS:  5 times sit to stand: 25.22 seconds without UE support for 3 reps, UE on thighs for 2 reps. Relies LLE>RLE 2 minute walk test: 195 feet  GAIT: Distance walked: 195 feet Assistive device utilized: None Level of assistance: Modified independence Comments: , trunk R lateral lean and flexion, decreased stride length, decreased trunk/UE rotation  TODAY'S TREATMENT:                                                                                                                              DATE:  10/11/22: STS 10x  Seated: cervical  retraction 10x with verbal/tactile cueing Scapular retraction 5x RTB rows 10x Supine: Instructed log rolling mechanics Decompression 2-5 5x 3" Bridge Instructed Rolling Sidelying: Clam   09/29/22 Review of goals Review of seated posture correction Seated: cervical exursions 5X each  Thoracic excursions with UE movements 5X each  Sit to stands no UE with mat beneath bottom 10X Supine:   abdominal isometric 10X5"  Bridge 10X  SLR 10X each  09/27/22 Correct seated posture x 10     PATIENT EDUCATION:  Education details: Patient educated on exam findings, POC, scope of PT, HEP, and posture/scoliosis. Person educated: Patient Education method: Explanation, Demonstration, and Handouts Education comprehension: verbalized understanding, returned demonstration, verbal cues required, and tactile cues required  HOME EXERCISE PROGRAM: Access Code: [email or web address] URL: [email or web address]  10/11/22:  decompression  Date: 09/29/2022 Prepared by: Emeline Gins Exercises - Correct Seated Posture  - 3 x daily - 7 x weekly - 2 sets - 10 reps - Seated Scapular Retraction with External Rotation  - 2 x daily - 7 x weekly - 1 sets - 10 reps - 5 sec hold - Sit to Stand  - 2 x daily - 7 x weekly - 1 sets - 10 reps - Beginner Bridge  - 2 x daily - 7 x weekly - 1 sets - 10 reps - Supine Transversus Abdominis Bracing - Hands on Stomach  - 2 x daily - 7 x weekly - 1 sets - 10 reps - 5 sec hold - Small Range Straight Leg Raise  - 1 x daily - 7 x weekly - 1 sets - 10 reps  Date: 09/27/2022 - Correct Seated Posture  - 3 x daily - 7 x weekly - 2 sets - 10 reps  ASSESSMENT:  CLINICAL IMPRESSION: Pt educated importance of seated posture for pain control with additional postural strengthening exercises.  Added decompression exercises for posterior chain strengthening.  Cueing required for proper mechanics in seated position, improved with supine position.  Also instructed log rolling and rolling  to side mechanics to reduce stress on back.  OBJECTIVE IMPAIRMENTS: Abnormal gait, decreased activity tolerance, decreased balance, decreased endurance, decreased mobility, difficulty walking, decreased ROM, decreased strength, hypomobility, increased muscle spasms, impaired flexibility, improper body mechanics, postural dysfunction, and pain.   ACTIVITY LIMITATIONS: carrying, lifting, bending, standing, squatting, stairs, transfers, reach over head, locomotion level, and caring for others  PARTICIPATION LIMITATIONS: meal prep, cleaning, laundry, shopping, community activity, and yard work  PERSONAL FACTORS: Age, Time since onset of injury/illness/exacerbation, and 3+ comorbidities:  hx LBP/surgery, hx breast cancer, DM, HTN, neck pain, chronic LBP, scoliosis  are also affecting patient's functional outcome.   REHAB POTENTIAL: Fair    CLINICAL DECISION MAKING: Stable/uncomplicated  EVALUATION COMPLEXITY: Low   GOALS: Goals reviewed with patient? Yes  SHORT TERM GOALS: Target date: 10/18/2022    Patient will be independent with HEP in order to improve functional outcomes. Baseline:  Goal status: IN PROGRESS  2.  Patient will report at least 25% improvement in symptoms for improved quality of life. Baseline:  Goal status: IN PROGRESS   LONG TERM GOALS: Target date: 11/08/2022    Patient will report at least 75% improvement in symptoms for improved quality of life. Baseline:  Goal status: IN PROGRESS  2.  Patient will improve FOTO score by at least 10 points in order to indicate improved tolerance to activity. Baseline: 38% function Goal status: IN PROGRESS  3.  Patient will demonstrate at least 25% improvement in thoracolumbar and cerivcal ROM in all restricted planes for improved ability to move trunk and neck while completing chores. Baseline: see above Goal status: IN PROGRESS  4.  Patient will be able to ambulate at least 250 feet in in order to demonstrate improved  tolerance to activity. Baseline: 195 feet Goal status: IN PROGRESS  5.  Patient will be able to complete 5x STS in under 11.4 seconds in order to reduce the risk of falls and demo improved LE strength for household activities. Baseline: 25.22 seconds without UE support for 3 reps, UE on thighs for 2 reps. Relies LLE>RLE Goal status: IN PROGRESS     PLAN:  PT FREQUENCY: 2x/week  PT DURATION: 6 weeks  PLANNED INTERVENTIONS: Therapeutic exercises, Therapeutic activity, Neuromuscular re-education, Balance training, Gait training, Patient/Family education, Joint manipulation, Joint mobilization, Stair training, Orthotic/Fit training, DME instructions, Aquatic Therapy, Dry Needling, Electrical stimulation, Spinal manipulation, Spinal mobilization, Cryotherapy, Moist heat, Compression bandaging, scar mobilization, Splintting, Taping, Traction, Ultrasound, Ionotophoresis 4mg /ml Dexamethasone, and Manual therapy  PLAN FOR NEXT SESSION: core, hip, postural, and functional strengthening; spinal mobility, posture.  Update HEP as needed.   Becky Sax, LPTA/CLT; CBIS [phone]  Juel Burrow, PTA 10/11/2022, 1:48 PM

## 2022-10-17 ENCOUNTER — Ambulatory Visit (HOSPITAL_COMMUNITY): Payer: Medicare Other | Attending: Orthopaedic Surgery | Admitting: Physical Therapy

## 2022-10-17 DIAGNOSIS — M542 Cervicalgia: Secondary | ICD-10-CM | POA: Insufficient documentation

## 2022-10-17 DIAGNOSIS — M5412 Radiculopathy, cervical region: Secondary | ICD-10-CM | POA: Insufficient documentation

## 2022-10-17 DIAGNOSIS — R293 Abnormal posture: Secondary | ICD-10-CM | POA: Insufficient documentation

## 2022-10-17 DIAGNOSIS — R29898 Other symptoms and signs involving the musculoskeletal system: Secondary | ICD-10-CM | POA: Diagnosis not present

## 2022-10-17 DIAGNOSIS — M5459 Other low back pain: Secondary | ICD-10-CM | POA: Insufficient documentation

## 2022-10-17 DIAGNOSIS — R2689 Other abnormalities of gait and mobility: Secondary | ICD-10-CM | POA: Diagnosis not present

## 2022-10-17 DIAGNOSIS — M6281 Muscle weakness (generalized): Secondary | ICD-10-CM | POA: Insufficient documentation

## 2022-10-17 NOTE — Therapy (Signed)
OUTPATIENT PHYSICAL THERAPY TREATMENT  Patient Name: Christine Sutton MRN: [id number] DOB:1930/04/21, 89 y.o., female Today's Date: 10/17/2022  END OF SESSION:  PT End of Session - 10/17/22 1037     Visit Number 4    Number of Visits 12    Date for PT Re-Evaluation 11/08/22    Authorization Type Medicare/BCBS    Progress Note Due on Visit 19    PT Start Time 1034    PT Stop Time 1115    PT Time Calculation (min) 41 min    Activity Tolerance Patient tolerated treatment well    Behavior During Therapy WFL for tasks assessed/performed              Past Medical History:  Diagnosis Date   Breast disorder    Diabetes mellitus (HCC)    Family history of breast cancer    Family history of colon cancer    Family history of lung cancer    High blood cholesterol level    HX: breast cancer    left   Hypertension    Invasive ductal carcinoma of left breast (HCC) 01/27/2014   ER +   Past Surgical History:  Procedure Laterality Date   BACK SURGERY     lower   CATARACT EXTRACTION, BILATERAL  11/2014   COLONOSCOPY WITH PROPOFOL N/A 01/18/2022   Procedure: COLONOSCOPY WITH PROPOFOL;  Surgeon: Dolores Frame, MD;  Location: [facility];  Service: Gastroenterology;  Laterality: N/A;   ESOPHAGOGASTRODUODENOSCOPY (EGD) WITH PROPOFOL N/A 01/18/2022   Procedure: ESOPHAGOGASTRODUODENOSCOPY (EGD) WITH PROPOFOL;  Surgeon: Dolores Frame, MD;  Location: [facility];  Service: Gastroenterology;  Laterality: N/A;   MASTECTOMY Left    SENTINEL NODE BIOPSY Right 05/15/2020   Procedure: SENTINEL NODE BIOPSY;  Surgeon: Lucretia Roers, MD;  Location: [facility];  Service: General;  Laterality: Right;   TOTAL MASTECTOMY Right 05/15/2020   Procedure: TOTAL MASTECTOMY RIGHT BREAST;  Surgeon: Lucretia Roers, MD;  Location: [facility];  Service: General;  Laterality: Right;   Patient Active Problem List   Diagnosis Date Noted   Blood loss anemia 01/16/2022   AKI (acute kidney  injury) (HCC) 01/16/2022   GI bleed 01/15/2022   History of rectal bleeding 05/04/2021   Other spondylosis with radiculopathy, cervical region 01/14/2021   Foraminal stenosis of cervical region 01/14/2021   Family history of breast cancer    Family history of colon cancer    Family history of lung cancer    Genetic testing 06/28/2020   Breast cancer (HCC) 05/15/2020   Malignant neoplasm of upper-outer quadrant of right breast in female, estrogen receptor negative (HCC) 04/28/2020   History of breast cancer 10/18/2019   Pelvic pressure in female 10/18/2019   Left lower quadrant abdominal tenderness without rebound tenderness 10/18/2019   Vulvar itching 10/18/2019   Screening for colorectal cancer 10/18/2019   Invasive ductal carcinoma of left breast (HCC) 01/27/2014   Diabetes mellitus due to underlying condition without complications (HCC) 12/24/2013   Hypertension, essential, benign 12/24/2013    PCP: Carylon Perches MD  REFERRING PROVIDER: Eldred Manges, MD  REFERRING DIAG: M54.50,G89.29 (ICD-10-CM) - Chronic bilateral low back pain, unspecified whether sciatica present  Rationale for Evaluation and Treatment: Rehabilitation  THERAPY DIAG:  Other low back pain  Other abnormalities of gait and mobility  Other symptoms and signs involving the musculoskeletal system  Radiculopathy, cervical region  Abnormal posture  ONSET DATE: chronic  SUBJECTIVE:  SUBJECTIVE STATEMENT: Pt states her neck is really hurting today at 8/10.  Thinks it may be due to the weather (rain coming).  d she is feeling a lot better today.  Stated main problem is her neck and reports she has no pain in back while sitting.  Stated she has been compliant with HEP daily without questions.  Evaluation: Patient states neck and  low back pain. She did some self traction at home with water device. Been using tens on neck and massage tool. Back doesn't bother her with sitting. Neck bothers her all the time. Had back surgery and was alright for a while. PT helped her last time she was in. She eventually stopped doing HEP provided. Can't sit very long due to symptoms. Patient states low back pain and symptoms into R leg burning to mid calf region. Tylenol and gabapentin seem to help and lidocaine. Wants to work on her neck and back issues. Hasn't been able to sleep in bed in over a year until recently getting an adjustable bed and has been sleeping in it for a week.  PERTINENT HISTORY:  Hx of interspinous device at L5-S1 later fusion at L4-5 with pedicle screws on the right side only, hx breast cancer, DM, HTN, neck pain, chronic LBP  PAIN:  Are you having pain? Yes: NPRS scale: 5/10 Pain location: low back and neck Pain description: burning Aggravating factors: sitting Relieving factors: Tylenol and gabapentin, lidocaine  PRECAUTIONS: Fall  WEIGHT BEARING RESTRICTIONS: No  FALLS:  Has patient fallen in last 6 months? No  OCCUPATION: Retired  PLOF: Independent  PATIENT GOALS: to get better  NEXT MD VISIT: next week  OBJECTIVE:   OBSERVATION: lateral lean to R in sitting most of session   DIAGNOSTIC FINDINGS:  XR 09/08/22 [facility] lateral lumbar spine images are obtained and reviewed this shows right  pedicle instrumentation with cage at L4-5.  Interspinous device L5-S1.   Scoliosis curve approximately 30 degrees right lumbar apex at L2-3  junction.  Considerable facet arthropathy noted.  No compression  fractures.  Endplate sclerosis at L2-3 L3-4.   Impression: Previous fusion as described above.  Scoliosis with  degenerative changes.   PATIENT SURVEYS: FOTO 38% function  SCREENING FOR RED FLAGS: Bowel or bladder incontinence: No Spinal tumors: No Cauda equina syndrome: No Compression fracture:  No Abdominal aneurysm: No  COGNITION: Overall cognitive status: Within functional limits for tasks assessed     SENSATION: WFL  POSTURE: rounded shoulders, forward head, and R lateral lean  and trunk flexion due to  scoliosis  PALPATION: TTP L cervical paraspinals, R lumbar paraspinals, glutes  LUMBAR ROM:   AROM eval  Flexion 0% limited *  Extension 75% limited  Right lateral flexion 25% limited  Left lateral flexion 50% limited  Right rotation 90% limited  Left rotation 90%limited   (Blank rows = not tested) *= pain, rotation mostly through shoulders/thoracic spine minimal thoraco/lumbar Cervical  ROM:   AROM eval  Flexion 0% limited *  Extension 25% limited  Right lateral flexion 50% limited  Left lateral flexion 50% limited  Right rotation 25% limited  Left rotation 75% limited   (Blank rows = not tested) *= pain, L sided neck pain in UT region  LOWER EXTREMITY ROM:   WFL for tasks assessed  Active  Right eval Left eval  Hip flexion    Hip extension    Hip abduction    Hip adduction    Hip internal rotation  Hip external rotation    Knee flexion    Knee extension    Ankle dorsiflexion    Ankle plantarflexion    Ankle inversion    Ankle eversion     (Blank rows = not tested)  LOWER EXTREMITY MMT:    MMT Right eval Left eval  Hip flexion 4 4+  Hip extension    Hip abduction    Hip adduction    Hip internal rotation    Hip external rotation    Knee flexion 5 5  Knee extension 5 5  Ankle dorsiflexion 5 5  Ankle plantarflexion    Ankle inversion    Ankle eversion     (Blank rows = not tested)    FUNCTIONAL TESTS:  5 times sit to stand: 25.22 seconds without UE support for 3 reps, UE on thighs for 2 reps. Relies LLE>RLE 2 minute walk test: 195 feet  GAIT: Distance walked: 195 feet Assistive device utilized: None Level of assistance: Modified independence Comments: , trunk R lateral lean and flexion, decreased stride length,  decreased trunk/UE rotation  TODAY'S TREATMENT:                                                                                                                              DATE:  10/16/21 Supine: log roll to/from supine Decompression 2-5 10x 3" Decompression with RTB 5X each Seated:  cervical retraction 10X5"  Scapular retraction 10X5"  10/11/22: STS 10x  Seated: cervical retraction 10x with verbal/tactile cueing Scapular retraction 5x RTB rows 10x Supine: Instructed log rolling mechanics Decompression 2-5 5x 3" Bridge Instructed Rolling Sidelying: Clam   09/29/22 Review of goals Review of seated posture correction Seated: cervical exursions 5X each  Thoracic excursions with UE movements 5X each  Sit to stands no UE with mat beneath bottom 10X Supine:   abdominal isometric 10X5"  Bridge 10X  SLR 10X each  09/27/22 Correct seated posture x 10     PATIENT EDUCATION:  Education details: Patient educated on exam findings, POC, scope of PT, HEP, and posture/scoliosis. Person educated: Patient Education method: Explanation, Demonstration, and Handouts Education comprehension: verbalized understanding, returned demonstration, verbal cues required, and tactile cues required  HOME EXERCISE PROGRAM: Access Code: [email or web address] URL: [email or web address]  10/11/22:  decompression  Date: 09/29/2022 Prepared by: Emeline Gins Exercises - Correct Seated Posture  - 3 x daily - 7 x weekly - 2 sets - 10 reps - Seated Scapular Retraction with External Rotation  - 2 x daily - 7 x weekly - 1 sets - 10 reps - 5 sec hold - Sit to Stand  - 2 x daily - 7 x weekly - 1 sets - 10 reps - Beginner Bridge  - 2 x daily - 7 x weekly - 1 sets - 10 reps - Supine Transversus Abdominis Bracing - Hands on Stomach  - 2 x daily - 7 x weekly - 1 sets - 10 reps - 5 sec  hold - Small Range Straight Leg Raise  - 1 x daily - 7 x weekly - 1 sets - 10 reps  Date: 09/27/2022 - Correct Seated Posture   - 3 x daily - 7 x weekly - 2 sets - 10 reps  ASSESSMENT:  CLINICAL IMPRESSION: Continued with focus on improving core and postural stabilization.  Instructed with decompression thera band exercises with manual cues for form.  Increased reps of decompression 2-4 as well. Pt is compliant with HEP and presents with improved seated posturing during session today. Unable to recall log rolling in correct form with verbal and tactile cues to complete correctly. No increase or decrease in pain at end of session today.  Pt will continue to benefit from skilled therapy to improve deficits and reduce pain.  OBJECTIVE IMPAIRMENTS: Abnormal gait, decreased activity tolerance, decreased balance, decreased endurance, decreased mobility, difficulty walking, decreased ROM, decreased strength, hypomobility, increased muscle spasms, impaired flexibility, improper body mechanics, postural dysfunction, and pain.   ACTIVITY LIMITATIONS: carrying, lifting, bending, standing, squatting, stairs, transfers, reach over head, locomotion level, and caring for others  PARTICIPATION LIMITATIONS: meal prep, cleaning, laundry, shopping, community activity, and yard work  PERSONAL FACTORS: Age, Time since onset of injury/illness/exacerbation, and 3+ comorbidities:  hx LBP/surgery, hx breast cancer, DM, HTN, neck pain, chronic LBP, scoliosis  are also affecting patient's functional outcome.   REHAB POTENTIAL: Fair    CLINICAL DECISION MAKING: Stable/uncomplicated  EVALUATION COMPLEXITY: Low   GOALS: Goals reviewed with patient? Yes  SHORT TERM GOALS: Target date: 10/18/2022    Patient will be independent with HEP in order to improve functional outcomes. Baseline:  Goal status: IN PROGRESS  2.  Patient will report at least 25% improvement in symptoms for improved quality of life. Baseline:  Goal status: IN PROGRESS   LONG TERM GOALS: Target date: 11/08/2022    Patient will report at least 75% improvement in  symptoms for improved quality of life. Baseline:  Goal status: IN PROGRESS  2.  Patient will improve FOTO score by at least 10 points in order to indicate improved tolerance to activity. Baseline: 38% function Goal status: IN PROGRESS  3.  Patient will demonstrate at least 25% improvement in thoracolumbar and cerivcal ROM in all restricted planes for improved ability to move trunk and neck while completing chores. Baseline: see above Goal status: IN PROGRESS  4.  Patient will be able to ambulate at least 250 feet in in order to demonstrate improved tolerance to activity. Baseline: 195 feet Goal status: IN PROGRESS  5.  Patient will be able to complete 5x STS in under 11.4 seconds in order to reduce the risk of falls and demo improved LE strength for household activities. Baseline: 25.22 seconds without UE support for 3 reps, UE on thighs for 2 reps. Relies LLE>RLE Goal status: IN PROGRESS     PLAN:  PT FREQUENCY: 2x/week  PT DURATION: 6 weeks  PLANNED INTERVENTIONS: Therapeutic exercises, Therapeutic activity, Neuromuscular re-education, Balance training, Gait training, Patient/Family education, Joint manipulation, Joint mobilization, Stair training, Orthotic/Fit training, DME instructions, Aquatic Therapy, Dry Needling, Electrical stimulation, Spinal manipulation, Spinal mobilization, Cryotherapy, Moist heat, Compression bandaging, scar mobilization, Splintting, Taping, Traction, Ultrasound, Ionotophoresis 4mg /ml Dexamethasone, and Manual therapy  PLAN FOR NEXT SESSION: core, hip, postural, and functional strengthening; spinal mobility, posture.  Update HEP as needed.   Emeline Gins B, PTA 10/17/2022, 2:31 PM

## 2022-10-19 ENCOUNTER — Ambulatory Visit (HOSPITAL_COMMUNITY): Payer: Medicare Other | Admitting: Physical Therapy

## 2022-10-19 DIAGNOSIS — R293 Abnormal posture: Secondary | ICD-10-CM | POA: Diagnosis not present

## 2022-10-19 DIAGNOSIS — R29898 Other symptoms and signs involving the musculoskeletal system: Secondary | ICD-10-CM | POA: Diagnosis not present

## 2022-10-19 DIAGNOSIS — R2689 Other abnormalities of gait and mobility: Secondary | ICD-10-CM

## 2022-10-19 DIAGNOSIS — M5412 Radiculopathy, cervical region: Secondary | ICD-10-CM | POA: Diagnosis not present

## 2022-10-19 DIAGNOSIS — M5459 Other low back pain: Secondary | ICD-10-CM | POA: Diagnosis not present

## 2022-10-19 DIAGNOSIS — M6281 Muscle weakness (generalized): Secondary | ICD-10-CM | POA: Diagnosis not present

## 2022-10-19 NOTE — Therapy (Signed)
OUTPATIENT PHYSICAL THERAPY TREATMENT  Patient Name: Christine Sutton MRN: [id number] DOB:10-May-1930, 89 y.o., female Today's Date: 10/19/2022  END OF SESSION:  PT End of Session - 10/19/22 1040     Visit Number 5    Number of Visits 12    Date for PT Re-Evaluation 11/08/22    Authorization Type Medicare/BCBS    Progress Note Due on Visit 19    PT Start Time [phone]    PT Stop Time 1032    PT Time Calculation (min) 43 min    Activity Tolerance Patient tolerated treatment well    Behavior During Therapy WFL for tasks assessed/performed               Past Medical History:  Diagnosis Date   Breast disorder    Diabetes mellitus (HCC)    Family history of breast cancer    Family history of colon cancer    Family history of lung cancer    High blood cholesterol level    HX: breast cancer    left   Hypertension    Invasive ductal carcinoma of left breast (HCC) 01/27/2014   ER +   Past Surgical History:  Procedure Laterality Date   BACK SURGERY     lower   CATARACT EXTRACTION, BILATERAL  11/2014   COLONOSCOPY WITH PROPOFOL N/A 01/18/2022   Procedure: COLONOSCOPY WITH PROPOFOL;  Surgeon: Dolores Frame, MD;  Location: [facility];  Service: Gastroenterology;  Laterality: N/A;   ESOPHAGOGASTRODUODENOSCOPY (EGD) WITH PROPOFOL N/A 01/18/2022   Procedure: ESOPHAGOGASTRODUODENOSCOPY (EGD) WITH PROPOFOL;  Surgeon: Dolores Frame, MD;  Location: [facility];  Service: Gastroenterology;  Laterality: N/A;   MASTECTOMY Left    SENTINEL NODE BIOPSY Right 05/15/2020   Procedure: SENTINEL NODE BIOPSY;  Surgeon: Lucretia Roers, MD;  Location: [facility];  Service: General;  Laterality: Right;   TOTAL MASTECTOMY Right 05/15/2020   Procedure: TOTAL MASTECTOMY RIGHT BREAST;  Surgeon: Lucretia Roers, MD;  Location: [facility];  Service: General;  Laterality: Right;   Patient Active Problem List   Diagnosis Date Noted   Blood loss anemia 01/16/2022   AKI (acute kidney  injury) (HCC) 01/16/2022   GI bleed 01/15/2022   History of rectal bleeding 05/04/2021   Other spondylosis with radiculopathy, cervical region 01/14/2021   Foraminal stenosis of cervical region 01/14/2021   Family history of breast cancer    Family history of colon cancer    Family history of lung cancer    Genetic testing 06/28/2020   Breast cancer (HCC) 05/15/2020   Malignant neoplasm of upper-outer quadrant of right breast in female, estrogen receptor negative (HCC) 04/28/2020   History of breast cancer 10/18/2019   Pelvic pressure in female 10/18/2019   Left lower quadrant abdominal tenderness without rebound tenderness 10/18/2019   Vulvar itching 10/18/2019   Screening for colorectal cancer 10/18/2019   Invasive ductal carcinoma of left breast (HCC) 01/27/2014   Diabetes mellitus due to underlying condition without complications (HCC) 12/24/2013   Hypertension, essential, benign 12/24/2013    PCP: Carylon Perches MD  REFERRING PROVIDER: Eldred Manges, MD  REFERRING DIAG: M54.50,G89.29 (ICD-10-CM) - Chronic bilateral low back pain, unspecified whether sciatica present  Rationale for Evaluation and Treatment: Rehabilitation  THERAPY DIAG:  Other low back pain  Other abnormalities of gait and mobility  ONSET DATE: chronic  SUBJECTIVE:  SUBJECTIVE STATEMENT: Pt states her neck continues to hurt her badly staying at 8/10. She states she also doesn't feel as strong either.   Evaluation: Patient states neck and low back pain. She did some self traction at home with water device. Been using tens on neck and massage tool. Back doesn't bother her with sitting. Neck bothers her all the time. Had back surgery and was alright for a while. PT helped her last time she was in. She eventually stopped doing HEP  provided. Can't sit very long due to symptoms. Patient states low back pain and symptoms into R leg burning to mid calf region. Tylenol and gabapentin seem to help and lidocaine. Wants to work on her neck and back issues. Hasn't been able to sleep in bed in over a year until recently getting an adjustable bed and has been sleeping in it for a week.  PERTINENT HISTORY:  Hx of interspinous device at L5-S1 later fusion at L4-5 with pedicle screws on the right side only, hx breast cancer, DM, HTN, neck pain, chronic LBP  PAIN:  Are you having pain? Yes: NPRS scale: 5/10 Pain location: low back and neck Pain description: burning Aggravating factors: sitting Relieving factors: Tylenol and gabapentin, lidocaine  PRECAUTIONS: Fall  WEIGHT BEARING RESTRICTIONS: No  FALLS:  Has patient fallen in last 6 months? No  OCCUPATION: Retired  PLOF: Independent  PATIENT GOALS: to get better  NEXT MD VISIT: next week  OBJECTIVE:   OBSERVATION: lateral lean to R in sitting most of session   DIAGNOSTIC FINDINGS:  XR 09/08/22 [facility] lateral lumbar spine images are obtained and reviewed this shows right  pedicle instrumentation with cage at L4-5.  Interspinous device L5-S1.   Scoliosis curve approximately 30 degrees right lumbar apex at L2-3  junction.  Considerable facet arthropathy noted.  No compression  fractures.  Endplate sclerosis at L2-3 L3-4.   Impression: Previous fusion as described above.  Scoliosis with  degenerative changes.   PATIENT SURVEYS: FOTO 38% function  SCREENING FOR RED FLAGS: Bowel or bladder incontinence: No Spinal tumors: No Cauda equina syndrome: No Compression fracture: No Abdominal aneurysm: No  COGNITION: Overall cognitive status: Within functional limits for tasks assessed     SENSATION: WFL  POSTURE: rounded shoulders, forward head, and R lateral lean  and trunk flexion due to  scoliosis  PALPATION: TTP L cervical paraspinals, R lumbar paraspinals,  glutes  LUMBAR ROM:   AROM eval  Flexion 0% limited *  Extension 75% limited  Right lateral flexion 25% limited  Left lateral flexion 50% limited  Right rotation 90% limited  Left rotation 90%limited   (Blank rows = not tested) *= pain, rotation mostly through shoulders/thoracic spine minimal thoraco/lumbar Cervical  ROM:   AROM eval  Flexion 0% limited *  Extension 25% limited  Right lateral flexion 50% limited  Left lateral flexion 50% limited  Right rotation 25% limited  Left rotation 75% limited   (Blank rows = not tested) *= pain, L sided neck pain in UT region  LOWER EXTREMITY ROM:   WFL for tasks assessed  Active  Right eval Left eval  Hip flexion    Hip extension    Hip abduction    Hip adduction    Hip internal rotation    Hip external rotation    Knee flexion    Knee extension    Ankle dorsiflexion    Ankle plantarflexion    Ankle inversion    Ankle eversion     (  Blank rows = not tested)  LOWER EXTREMITY MMT:    MMT Right eval Left eval  Hip flexion 4 4+  Hip extension    Hip abduction    Hip adduction    Hip internal rotation    Hip external rotation    Knee flexion 5 5  Knee extension 5 5  Ankle dorsiflexion 5 5  Ankle plantarflexion    Ankle inversion    Ankle eversion     (Blank rows = not tested)    FUNCTIONAL TESTS:  5 times sit to stand: 25.22 seconds without UE support for 3 reps, UE on thighs for 2 reps. Relies LLE>RLE 2 minute walk test: 195 feet  GAIT: Distance walked: 195 feet Assistive device utilized: None Level of assistance: Modified independence Comments: , trunk R lateral lean and flexion, decreased stride length, decreased trunk/UE rotation  TODAY'S TREATMENT:                                                                                                                              DATE:  10/18/21 Supine: log roll to/from supine Decompression 2-5 10x 3" Decompression with RTB 5X each Seated:  cervical  retraction 10X5"  Scapular retraction 10X5 Standing:  Red theraband scap retr 10X  RTB rows 10X  RTB extensions 10X  10/16/21 Supine: log roll to/from supine Decompression 2-5 10x 3" Decompression with RTB 5X each Seated:  cervical retraction 10X5"  Scapular retraction 10X5"  10/11/22: STS 10x  Seated: cervical retraction 10x with verbal/tactile cueing Scapular retraction 5x RTB rows 10x Supine: Instructed log rolling mechanics Decompression 2-5 5x 3" Bridge Instructed Rolling Sidelying: Clam   09/29/22 Review of goals Review of seated posture correction Seated: cervical exursions 5X each  Thoracic excursions with UE movements 5X each  Sit to stands no UE with mat beneath bottom 10X Supine:   abdominal isometric 10X5"  Bridge 10X  SLR 10X each  09/27/22 Correct seated posture x 10     PATIENT EDUCATION:  Education details: Patient educated on exam findings, POC, scope of PT, HEP, and posture/scoliosis. Person educated: Patient Education method: Explanation, Demonstration, and Handouts Education comprehension: verbalized understanding, returned demonstration, verbal cues required, and tactile cues required  HOME EXERCISE PROGRAM: Access Code: [email or web address] URL: [email or web address]  Date: 10/19/2022 - Scapular Retraction with Resistance  - 2 x daily - 7 x weekly - 2 sets - 10 reps - Scapular Retraction with Resistance Advanced  - 2 x daily - 7 x weekly - 2 sets - 10 reps Decompression 1-4 with theraband  10/11/22:  decompression  Date: 09/29/2022 Prepared by: Emeline Gins Exercises - Correct Seated Posture  - 3 x daily - 7 x weekly - 2 sets - 10 reps - Seated Scapular Retraction with External Rotation  - 2 x daily - 7 x weekly - 1 sets - 10 reps - 5 sec hold - Sit to Stand  - 2 x daily - 7 x weekly -  1 sets - 10 reps - Beginner Bridge  - 2 x daily - 7 x weekly - 1 sets - 10 reps - Supine Transversus Abdominis Bracing - Hands on Stomach  - 2 x daily -  7 x weekly - 1 sets - 10 reps - 5 sec hold - Small Range Straight Leg Raise  - 1 x daily - 7 x weekly - 1 sets - 10 reps  Date: 09/27/2022 - Correct Seated Posture  - 3 x daily - 7 x weekly - 2 sets - 10 reps  ASSESSMENT:  CLINICAL IMPRESSION: Pt still with some level of difficulty recalling therex and forgetful of technique midway through needing redirection.  Began standing postural strengthening this session as tends to have more pain related to postural fatigue.  Pt given written instructions for postural tband and decompression with tband this session.  Demonstrates log rolling in correct from supine to sit but tends to not use method to get to supine position. No increase or decrease in pain at end of session today.  Pt will continue to benefit from skilled therapy to improve deficits and reduce pain.  OBJECTIVE IMPAIRMENTS: Abnormal gait, decreased activity tolerance, decreased balance, decreased endurance, decreased mobility, difficulty walking, decreased ROM, decreased strength, hypomobility, increased muscle spasms, impaired flexibility, improper body mechanics, postural dysfunction, and pain.   ACTIVITY LIMITATIONS: carrying, lifting, bending, standing, squatting, stairs, transfers, reach over head, locomotion level, and caring for others  PARTICIPATION LIMITATIONS: meal prep, cleaning, laundry, shopping, community activity, and yard work  PERSONAL FACTORS: Age, Time since onset of injury/illness/exacerbation, and 3+ comorbidities:  hx LBP/surgery, hx breast cancer, DM, HTN, neck pain, chronic LBP, scoliosis  are also affecting patient's functional outcome.   REHAB POTENTIAL: Fair    CLINICAL DECISION MAKING: Stable/uncomplicated  EVALUATION COMPLEXITY: Low   GOALS: Goals reviewed with patient? Yes  SHORT TERM GOALS: Target date: 10/18/2022    Patient will be independent with HEP in order to improve functional outcomes. Baseline:  Goal status: IN PROGRESS  2.  Patient  will report at least 25% improvement in symptoms for improved quality of life. Baseline:  Goal status: IN PROGRESS   LONG TERM GOALS: Target date: 11/08/2022    Patient will report at least 75% improvement in symptoms for improved quality of life. Baseline:  Goal status: IN PROGRESS  2.  Patient will improve FOTO score by at least 10 points in order to indicate improved tolerance to activity. Baseline: 38% function Goal status: IN PROGRESS  3.  Patient will demonstrate at least 25% improvement in thoracolumbar and cerivcal ROM in all restricted planes for improved ability to move trunk and neck while completing chores. Baseline: see above Goal status: IN PROGRESS  4.  Patient will be able to ambulate at least 250 feet in in order to demonstrate improved tolerance to activity. Baseline: 195 feet Goal status: IN PROGRESS  5.  Patient will be able to complete 5x STS in under 11.4 seconds in order to reduce the risk of falls and demo improved LE strength for household activities. Baseline: 25.22 seconds without UE support for 3 reps, UE on thighs for 2 reps. Relies LLE>RLE Goal status: IN PROGRESS     PLAN:  PT FREQUENCY: 2x/week  PT DURATION: 6 weeks  PLANNED INTERVENTIONS: Therapeutic exercises, Therapeutic activity, Neuromuscular re-education, Balance training, Gait training, Patient/Family education, Joint manipulation, Joint mobilization, Stair training, Orthotic/Fit training, DME instructions, Aquatic Therapy, Dry Needling, Electrical stimulation, Spinal manipulation, Spinal mobilization, Cryotherapy, Moist heat, Compression  bandaging, scar mobilization, Splintting, Taping, Traction, Ultrasound, Ionotophoresis 4mg /ml Dexamethasone, and Manual therapy  PLAN FOR NEXT SESSION: core, hip, postural, and functional strengthening; spinal mobility, posture.  Update HEP as needed.   Bascom Levels, Denielle Bayard B, PTA 10/19/2022, 10:40 AM

## 2022-10-24 ENCOUNTER — Ambulatory Visit (HOSPITAL_COMMUNITY): Payer: Medicare Other | Admitting: Physical Therapy

## 2022-10-24 DIAGNOSIS — R2689 Other abnormalities of gait and mobility: Secondary | ICD-10-CM | POA: Diagnosis not present

## 2022-10-24 DIAGNOSIS — M6281 Muscle weakness (generalized): Secondary | ICD-10-CM | POA: Diagnosis not present

## 2022-10-24 DIAGNOSIS — R29898 Other symptoms and signs involving the musculoskeletal system: Secondary | ICD-10-CM

## 2022-10-24 DIAGNOSIS — R293 Abnormal posture: Secondary | ICD-10-CM | POA: Diagnosis not present

## 2022-10-24 DIAGNOSIS — M542 Cervicalgia: Secondary | ICD-10-CM

## 2022-10-24 DIAGNOSIS — M5412 Radiculopathy, cervical region: Secondary | ICD-10-CM

## 2022-10-24 DIAGNOSIS — M5459 Other low back pain: Secondary | ICD-10-CM | POA: Diagnosis not present

## 2022-10-24 NOTE — Therapy (Signed)
OUTPATIENT PHYSICAL THERAPY TREATMENT  Patient Name: Christine Sutton MRN: [id number] DOB:07/13/1929, 89 y.o., female Today's Date: 10/24/2022  END OF SESSION:  PT End of Session - 10/24/22 1036     Visit Number 6    Number of Visits 12    Date for PT Re-Evaluation 11/08/22    Authorization Type Medicare/BCBS    Progress Note Due on Visit 19    PT Start Time 1032    PT Stop Time 1114    PT Time Calculation (min) 42 min    Activity Tolerance Patient tolerated treatment well    Behavior During Therapy WFL for tasks assessed/performed               Past Medical History:  Diagnosis Date   Breast disorder    Diabetes mellitus (HCC)    Family history of breast cancer    Family history of colon cancer    Family history of lung cancer    High blood cholesterol level    HX: breast cancer    left   Hypertension    Invasive ductal carcinoma of left breast (HCC) 01/27/2014   ER +   Past Surgical History:  Procedure Laterality Date   BACK SURGERY     lower   CATARACT EXTRACTION, BILATERAL  11/2014   COLONOSCOPY WITH PROPOFOL N/A 01/18/2022   Procedure: COLONOSCOPY WITH PROPOFOL;  Surgeon: Dolores Frame, MD;  Location: [facility];  Service: Gastroenterology;  Laterality: N/A;   ESOPHAGOGASTRODUODENOSCOPY (EGD) WITH PROPOFOL N/A 01/18/2022   Procedure: ESOPHAGOGASTRODUODENOSCOPY (EGD) WITH PROPOFOL;  Surgeon: Dolores Frame, MD;  Location: [facility];  Service: Gastroenterology;  Laterality: N/A;   MASTECTOMY Left    SENTINEL NODE BIOPSY Right 05/15/2020   Procedure: SENTINEL NODE BIOPSY;  Surgeon: Lucretia Roers, MD;  Location: [facility];  Service: General;  Laterality: Right;   TOTAL MASTECTOMY Right 05/15/2020   Procedure: TOTAL MASTECTOMY RIGHT BREAST;  Surgeon: Lucretia Roers, MD;  Location: [facility];  Service: General;  Laterality: Right;   Patient Active Problem List   Diagnosis Date Noted   Blood loss anemia 01/16/2022   AKI (acute kidney  injury) (HCC) 01/16/2022   GI bleed 01/15/2022   History of rectal bleeding 05/04/2021   Other spondylosis with radiculopathy, cervical region 01/14/2021   Foraminal stenosis of cervical region 01/14/2021   Family history of breast cancer    Family history of colon cancer    Family history of lung cancer    Genetic testing 06/28/2020   Breast cancer (HCC) 05/15/2020   Malignant neoplasm of upper-outer quadrant of right breast in female, estrogen receptor negative (HCC) 04/28/2020   History of breast cancer 10/18/2019   Pelvic pressure in female 10/18/2019   Left lower quadrant abdominal tenderness without rebound tenderness 10/18/2019   Vulvar itching 10/18/2019   Screening for colorectal cancer 10/18/2019   Invasive ductal carcinoma of left breast (HCC) 01/27/2014   Diabetes mellitus due to underlying condition without complications (HCC) 12/24/2013   Hypertension, essential, benign 12/24/2013    PCP: Carylon Perches MD  REFERRING PROVIDER: Eldred Manges, MD  REFERRING DIAG: M54.50,G89.29 (ICD-10-CM) - Chronic bilateral low back pain, unspecified whether sciatica present  Rationale for Evaluation and Treatment: Rehabilitation  THERAPY DIAG:  Radiculopathy, cervical region  Abnormal posture  Other symptoms and signs involving the musculoskeletal system  Cervicalgia  Muscle weakness (generalized)  ONSET DATE: chronic  SUBJECTIVE:  SUBJECTIVE STATEMENT: Pt states her neck hurts but not as bad today.  5/10 (was 8/10 last visit).   Evaluation: Patient states neck and low back pain. She did some self traction at home with water device. Been using tens on neck and massage tool. Back doesn't bother her with sitting. Neck bothers her all the time. Had back surgery and was alright for a while. PT helped  her last time she was in. She eventually stopped doing HEP provided. Can't sit very long due to symptoms. Patient states low back pain and symptoms into R leg burning to mid calf region. Tylenol and gabapentin seem to help and lidocaine. Wants to work on her neck and back issues. Hasn't been able to sleep in bed in over a year until recently getting an adjustable bed and has been sleeping in it for a week.  PERTINENT HISTORY:  Hx of interspinous device at L5-S1 later fusion at L4-5 with pedicle screws on the right side only, hx breast cancer, DM, HTN, neck pain, chronic LBP  PAIN:  Are you having pain? Yes: NPRS scale: 5/10 Pain location: low back and neck Pain description: burning Aggravating factors: sitting Relieving factors: Tylenol and gabapentin, lidocaine  PRECAUTIONS: Fall  WEIGHT BEARING RESTRICTIONS: No  FALLS:  Has patient fallen in last 6 months? No  OCCUPATION: Retired  PLOF: Independent  PATIENT GOALS: to get better  NEXT MD VISIT: next week  OBJECTIVE:   OBSERVATION: lateral lean to R in sitting most of session   DIAGNOSTIC FINDINGS:  XR 09/08/22 [facility] lateral lumbar spine images are obtained and reviewed this shows right  pedicle instrumentation with cage at L4-5.  Interspinous device L5-S1.   Scoliosis curve approximately 30 degrees right lumbar apex at L2-3  junction.  Considerable facet arthropathy noted.  No compression  fractures.  Endplate sclerosis at L2-3 L3-4.   Impression: Previous fusion as described above.  Scoliosis with  degenerative changes.   PATIENT SURVEYS: FOTO 38% function  SCREENING FOR RED FLAGS: Bowel or bladder incontinence: No Spinal tumors: No Cauda equina syndrome: No Compression fracture: No Abdominal aneurysm: No  COGNITION: Overall cognitive status: Within functional limits for tasks assessed     SENSATION: WFL  POSTURE: rounded shoulders, forward head, and R lateral lean  and trunk flexion due to   scoliosis  PALPATION: TTP L cervical paraspinals, R lumbar paraspinals, glutes  LUMBAR ROM:   AROM eval  Flexion 0% limited *  Extension 75% limited  Right lateral flexion 25% limited  Left lateral flexion 50% limited  Right rotation 90% limited  Left rotation 90%limited   (Blank rows = not tested) *= pain, rotation mostly through shoulders/thoracic spine minimal thoraco/lumbar Cervical  ROM:   AROM eval  Flexion 0% limited *  Extension 25% limited  Right lateral flexion 50% limited  Left lateral flexion 50% limited  Right rotation 25% limited  Left rotation 75% limited   (Blank rows = not tested) *= pain, L sided neck pain in UT region  LOWER EXTREMITY ROM:   WFL for tasks assessed  Active  Right eval Left eval  Hip flexion    Hip extension    Hip abduction    Hip adduction    Hip internal rotation    Hip external rotation    Knee flexion    Knee extension    Ankle dorsiflexion    Ankle plantarflexion    Ankle inversion    Ankle eversion     (Blank rows = not  tested)  LOWER EXTREMITY MMT:    MMT Right eval Left eval  Hip flexion 4 4+  Hip extension    Hip abduction    Hip adduction    Hip internal rotation    Hip external rotation    Knee flexion 5 5  Knee extension 5 5  Ankle dorsiflexion 5 5  Ankle plantarflexion    Ankle inversion    Ankle eversion     (Blank rows = not tested)    FUNCTIONAL TESTS:  5 times sit to stand: 25.22 seconds without UE support for 3 reps, UE on thighs for 2 reps. Relies LLE>RLE 2 minute walk test: 195 feet  GAIT: Distance walked: 195 feet Assistive device utilized: None Level of assistance: Modified independence Comments: , trunk R lateral lean and flexion, decreased stride length, decreased trunk/UE rotation  TODAY'S TREATMENT:                                                                                                                              DATE:  10/24/22 Seated:  cervical retraction  10X5"  Scapular retraction 10X5  Thoracic excursions with UE movements 5X each side Supine: Bridge 2X10 Trunk rotations 10X10" each Standing:  Red theraband scap retr 10X  RTB rows 10X  RTB extensions 10X  10/19/22 Supine: log roll to/from supine Decompression 2-5 10x 3" Decompression with RTB 5X each Seated:  cervical retraction 10X5"  Scapular retraction 10X5 Standing:  Red theraband scap retr 10X  RTB rows 10X  RTB extensions 10X  10/19/22 Supine: log roll to/from supine Decompression 2-5 10x 3" Decompression with RTB 5X each Seated:  cervical retraction 10X5"  Scapular retraction 10X5 Standing:  Red theraband scap retr 10X  RTB rows 10X  RTB extensions 10X  10/17/22 Supine: log roll to/from supine Decompression 2-5 10x 3" Decompression with RTB 5X each Seated:  cervical retraction 10X5"  Scapular retraction 10X5"  10/11/22: STS 10x  Seated: cervical retraction 10x with verbal/tactile cueing Scapular retraction 5x RTB rows 10x Supine: Instructed log rolling mechanics Decompression 2-5 5x 3" Bridge Instructed Rolling Sidelying: Clam   09/29/22 Review of goals Review of seated posture correction Seated: cervical exursions 5X each  Thoracic excursions with UE movements 5X each  Sit to stands no UE with mat beneath bottom 10X Supine:   abdominal isometric 10X5"  Bridge 10X  SLR 10X each  09/27/22 Correct seated posture x 10     PATIENT EDUCATION:  Education details: Patient educated on exam findings, POC, scope of PT, HEP, and posture/scoliosis. Person educated: Patient Education method: Explanation, Demonstration, and Handouts Education comprehension: verbalized understanding, returned demonstration, verbal cues required, and tactile cues required  HOME EXERCISE PROGRAM: Access Code: [email or web address] URL: [email or web address]  Date: 10/19/2022 - Scapular Retraction with Resistance  - 2 x daily - 7 x weekly - 2 sets - 10 reps - Scapular  Retraction with Resistance Advanced  - 2 x daily - 7 x weekly -  2 sets - 10 reps Decompression 1-4 with theraband  10/11/22:  decompression  Date: 09/29/2022 Prepared by: Emeline Gins Exercises - Correct Seated Posture  - 3 x daily - 7 x weekly - 2 sets - 10 reps - Seated Scapular Retraction with External Rotation  - 2 x daily - 7 x weekly - 1 sets - 10 reps - 5 sec hold - Sit to Stand  - 2 x daily - 7 x weekly - 1 sets - 10 reps - Beginner Bridge  - 2 x daily - 7 x weekly - 1 sets - 10 reps - Supine Transversus Abdominis Bracing - Hands on Stomach  - 2 x daily - 7 x weekly - 1 sets - 10 reps - 5 sec hold - Small Range Straight Leg Raise  - 1 x daily - 7 x weekly - 1 sets - 10 reps  Date: 09/27/2022 - Correct Seated Posture  - 3 x daily - 7 x weekly - 2 sets - 10 reps  ASSESSMENT:  CLINICAL IMPRESSION: Continues to need cues to recall therex and complete in correct form/holds.  Pt with improved cervical mobility demonstrated with thoracic UE excursions.  Trunk rotations added with guarding and tightness.  Able to improve form and relax mm with cues. Tendency to sit straight up/down with mobility rather than using logroll technique.  Pt can demonstrate correctly with cues. No increase or decrease in pain at end of session today.  Pt will continue to benefit from skilled therapy to improve deficits and reduce pain.  OBJECTIVE IMPAIRMENTS: Abnormal gait, decreased activity tolerance, decreased balance, decreased endurance, decreased mobility, difficulty walking, decreased ROM, decreased strength, hypomobility, increased muscle spasms, impaired flexibility, improper body mechanics, postural dysfunction, and pain.   ACTIVITY LIMITATIONS: carrying, lifting, bending, standing, squatting, stairs, transfers, reach over head, locomotion level, and caring for others  PARTICIPATION LIMITATIONS: meal prep, cleaning, laundry, shopping, community activity, and yard work  PERSONAL FACTORS: Age, Time since  onset of injury/illness/exacerbation, and 3+ comorbidities:  hx LBP/surgery, hx breast cancer, DM, HTN, neck pain, chronic LBP, scoliosis  are also affecting patient's functional outcome.   REHAB POTENTIAL: Fair    CLINICAL DECISION MAKING: Stable/uncomplicated  EVALUATION COMPLEXITY: Low   GOALS: Goals reviewed with patient? Yes  SHORT TERM GOALS: Target date: 10/18/2022    Patient will be independent with HEP in order to improve functional outcomes. Baseline:  Goal status: IN PROGRESS  2.  Patient will report at least 25% improvement in symptoms for improved quality of life. Baseline:  Goal status: IN PROGRESS   LONG TERM GOALS: Target date: 11/08/2022    Patient will report at least 75% improvement in symptoms for improved quality of life. Baseline:  Goal status: IN PROGRESS  2.  Patient will improve FOTO score by at least 10 points in order to indicate improved tolerance to activity. Baseline: 38% function Goal status: IN PROGRESS  3.  Patient will demonstrate at least 25% improvement in thoracolumbar and cerivcal ROM in all restricted planes for improved ability to move trunk and neck while completing chores. Baseline: see above Goal status: IN PROGRESS  4.  Patient will be able to ambulate at least 250 feet in in order to demonstrate improved tolerance to activity. Baseline: 195 feet Goal status: IN PROGRESS  5.  Patient will be able to complete 5x STS in under 11.4 seconds in order to reduce the risk of falls and demo improved LE strength for household activities. Baseline: 25.22 seconds without  UE support for 3 reps, UE on thighs for 2 reps. Relies LLE>RLE Goal status: IN PROGRESS     PLAN:  PT FREQUENCY: 2x/week  PT DURATION: 6 weeks  PLANNED INTERVENTIONS: Therapeutic exercises, Therapeutic activity, Neuromuscular re-education, Balance training, Gait training, Patient/Family education, Joint manipulation, Joint mobilization, Stair training,  Orthotic/Fit training, DME instructions, Aquatic Therapy, Dry Needling, Electrical stimulation, Spinal manipulation, Spinal mobilization, Cryotherapy, Moist heat, Compression bandaging, scar mobilization, Splintting, Taping, Traction, Ultrasound, Ionotophoresis 4mg /ml Dexamethasone, and Manual therapy  PLAN FOR NEXT SESSION: core, hip, postural, and functional strengthening; spinal mobility, posture.  Update HEP as needed.  Lurena Nida, PTA/CLT [facility] Ph: [phone]  Lurena Nida, PTA 10/24/2022, 11:48 AM

## 2022-10-26 ENCOUNTER — Ambulatory Visit (HOSPITAL_COMMUNITY): Payer: Medicare Other | Admitting: Physical Therapy

## 2022-10-26 DIAGNOSIS — R293 Abnormal posture: Secondary | ICD-10-CM

## 2022-10-26 DIAGNOSIS — M5459 Other low back pain: Secondary | ICD-10-CM | POA: Diagnosis not present

## 2022-10-26 DIAGNOSIS — M6281 Muscle weakness (generalized): Secondary | ICD-10-CM | POA: Diagnosis not present

## 2022-10-26 DIAGNOSIS — R29898 Other symptoms and signs involving the musculoskeletal system: Secondary | ICD-10-CM

## 2022-10-26 DIAGNOSIS — M5412 Radiculopathy, cervical region: Secondary | ICD-10-CM

## 2022-10-26 DIAGNOSIS — R2689 Other abnormalities of gait and mobility: Secondary | ICD-10-CM | POA: Diagnosis not present

## 2022-10-26 NOTE — Therapy (Signed)
OUTPATIENT PHYSICAL THERAPY TREATMENT  Patient Name: Christine Sutton MRN: [id number] DOB:December 16, 1929, 89 y.o., female Today's Date: 10/26/2022  END OF SESSION:  PT End of Session - 10/26/22 1117     Visit Number 7    Number of Visits 12    Date for PT Re-Evaluation 11/08/22    Authorization Type Medicare/BCBS    Progress Note Due on Visit 19    PT Start Time 1115    PT Stop Time 1150    PT Time Calculation (min) 35 min    Activity Tolerance Patient tolerated treatment well    Behavior During Therapy WFL for tasks assessed/performed               Past Medical History:  Diagnosis Date   Breast disorder    Diabetes mellitus (HCC)    Family history of breast cancer    Family history of colon cancer    Family history of lung cancer    High blood cholesterol level    HX: breast cancer    left   Hypertension    Invasive ductal carcinoma of left breast (HCC) 01/27/2014   ER +   Past Surgical History:  Procedure Laterality Date   BACK SURGERY     lower   CATARACT EXTRACTION, BILATERAL  11/2014   COLONOSCOPY WITH PROPOFOL N/A 01/18/2022   Procedure: COLONOSCOPY WITH PROPOFOL;  Surgeon: Dolores Frame, MD;  Location: [facility];  Service: Gastroenterology;  Laterality: N/A;   ESOPHAGOGASTRODUODENOSCOPY (EGD) WITH PROPOFOL N/A 01/18/2022   Procedure: ESOPHAGOGASTRODUODENOSCOPY (EGD) WITH PROPOFOL;  Surgeon: Dolores Frame, MD;  Location: [facility];  Service: Gastroenterology;  Laterality: N/A;   MASTECTOMY Left    SENTINEL NODE BIOPSY Right 05/15/2020   Procedure: SENTINEL NODE BIOPSY;  Surgeon: Lucretia Roers, MD;  Location: [facility];  Service: General;  Laterality: Right;   TOTAL MASTECTOMY Right 05/15/2020   Procedure: TOTAL MASTECTOMY RIGHT BREAST;  Surgeon: Lucretia Roers, MD;  Location: [facility];  Service: General;  Laterality: Right;   Patient Active Problem List   Diagnosis Date Noted   Blood loss anemia 01/16/2022   AKI (acute kidney  injury) (HCC) 01/16/2022   GI bleed 01/15/2022   History of rectal bleeding 05/04/2021   Other spondylosis with radiculopathy, cervical region 01/14/2021   Foraminal stenosis of cervical region 01/14/2021   Family history of breast cancer    Family history of colon cancer    Family history of lung cancer    Genetic testing 06/28/2020   Breast cancer (HCC) 05/15/2020   Malignant neoplasm of upper-outer quadrant of right breast in female, estrogen receptor negative (HCC) 04/28/2020   History of breast cancer 10/18/2019   Pelvic pressure in female 10/18/2019   Left lower quadrant abdominal tenderness without rebound tenderness 10/18/2019   Vulvar itching 10/18/2019   Screening for colorectal cancer 10/18/2019   Invasive ductal carcinoma of left breast (HCC) 01/27/2014   Diabetes mellitus due to underlying condition without complications (HCC) 12/24/2013   Hypertension, essential, benign 12/24/2013    PCP: Carylon Perches MD  REFERRING PROVIDER: Eldred Manges, MD  REFERRING DIAG: M54.50,G89.29 (ICD-10-CM) - Chronic bilateral low back pain, unspecified whether sciatica present  Rationale for Evaluation and Treatment: Rehabilitation  THERAPY DIAG:  Radiculopathy, cervical region  Abnormal posture  Other symptoms and signs involving the musculoskeletal system  ONSET DATE: chronic  SUBJECTIVE:  SUBJECTIVE STATEMENT: Pt states still having the Lt side neck pain at 3/10, overall getting better.  States she takes arthritis tylenol every morning and believe this helps.   Evaluation: Patient states neck and low back pain. She did some self traction at home with water device. Been using tens on neck and massage tool. Back doesn't bother her with sitting. Neck bothers her all the time. Had back surgery and was  alright for a while. PT helped her last time she was in. She eventually stopped doing HEP provided. Can't sit very long due to symptoms. Patient states low back pain and symptoms into R leg burning to mid calf region. Tylenol and gabapentin seem to help and lidocaine. Wants to work on her neck and back issues. Hasn't been able to sleep in bed in over a year until recently getting an adjustable bed and has been sleeping in it for a week.  PERTINENT HISTORY:  Hx of interspinous device at L5-S1 later fusion at L4-5 with pedicle screws on the right side only, hx breast cancer, DM, HTN, neck pain, chronic LBP  PAIN:  Are you having pain? Yes: NPRS scale: 3/10 Pain location: low back and neck Pain description: burning Aggravating factors: sitting Relieving factors: Tylenol and gabapentin, lidocaine  PRECAUTIONS: Fall  WEIGHT BEARING RESTRICTIONS: No  FALLS:  Has patient fallen in last 6 months? No  OCCUPATION: Retired  PLOF: Independent  PATIENT GOALS: to get better  NEXT MD VISIT: next week  OBJECTIVE:   OBSERVATION: lateral lean to R in sitting most of session   DIAGNOSTIC FINDINGS:  XR 09/08/22 [facility] lateral lumbar spine images are obtained and reviewed this shows right  pedicle instrumentation with cage at L4-5.  Interspinous device L5-S1.   Scoliosis curve approximately 30 degrees right lumbar apex at L2-3  junction.  Considerable facet arthropathy noted.  No compression  fractures.  Endplate sclerosis at L2-3 L3-4.   Impression: Previous fusion as described above.  Scoliosis with  degenerative changes.   PATIENT SURVEYS: FOTO 38% function  SCREENING FOR RED FLAGS: Bowel or bladder incontinence: No Spinal tumors: No Cauda equina syndrome: No Compression fracture: No Abdominal aneurysm: No  COGNITION: Overall cognitive status: Within functional limits for tasks assessed     SENSATION: WFL  POSTURE: rounded shoulders, forward head, and R lateral lean  and trunk  flexion due to  scoliosis  PALPATION: TTP L cervical paraspinals, R lumbar paraspinals, glutes  LUMBAR ROM:   AROM eval  Flexion 0% limited *  Extension 75% limited  Right lateral flexion 25% limited  Left lateral flexion 50% limited  Right rotation 90% limited  Left rotation 90%limited   (Blank rows = not tested) *= pain, rotation mostly through shoulders/thoracic spine minimal thoraco/lumbar Cervical  ROM:   AROM eval  Flexion 0% limited *  Extension 25% limited  Right lateral flexion 50% limited  Left lateral flexion 50% limited  Right rotation 25% limited  Left rotation 75% limited   (Blank rows = not tested) *= pain, L sided neck pain in UT region  LOWER EXTREMITY ROM:   WFL for tasks assessed  Active  Right eval Left eval  Hip flexion    Hip extension    Hip abduction    Hip adduction    Hip internal rotation    Hip external rotation    Knee flexion    Knee extension    Ankle dorsiflexion    Ankle plantarflexion    Ankle inversion  Ankle eversion     (Blank rows = not tested)  LOWER EXTREMITY MMT:    MMT Right eval Left eval  Hip flexion 4 4+  Hip extension    Hip abduction    Hip adduction    Hip internal rotation    Hip external rotation    Knee flexion 5 5  Knee extension 5 5  Ankle dorsiflexion 5 5  Ankle plantarflexion    Ankle inversion    Ankle eversion     (Blank rows = not tested)    FUNCTIONAL TESTS:  5 times sit to stand: 25.22 seconds without UE support for 3 reps, UE on thighs for 2 reps. Relies LLE>RLE 2 minute walk test: 195 feet  GAIT: Distance walked: 195 feet Assistive device utilized: None Level of assistance: Modified independence Comments: , trunk R lateral lean and flexion, decreased stride length, decreased trunk/UE rotation  TODAY'S TREATMENT:                                                                                                                              DATE:  10/26/22 Seated cervical  retraction 10X5"  Scapular retractions 10X5"  UE flexion following movements 10X  RTB rows 2X10  RTB retractions 2X10  RTB extensions 2X10 Soft tissue massage to Lt cervical region/trap/levators  10/24/22 Seated:  cervical retraction 10X5"  Scapular retraction 10X5  Thoracic excursions with UE movements 5X each side Supine: Bridge 2X10 Trunk rotations 10X10" each Seated:  Red theraband scap retr 10X  RTB rows 10X  RTB extensions 10X  10/19/22 Supine: log roll to/from supine Decompression 2-5 10x 3" Decompression with RTB 5X each Seated:  cervical retraction 10X5"  Scapular retraction 10X5 Standing:  Red theraband scap retr 10X  RTB rows 10X  RTB extensions 10X  10/19/22 Supine: log roll to/from supine Decompression 2-5 10x 3" Decompression with RTB 5X each Seated:  cervical retraction 10X5"  Scapular retraction 10X5 Standing:  Red theraband scap retr 10X  RTB rows 10X  RTB extensions 10X  10/17/22 Supine: log roll to/from supine Decompression 2-5 10x 3" Decompression with RTB 5X each Seated:  cervical retraction 10X5"  Scapular retraction 10X5"  10/11/22: STS 10x  Seated: cervical retraction 10x with verbal/tactile cueing Scapular retraction 5x RTB rows 10x Supine: Instructed log rolling mechanics Decompression 2-5 5x 3" Bridge Instructed Rolling Sidelying: Clam   09/29/22 Review of goals Review of seated posture correction Seated: cervical exursions 5X each  Thoracic excursions with UE movements 5X each  Sit to stands no UE with mat beneath bottom 10X Supine:   abdominal isometric 10X5"  Bridge 10X  SLR 10X each  09/27/22 Correct seated posture x 10     PATIENT EDUCATION:  Education details: Patient educated on exam findings, POC, scope of PT, HEP, and posture/scoliosis. Person educated: Patient Education method: Explanation, Demonstration, and Handouts Education comprehension: verbalized understanding, returned demonstration, verbal cues required,  and tactile cues required  HOME EXERCISE  PROGRAM: Access Code: [email or web address] URL: [email or web address]  Date: 10/19/2022 - Scapular Retraction with Resistance  - 2 x daily - 7 x weekly - 2 sets - 10 reps - Scapular Retraction with Resistance Advanced  - 2 x daily - 7 x weekly - 2 sets - 10 reps Decompression 1-4 with theraband  10/11/22:  decompression  Date: 09/29/2022 Prepared by: Emeline Gins Exercises - Correct Seated Posture  - 3 x daily - 7 x weekly - 2 sets - 10 reps - Seated Scapular Retraction with External Rotation  - 2 x daily - 7 x weekly - 1 sets - 10 reps - 5 sec hold - Sit to Stand  - 2 x daily - 7 x weekly - 1 sets - 10 reps - Beginner Bridge  - 2 x daily - 7 x weekly - 1 sets - 10 reps - Supine Transversus Abdominis Bracing - Hands on Stomach  - 2 x daily - 7 x weekly - 1 sets - 10 reps - 5 sec hold - Small Range Straight Leg Raise  - 1 x daily - 7 x weekly - 1 sets - 10 reps  Date: 09/27/2022 - Correct Seated Posture  - 3 x daily - 7 x weekly - 2 sets - 10 reps  ASSESSMENT:  CLINICAL IMPRESSION: Completed seated therex with moderate cues for posturing.  Pt with tendency to lean back in chair that worsens with added UE activities.  Additional set of most exercises added today without pain or issues.  Began manual to cervical and thoracic region with some tightness and one small spasm in Lt upper trap.  None in paraspinals or levator region on Lt today.  PT reported overall improvement following therapy today.  No new exercises added to HEP today.  Pt will continue to benefit from skilled therapy to improve deficits and reduce pain.  OBJECTIVE IMPAIRMENTS: Abnormal gait, decreased activity tolerance, decreased balance, decreased endurance, decreased mobility, difficulty walking, decreased ROM, decreased strength, hypomobility, increased muscle spasms, impaired flexibility, improper body mechanics, postural dysfunction, and pain.   ACTIVITY LIMITATIONS:  carrying, lifting, bending, standing, squatting, stairs, transfers, reach over head, locomotion level, and caring for others  PARTICIPATION LIMITATIONS: meal prep, cleaning, laundry, shopping, community activity, and yard work  PERSONAL FACTORS: Age, Time since onset of injury/illness/exacerbation, and 3+ comorbidities:  hx LBP/surgery, hx breast cancer, DM, HTN, neck pain, chronic LBP, scoliosis  are also affecting patient's functional outcome.   REHAB POTENTIAL: Fair    CLINICAL DECISION MAKING: Stable/uncomplicated  EVALUATION COMPLEXITY: Low   GOALS: Goals reviewed with patient? Yes  SHORT TERM GOALS: Target date: 10/18/2022    Patient will be independent with HEP in order to improve functional outcomes. Baseline:  Goal status: IN PROGRESS  2.  Patient will report at least 25% improvement in symptoms for improved quality of life. Baseline:  Goal status: IN PROGRESS   LONG TERM GOALS: Target date: 11/08/2022    Patient will report at least 75% improvement in symptoms for improved quality of life. Baseline:  Goal status: IN PROGRESS  2.  Patient will improve FOTO score by at least 10 points in order to indicate improved tolerance to activity. Baseline: 38% function Goal status: IN PROGRESS  3.  Patient will demonstrate at least 25% improvement in thoracolumbar and cerivcal ROM in all restricted planes for improved ability to move trunk and neck while completing chores. Baseline: see above Goal status: IN PROGRESS  4.  Patient will be able to ambulate at least  250 feet in in order to demonstrate improved tolerance to activity. Baseline: 195 feet Goal status: IN PROGRESS  5.  Patient will be able to complete 5x STS in under 11.4 seconds in order to reduce the risk of falls and demo improved LE strength for household activities. Baseline: 25.22 seconds without UE support for 3 reps, UE on thighs for 2 reps. Relies LLE>RLE Goal status: IN PROGRESS     PLAN:  PT  FREQUENCY: 2x/week  PT DURATION: 6 weeks  PLANNED INTERVENTIONS: Therapeutic exercises, Therapeutic activity, Neuromuscular re-education, Balance training, Gait training, Patient/Family education, Joint manipulation, Joint mobilization, Stair training, Orthotic/Fit training, DME instructions, Aquatic Therapy, Dry Needling, Electrical stimulation, Spinal manipulation, Spinal mobilization, Cryotherapy, Moist heat, Compression bandaging, scar mobilization, Splintting, Taping, Traction, Ultrasound, Ionotophoresis 4mg /ml Dexamethasone, and Manual therapy  PLAN FOR NEXT SESSION: core, hip, postural, and functional strengthening; spinal mobility, posture.  Update HEP next session to include postural exercises.  Begin standing/progression.  Lurena Nida, PTA/CLT [facility] Ph: [phone]  Lurena Nida, PTA 10/26/2022, 1:14 PM

## 2022-10-31 ENCOUNTER — Encounter (HOSPITAL_COMMUNITY): Payer: Self-pay | Admitting: Physical Therapy

## 2022-10-31 ENCOUNTER — Ambulatory Visit (HOSPITAL_COMMUNITY): Payer: Medicare Other | Admitting: Physical Therapy

## 2022-10-31 DIAGNOSIS — R2689 Other abnormalities of gait and mobility: Secondary | ICD-10-CM

## 2022-10-31 DIAGNOSIS — M5459 Other low back pain: Secondary | ICD-10-CM | POA: Diagnosis not present

## 2022-10-31 DIAGNOSIS — M5412 Radiculopathy, cervical region: Secondary | ICD-10-CM | POA: Diagnosis not present

## 2022-10-31 DIAGNOSIS — R29898 Other symptoms and signs involving the musculoskeletal system: Secondary | ICD-10-CM

## 2022-10-31 DIAGNOSIS — M6281 Muscle weakness (generalized): Secondary | ICD-10-CM

## 2022-10-31 DIAGNOSIS — M542 Cervicalgia: Secondary | ICD-10-CM

## 2022-10-31 DIAGNOSIS — R293 Abnormal posture: Secondary | ICD-10-CM

## 2022-10-31 NOTE — Therapy (Signed)
OUTPATIENT PHYSICAL THERAPY TREATMENT  Patient Name: Christine Sutton MRN: [id number] DOB:[birth year], 89 y.o., female Today's Date: 10/31/2022  END OF SESSION:  PT End of Session - 10/31/22 0941     Visit Number 8    Number of Visits 12    Date for PT Re-Evaluation 11/08/22    Authorization Type Medicare/BCBS    Progress Note Due on Visit 19    PT Start Time 0945    PT Stop Time 1023    PT Time Calculation (min) 38 min    Activity Tolerance Patient tolerated treatment well    Behavior During Therapy WFL for tasks assessed/performed               Past Medical History:  Diagnosis Date   Breast disorder    Diabetes mellitus (HCC)    Family history of breast cancer    Family history of colon cancer    Family history of lung cancer    High blood cholesterol level    HX: breast cancer    left   Hypertension    Invasive ductal carcinoma of left breast (HCC) 01/27/2014   ER +   Past Surgical History:  Procedure Laterality Date   BACK SURGERY     lower   CATARACT EXTRACTION, BILATERAL  11/2014   COLONOSCOPY WITH PROPOFOL N/A 01/18/2022   Procedure: COLONOSCOPY WITH PROPOFOL;  Surgeon: Dolores Frame, MD;  Location: [facility];  Service: Gastroenterology;  Laterality: N/A;   ESOPHAGOGASTRODUODENOSCOPY (EGD) WITH PROPOFOL N/A 01/18/2022   Procedure: ESOPHAGOGASTRODUODENOSCOPY (EGD) WITH PROPOFOL;  Surgeon: Dolores Frame, MD;  Location: [facility];  Service: Gastroenterology;  Laterality: N/A;   MASTECTOMY Left    SENTINEL NODE BIOPSY Right 05/15/2020   Procedure: SENTINEL NODE BIOPSY;  Surgeon: Lucretia Roers, MD;  Location: [facility];  Service: General;  Laterality: Right;   TOTAL MASTECTOMY Right 05/15/2020   Procedure: TOTAL MASTECTOMY RIGHT BREAST;  Surgeon: Lucretia Roers, MD;  Location: [facility];  Service: General;  Laterality: Right;   Patient Active Problem List   Diagnosis Date Noted   Blood loss anemia 01/16/2022   AKI (acute kidney  injury) (HCC) 01/16/2022   GI bleed 01/15/2022   History of rectal bleeding 05/04/2021   Other spondylosis with radiculopathy, cervical region 01/14/2021   Foraminal stenosis of cervical region 01/14/2021   Family history of breast cancer    Family history of colon cancer    Family history of lung cancer    Genetic testing 06/28/2020   Breast cancer (HCC) 05/15/2020   Malignant neoplasm of upper-outer quadrant of right breast in female, estrogen receptor negative (HCC) 04/28/2020   History of breast cancer 10/18/2019   Pelvic pressure in female 10/18/2019   Left lower quadrant abdominal tenderness without rebound tenderness 10/18/2019   Vulvar itching 10/18/2019   Screening for colorectal cancer 10/18/2019   Invasive ductal carcinoma of left breast (HCC) 01/27/2014   Diabetes mellitus due to underlying condition without complications (HCC) 12/24/2013   Hypertension, essential, benign 12/24/2013    PCP: Carylon Perches MD  REFERRING PROVIDER: Eldred Manges, MD  REFERRING DIAG: M54.50,G89.29 (ICD-10-CM) - Chronic bilateral low back pain, unspecified whether sciatica present  Rationale for Evaluation and Treatment: Rehabilitation  THERAPY DIAG:  Other low back pain  Muscle weakness (generalized)  Other abnormalities of gait and mobility  Other symptoms and signs involving the musculoskeletal system  Cervicalgia  Abnormal posture  ONSET DATE: chronic  SUBJECTIVE:  SUBJECTIVE STATEMENT: Pt states she feels like she is getting stronger. Right now she is not having pain. Neck continues to bother her on L side. Massage worked wonders. HEP is going well.   Evaluation: Patient states neck and low back pain. She did some self traction at home with water device. Been using tens on neck and massage tool.  Back doesn't bother her with sitting. Neck bothers her all the time. Had back surgery and was alright for a while. PT helped her last time she was in. She eventually stopped doing HEP provided. Can't sit very long due to symptoms. Patient states low back pain and symptoms into R leg burning to mid calf region. Tylenol and gabapentin seem to help and lidocaine. Wants to work on her neck and back issues. Hasn't been able to sleep in bed in over a year until recently getting an adjustable bed and has been sleeping in it for a week.  PERTINENT HISTORY:  Hx of interspinous device at L5-S1 later fusion at L4-5 with pedicle screws on the right side only, hx breast cancer, DM, HTN, neck pain, chronic LBP  PAIN:  Are you having pain? Yes: NPRS scale: 0/10 Pain location: low back and neck Pain description: burning Aggravating factors: sitting Relieving factors: Tylenol and gabapentin, lidocaine  PRECAUTIONS: Fall  WEIGHT BEARING RESTRICTIONS: No  FALLS:  Has patient fallen in last 6 months? No  OCCUPATION: Retired  PLOF: Independent  PATIENT GOALS: to get better  NEXT MD VISIT: next week  OBJECTIVE:   OBSERVATION: lateral lean to R in sitting most of session   DIAGNOSTIC FINDINGS:  XR 09/08/22 [facility] lateral lumbar spine images are obtained and reviewed this shows right  pedicle instrumentation with cage at L4-5.  Interspinous device L5-S1.   Scoliosis curve approximately 30 degrees right lumbar apex at L2-3  junction.  Considerable facet arthropathy noted.  No compression  fractures.  Endplate sclerosis at L2-3 L3-4.   Impression: Previous fusion as described above.  Scoliosis with  degenerative changes.   PATIENT SURVEYS: FOTO 38% function  SCREENING FOR RED FLAGS: Bowel or bladder incontinence: No Spinal tumors: No Cauda equina syndrome: No Compression fracture: No Abdominal aneurysm: No  COGNITION: Overall cognitive status: Within functional limits for tasks  assessed     SENSATION: WFL  POSTURE: rounded shoulders, forward head, and R lateral lean  and trunk flexion due to  scoliosis  PALPATION: TTP L cervical paraspinals, R lumbar paraspinals, glutes  LUMBAR ROM:   AROM eval  Flexion 0% limited *  Extension 75% limited  Right lateral flexion 25% limited  Left lateral flexion 50% limited  Right rotation 90% limited  Left rotation 90%limited   (Blank rows = not tested) *= pain, rotation mostly through shoulders/thoracic spine minimal thoraco/lumbar Cervical  ROM:   AROM eval  Flexion 0% limited *  Extension 25% limited  Right lateral flexion 50% limited  Left lateral flexion 50% limited  Right rotation 25% limited  Left rotation 75% limited   (Blank rows = not tested) *= pain, L sided neck pain in UT region  LOWER EXTREMITY ROM:   WFL for tasks assessed  Active  Right eval Left eval  Hip flexion    Hip extension    Hip abduction    Hip adduction    Hip internal rotation    Hip external rotation    Knee flexion    Knee extension    Ankle dorsiflexion    Ankle plantarflexion  Ankle inversion    Ankle eversion     (Blank rows = not tested)  LOWER EXTREMITY MMT:    MMT Right eval Left eval  Hip flexion 4 4+  Hip extension    Hip abduction    Hip adduction    Hip internal rotation    Hip external rotation    Knee flexion 5 5  Knee extension 5 5  Ankle dorsiflexion 5 5  Ankle plantarflexion    Ankle inversion    Ankle eversion     (Blank rows = not tested)    FUNCTIONAL TESTS:  5 times sit to stand: 25.22 seconds without UE support for 3 reps, UE on thighs for 2 reps. Relies LLE>RLE 2 minute walk test: 195 feet  GAIT: Distance walked: 195 feet Assistive device utilized: None Level of assistance: Modified independence Comments: , trunk R lateral lean and flexion, decreased stride length, decreased trunk/UE rotation  TODAY'S TREATMENT:                                                                                                                               DATE:  10/31/22 Seated cervical retractions 2 x 10  Seated scap retractions 2 x 10  Upper trap stretch 3 x 20 seconds bilateral  Standing row RTB 2 x 10 Standing shoulder extension RTB 2 x 10 Scapular retraction with GH ER RTB 1 x 10  Standing alternating marches 1 x 10 bilateral Standing hip abduction 1 x 10 bilateral Standing hip extension 1 x 10 bilateral Lateral stepping 5 x 10 feet bilateral Shoulder horizontal abduction RTB 1 x 10  Nustep  Level 2 seat 9, 5 minutes for conditioning  10/26/22 Seated cervical retraction 10X5"  Scapular retractions 10X5"  UE flexion following movements 10X  RTB rows 2X10  RTB retractions 2X10  RTB extensions 2X10 Soft tissue massage to Lt cervical region/trap/levators  10/24/22 Seated:  cervical retraction 10X5"  Scapular retraction 10X5  Thoracic excursions with UE movements 5X each side Supine: Bridge 2X10 Trunk rotations 10X10" each Seated:  Red theraband scap retr 10X  RTB rows 10X  RTB extensions 10X  10/19/22 Supine: log roll to/from supine Decompression 2-5 10x 3" Decompression with RTB 5X each Seated:  cervical retraction 10X5"  Scapular retraction 10X5 Standing:  Red theraband scap retr 10X  RTB rows 10X  RTB extensions 10X   PATIENT EDUCATION:  Education details: 5/20: HEP;  Eval: Patient educated on exam findings, POC, scope of PT, HEP, and posture/scoliosis. Person educated: Patient Education method: Explanation, Demonstration, and Handouts Education comprehension: verbalized understanding, returned demonstration, verbal cues required, and tactile cues required  HOME EXERCISE PROGRAM: Access Code: [email or web address] URL: [email or web address]  10/31/22 - Shoulder External Rotation and Scapular Retraction with Resistance  - 1 x daily - 7 x weekly - 2 sets - 10 reps - Standing March with Counter Support  - 1 x daily - 7 x weekly - 2 sets - 10  reps -  Standing Hip Abduction with Counter Support  - 1 x daily - 7 x weekly - 2 sets - 10 reps  Date: 10/19/2022 - Scapular Retraction with Resistance  - 2 x daily - 7 x weekly - 2 sets - 10 reps - Scapular Retraction with Resistance Advanced  - 2 x daily - 7 x weekly - 2 sets - 10 reps Decompression 1-4 with theraband  10/11/22:  decompression  Date: 09/29/2022 Prepared by: Emeline Gins Exercises - Correct Seated Posture  - 3 x daily - 7 x weekly - 2 sets - 10 reps - Seated Scapular Retraction with External Rotation  - 2 x daily - 7 x weekly - 1 sets - 10 reps - 5 sec hold - Sit to Stand  - 2 x daily - 7 x weekly - 1 sets - 10 reps - Beginner Bridge  - 2 x daily - 7 x weekly - 1 sets - 10 reps - Supine Transversus Abdominis Bracing - Hands on Stomach  - 2 x daily - 7 x weekly - 1 sets - 10 reps - 5 sec hold - Small Range Straight Leg Raise  - 1 x daily - 7 x weekly - 1 sets - 10 reps  Date: 09/27/2022 - Correct Seated Posture  - 3 x daily - 7 x weekly - 2 sets - 10 reps  ASSESSMENT:  CLINICAL IMPRESSION: Continued with cervical mobility and postural strengthening which is tolerated well. Moderate to high fatigue requiring intermittent rest breaks. Began additional LE strengthening and added to HEP.  Performed nustep at end of session for conditioning and endurance. Patient will continue to benefit from physical therapy in order to improve function and reduce impairment.   OBJECTIVE IMPAIRMENTS: Abnormal gait, decreased activity tolerance, decreased balance, decreased endurance, decreased mobility, difficulty walking, decreased ROM, decreased strength, hypomobility, increased muscle spasms, impaired flexibility, improper body mechanics, postural dysfunction, and pain.   ACTIVITY LIMITATIONS: carrying, lifting, bending, standing, squatting, stairs, transfers, reach over head, locomotion level, and caring for others  PARTICIPATION LIMITATIONS: meal prep, cleaning, laundry, shopping, community  activity, and yard work  PERSONAL FACTORS: Age, Time since onset of injury/illness/exacerbation, and 3+ comorbidities:  hx LBP/surgery, hx breast cancer, DM, HTN, neck pain, chronic LBP, scoliosis  are also affecting patient's functional outcome.   REHAB POTENTIAL: Fair    CLINICAL DECISION MAKING: Stable/uncomplicated  EVALUATION COMPLEXITY: Low   GOALS: Goals reviewed with patient? Yes  SHORT TERM GOALS: Target date: 10/18/2022    Patient will be independent with HEP in order to improve functional outcomes. Baseline:  Goal status: IN PROGRESS  2.  Patient will report at least 25% improvement in symptoms for improved quality of life. Baseline:  Goal status: IN PROGRESS   LONG TERM GOALS: Target date: 11/08/2022    Patient will report at least 75% improvement in symptoms for improved quality of life. Baseline:  Goal status: IN PROGRESS  2.  Patient will improve FOTO score by at least 10 points in order to indicate improved tolerance to activity. Baseline: 38% function Goal status: IN PROGRESS  3.  Patient will demonstrate at least 25% improvement in thoracolumbar and cerivcal ROM in all restricted planes for improved ability to move trunk and neck while completing chores. Baseline: see above Goal status: IN PROGRESS  4.  Patient will be able to ambulate at least 250 feet in in order to demonstrate improved tolerance to activity. Baseline: 195 feet Goal status: IN PROGRESS  5.  Patient will be able to complete 5x STS in under 11.4 seconds in order to reduce the risk of falls and demo improved LE strength for household activities. Baseline: 25.22 seconds without UE support for 3 reps, UE on thighs for 2 reps. Relies LLE>RLE Goal status: IN PROGRESS     PLAN:  PT FREQUENCY: 2x/week  PT DURATION: 6 weeks  PLANNED INTERVENTIONS: Therapeutic exercises, Therapeutic activity, Neuromuscular re-education, Balance training, Gait training, Patient/Family education,  Joint manipulation, Joint mobilization, Stair training, Orthotic/Fit training, DME instructions, Aquatic Therapy, Dry Needling, Electrical stimulation, Spinal manipulation, Spinal mobilization, Cryotherapy, Moist heat, Compression bandaging, scar mobilization, Splintting, Taping, Traction, Ultrasound, Ionotophoresis 4mg /ml Dexamethasone, and Manual therapy  PLAN FOR NEXT SESSION: core, hip, postural, and functional strengthening; spinal mobility, posture.     Reola Mosher Khamani Fairley, PT 10/31/2022, 9:42 AM

## 2022-11-02 ENCOUNTER — Ambulatory Visit (HOSPITAL_COMMUNITY): Payer: Medicare Other | Admitting: Physical Therapy

## 2022-11-02 ENCOUNTER — Encounter (HOSPITAL_COMMUNITY): Payer: Self-pay | Admitting: Physical Therapy

## 2022-11-02 DIAGNOSIS — M5412 Radiculopathy, cervical region: Secondary | ICD-10-CM | POA: Diagnosis not present

## 2022-11-02 DIAGNOSIS — R2689 Other abnormalities of gait and mobility: Secondary | ICD-10-CM | POA: Diagnosis not present

## 2022-11-02 DIAGNOSIS — R29898 Other symptoms and signs involving the musculoskeletal system: Secondary | ICD-10-CM

## 2022-11-02 DIAGNOSIS — R293 Abnormal posture: Secondary | ICD-10-CM | POA: Diagnosis not present

## 2022-11-02 DIAGNOSIS — M6281 Muscle weakness (generalized): Secondary | ICD-10-CM | POA: Diagnosis not present

## 2022-11-02 DIAGNOSIS — M5459 Other low back pain: Secondary | ICD-10-CM

## 2022-11-02 DIAGNOSIS — M542 Cervicalgia: Secondary | ICD-10-CM

## 2022-11-02 NOTE — Therapy (Signed)
OUTPATIENT PHYSICAL THERAPY TREATMENT  Patient Name: Christine Sutton MRN: [id number] DOB:[birth year], 89 y.o., female Today's Date: 11/02/2022  END OF SESSION:  PT End of Session - 11/02/22 1031     Visit Number 9    Number of Visits 12    Date for PT Re-Evaluation 11/08/22    Authorization Type Medicare/BCBS    Progress Note Due on Visit 19    PT Start Time 1031    PT Stop Time 1109    PT Time Calculation (min) 38 min    Activity Tolerance Patient tolerated treatment well    Behavior During Therapy WFL for tasks assessed/performed               Past Medical History:  Diagnosis Date   Breast disorder    Diabetes mellitus (HCC)    Family history of breast cancer    Family history of colon cancer    Family history of lung cancer    High blood cholesterol level    HX: breast cancer    left   Hypertension    Invasive ductal carcinoma of left breast (HCC) 01/27/2014   ER +   Past Surgical History:  Procedure Laterality Date   BACK SURGERY     lower   CATARACT EXTRACTION, BILATERAL  11/2014   COLONOSCOPY WITH PROPOFOL N/A 01/18/2022   Procedure: COLONOSCOPY WITH PROPOFOL;  Surgeon: Dolores Frame, MD;  Location: [facility];  Service: Gastroenterology;  Laterality: N/A;   ESOPHAGOGASTRODUODENOSCOPY (EGD) WITH PROPOFOL N/A 01/18/2022   Procedure: ESOPHAGOGASTRODUODENOSCOPY (EGD) WITH PROPOFOL;  Surgeon: Dolores Frame, MD;  Location: [facility];  Service: Gastroenterology;  Laterality: N/A;   MASTECTOMY Left    SENTINEL NODE BIOPSY Right 05/15/2020   Procedure: SENTINEL NODE BIOPSY;  Surgeon: Lucretia Roers, MD;  Location: [facility];  Service: General;  Laterality: Right;   TOTAL MASTECTOMY Right 05/15/2020   Procedure: TOTAL MASTECTOMY RIGHT BREAST;  Surgeon: Lucretia Roers, MD;  Location: [facility];  Service: General;  Laterality: Right;   Patient Active Problem List   Diagnosis Date Noted   Blood loss anemia 01/16/2022   AKI (acute kidney  injury) (HCC) 01/16/2022   GI bleed 01/15/2022   History of rectal bleeding 05/04/2021   Other spondylosis with radiculopathy, cervical region 01/14/2021   Foraminal stenosis of cervical region 01/14/2021   Family history of breast cancer    Family history of colon cancer    Family history of lung cancer    Genetic testing 06/28/2020   Breast cancer (HCC) 05/15/2020   Malignant neoplasm of upper-outer quadrant of right breast in female, estrogen receptor negative (HCC) 04/28/2020   History of breast cancer 10/18/2019   Pelvic pressure in female 10/18/2019   Left lower quadrant abdominal tenderness without rebound tenderness 10/18/2019   Vulvar itching 10/18/2019   Screening for colorectal cancer 10/18/2019   Invasive ductal carcinoma of left breast (HCC) 01/27/2014   Diabetes mellitus due to underlying condition without complications (HCC) 12/24/2013   Hypertension, essential, benign 12/24/2013    PCP: Carylon Perches MD  REFERRING PROVIDER: Eldred Manges, MD  REFERRING DIAG: M54.50,G89.29 (ICD-10-CM) - Chronic bilateral low back pain, unspecified whether sciatica present  Rationale for Evaluation and Treatment: Rehabilitation  THERAPY DIAG:  Other low back pain  Muscle weakness (generalized)  Other abnormalities of gait and mobility  Other symptoms and signs involving the musculoskeletal system  Cervicalgia  Abnormal posture  ONSET DATE: chronic  SUBJECTIVE:  SUBJECTIVE STATEMENT: Pt states soreness in low back and was sore after last session. Feels she is getting stronger.  Evaluation: Patient states neck and low back pain. She did some self traction at home with water device. Been using tens on neck and massage tool. Back doesn't bother her with sitting. Neck bothers her all the time. Had  back surgery and was alright for a while. PT helped her last time she was in. She eventually stopped doing HEP provided. Can't sit very long due to symptoms. Patient states low back pain and symptoms into R leg burning to mid calf region. Tylenol and gabapentin seem to help and lidocaine. Wants to work on her neck and back issues. Hasn't been able to sleep in bed in over a year until recently getting an adjustable bed and has been sleeping in it for a week.  PERTINENT HISTORY:  Hx of interspinous device at L5-S1 later fusion at L4-5 with pedicle screws on the right side only, hx breast cancer, DM, HTN, neck pain, chronic LBP  PAIN:  Are you having pain? Yes: NPRS scale: 0/10 Pain location: low back and neck Pain description: burning Aggravating factors: sitting Relieving factors: Tylenol and gabapentin, lidocaine  PRECAUTIONS: Fall  WEIGHT BEARING RESTRICTIONS: No  FALLS:  Has patient fallen in last 6 months? No  OCCUPATION: Retired  PLOF: Independent  PATIENT GOALS: to get better  NEXT MD VISIT: next week  OBJECTIVE:   OBSERVATION: lateral lean to R in sitting most of session   DIAGNOSTIC FINDINGS:  XR 09/08/22 [facility] lateral lumbar spine images are obtained and reviewed this shows right  pedicle instrumentation with cage at L4-5.  Interspinous device L5-S1.   Scoliosis curve approximately 30 degrees right lumbar apex at L2-3  junction.  Considerable facet arthropathy noted.  No compression  fractures.  Endplate sclerosis at L2-3 L3-4.   Impression: Previous fusion as described above.  Scoliosis with  degenerative changes.   PATIENT SURVEYS: FOTO 38% function  SCREENING FOR RED FLAGS: Bowel or bladder incontinence: No Spinal tumors: No Cauda equina syndrome: No Compression fracture: No Abdominal aneurysm: No  COGNITION: Overall cognitive status: Within functional limits for tasks assessed     SENSATION: WFL  POSTURE: rounded shoulders, forward head, and R lateral  lean  and trunk flexion due to  scoliosis  PALPATION: TTP L cervical paraspinals, R lumbar paraspinals, glutes  LUMBAR ROM:   AROM eval  Flexion 0% limited *  Extension 75% limited  Right lateral flexion 25% limited  Left lateral flexion 50% limited  Right rotation 90% limited  Left rotation 90%limited   (Blank rows = not tested) *= pain, rotation mostly through shoulders/thoracic spine minimal thoraco/lumbar Cervical  ROM:   AROM eval  Flexion 0% limited *  Extension 25% limited  Right lateral flexion 50% limited  Left lateral flexion 50% limited  Right rotation 25% limited  Left rotation 75% limited   (Blank rows = not tested) *= pain, L sided neck pain in UT region  LOWER EXTREMITY ROM:   WFL for tasks assessed  Active  Right eval Left eval  Hip flexion    Hip extension    Hip abduction    Hip adduction    Hip internal rotation    Hip external rotation    Knee flexion    Knee extension    Ankle dorsiflexion    Ankle plantarflexion    Ankle inversion    Ankle eversion     (Blank rows = not  tested)  LOWER EXTREMITY MMT:    MMT Right eval Left eval  Hip flexion 4 4+  Hip extension    Hip abduction    Hip adduction    Hip internal rotation    Hip external rotation    Knee flexion 5 5  Knee extension 5 5  Ankle dorsiflexion 5 5  Ankle plantarflexion    Ankle inversion    Ankle eversion     (Blank rows = not tested)    FUNCTIONAL TESTS:  5 times sit to stand: 25.22 seconds without UE support for 3 reps, UE on thighs for 2 reps. Relies LLE>RLE 2 minute walk test: 195 feet  GAIT: Distance walked: 195 feet Assistive device utilized: None Level of assistance: Modified independence Comments: , trunk R lateral lean and flexion, decreased stride length, decreased trunk/UE rotation  TODAY'S TREATMENT:                                                                                                                              DATE:  11/02/22 STM to  L UT, levator scapulae Self STM with SPC to L UT, levator scapulae - education and performance Seated cervical retractions 2 x 10  Seated scap retractions 2 x 10  Upper trap stretch 3 x 20 seconds bilateral  Standing row RTB 2 x 15 Standing shoulder extension RTB 2 x 15 Scapular retraction with GH ER RTB 2 x 10  Nustep  Level 2 seat 9, 5 minutes for conditioning  10/31/22 Seated cervical retractions 2 x 10  Seated scap retractions 2 x 10  Upper trap stretch 3 x 20 seconds bilateral  Standing row RTB 2 x 10 Standing shoulder extension RTB 2 x 10 Scapular retraction with GH ER RTB 1 x 10  Standing alternating marches 1 x 10 bilateral Standing hip abduction 1 x 10 bilateral Standing hip extension 1 x 10 bilateral Lateral stepping 5 x 10 feet bilateral Shoulder horizontal abduction RTB 1 x 10  Nustep  Level 2 seat 9, 5 minutes for conditioning  10/26/22 Seated cervical retraction 10X5"  Scapular retractions 10X5"  UE flexion following movements 10X  RTB rows 2X10  RTB retractions 2X10  RTB extensions 2X10 Soft tissue massage to Lt cervical region/trap/levators  10/24/22 Seated:  cervical retraction 10X5"  Scapular retraction 10X5  Thoracic excursions with UE movements 5X each side Supine: Bridge 2X10 Trunk rotations 10X10" each Seated:  Red theraband scap retr 10X  RTB rows 10X  RTB extensions 10X  10/19/22 Supine: log roll to/from supine Decompression 2-5 10x 3" Decompression with RTB 5X each Seated:  cervical retraction 10X5"  Scapular retraction 10X5 Standing:  Red theraband scap retr 10X  RTB rows 10X  RTB extensions 10X   PATIENT EDUCATION:  Education details: 5/20: HEP;  Eval: Patient educated on exam findings, POC, scope of PT, HEP, and posture/scoliosis. Person educated: Patient Education method: Explanation, Demonstration, and Handouts Education comprehension: verbalized understanding, returned demonstration,  verbal cues required, and tactile cues  required  HOME EXERCISE PROGRAM: Access Code: [email or web address] URL: [email or web address]  10/31/22 - Shoulder External Rotation and Scapular Retraction with Resistance  - 1 x daily - 7 x weekly - 2 sets - 10 reps - Standing March with Counter Support  - 1 x daily - 7 x weekly - 2 sets - 10 reps - Standing Hip Abduction with Counter Support  - 1 x daily - 7 x weekly - 2 sets - 10 reps  Date: 10/19/2022 - Scapular Retraction with Resistance  - 2 x daily - 7 x weekly - 2 sets - 10 reps - Scapular Retraction with Resistance Advanced  - 2 x daily - 7 x weekly - 2 sets - 10 reps Decompression 1-4 with theraband  10/11/22:  decompression  Date: 09/29/2022 Prepared by: Emeline Gins Exercises - Correct Seated Posture  - 3 x daily - 7 x weekly - 2 sets - 10 reps - Seated Scapular Retraction with External Rotation  - 2 x daily - 7 x weekly - 1 sets - 10 reps - 5 sec hold - Sit to Stand  - 2 x daily - 7 x weekly - 1 sets - 10 reps - Beginner Bridge  - 2 x daily - 7 x weekly - 1 sets - 10 reps - Supine Transversus Abdominis Bracing - Hands on Stomach  - 2 x daily - 7 x weekly - 1 sets - 10 reps - 5 sec hold - Small Range Straight Leg Raise  - 1 x daily - 7 x weekly - 1 sets - 10 reps  Date: 09/27/2022 - Correct Seated Posture  - 3 x daily - 7 x weekly - 2 sets - 10 reps  ASSESSMENT:  CLINICAL IMPRESSION: Patient with tenderness in L UT, decrease in tissue tension with STM. Continued with cervical mobility and postural strengthening which is tolerated well with additional reps as able. Moderate to high fatigue requiring intermittent rest breaks.  Performed nustep at end of session for conditioning and endurance. Patient will continue to benefit from physical therapy in order to improve function and reduce impairment.   OBJECTIVE IMPAIRMENTS: Abnormal gait, decreased activity tolerance, decreased balance, decreased endurance, decreased mobility, difficulty walking, decreased ROM, decreased  strength, hypomobility, increased muscle spasms, impaired flexibility, improper body mechanics, postural dysfunction, and pain.   ACTIVITY LIMITATIONS: carrying, lifting, bending, standing, squatting, stairs, transfers, reach over head, locomotion level, and caring for others  PARTICIPATION LIMITATIONS: meal prep, cleaning, laundry, shopping, community activity, and yard work  PERSONAL FACTORS: Age, Time since onset of injury/illness/exacerbation, and 3+ comorbidities:  hx LBP/surgery, hx breast cancer, DM, HTN, neck pain, chronic LBP, scoliosis  are also affecting patient's functional outcome.   REHAB POTENTIAL: Fair    CLINICAL DECISION MAKING: Stable/uncomplicated  EVALUATION COMPLEXITY: Low   GOALS: Goals reviewed with patient? Yes  SHORT TERM GOALS: Target date: 10/18/2022    Patient will be independent with HEP in order to improve functional outcomes. Baseline:  Goal status: IN PROGRESS  2.  Patient will report at least 25% improvement in symptoms for improved quality of life. Baseline:  Goal status: IN PROGRESS   LONG TERM GOALS: Target date: 11/08/2022    Patient will report at least 75% improvement in symptoms for improved quality of life. Baseline:  Goal status: IN PROGRESS  2.  Patient will improve FOTO score by at least 10 points in order to indicate improved tolerance to activity. Baseline: 38% function Goal status: IN  PROGRESS  3.  Patient will demonstrate at least 25% improvement in thoracolumbar and cerivcal ROM in all restricted planes for improved ability to move trunk and neck while completing chores. Baseline: see above Goal status: IN PROGRESS  4.  Patient will be able to ambulate at least 250 feet in in order to demonstrate improved tolerance to activity. Baseline: 195 feet Goal status: IN PROGRESS  5.  Patient will be able to complete 5x STS in under 11.4 seconds in order to reduce the risk of falls and demo improved LE strength for household  activities. Baseline: 25.22 seconds without UE support for 3 reps, UE on thighs for 2 reps. Relies LLE>RLE Goal status: IN PROGRESS     PLAN:  PT FREQUENCY: 2x/week  PT DURATION: 6 weeks  PLANNED INTERVENTIONS: Therapeutic exercises, Therapeutic activity, Neuromuscular re-education, Balance training, Gait training, Patient/Family education, Joint manipulation, Joint mobilization, Stair training, Orthotic/Fit training, DME instructions, Aquatic Therapy, Dry Needling, Electrical stimulation, Spinal manipulation, Spinal mobilization, Cryotherapy, Moist heat, Compression bandaging, scar mobilization, Splintting, Taping, Traction, Ultrasound, Ionotophoresis 4mg /ml Dexamethasone, and Manual therapy  PLAN FOR NEXT SESSION: core, hip, postural, and functional strengthening; spinal mobility, posture.     Reola Mosher Detrick Dani, PT 11/02/2022, 10:32 AM

## 2022-11-08 ENCOUNTER — Ambulatory Visit (HOSPITAL_COMMUNITY): Payer: Medicare Other

## 2022-11-08 DIAGNOSIS — M6281 Muscle weakness (generalized): Secondary | ICD-10-CM | POA: Diagnosis not present

## 2022-11-08 DIAGNOSIS — R29898 Other symptoms and signs involving the musculoskeletal system: Secondary | ICD-10-CM | POA: Diagnosis not present

## 2022-11-08 DIAGNOSIS — R2689 Other abnormalities of gait and mobility: Secondary | ICD-10-CM | POA: Diagnosis not present

## 2022-11-08 DIAGNOSIS — M542 Cervicalgia: Secondary | ICD-10-CM

## 2022-11-08 DIAGNOSIS — M5459 Other low back pain: Secondary | ICD-10-CM | POA: Diagnosis not present

## 2022-11-08 DIAGNOSIS — R293 Abnormal posture: Secondary | ICD-10-CM | POA: Diagnosis not present

## 2022-11-08 DIAGNOSIS — M5412 Radiculopathy, cervical region: Secondary | ICD-10-CM | POA: Diagnosis not present

## 2022-11-08 NOTE — Therapy (Signed)
OUTPATIENT PHYSICAL THERAPY TREATMENT  Patient Name: Christine Sutton MRN: [id number] DOB:1929/09/15, 89 y.o., female Today's Date: 11/08/2022  END OF SESSION:  PT End of Session - 11/08/22 1039     Visit Number 10    Number of Visits 14    Date for PT Re-Evaluation 11/17/22    Authorization Type Medicare/BCBS    Progress Note Due on Visit 19    PT Start Time 1035    PT Stop Time 1115    PT Time Calculation (min) 40 min    Activity Tolerance Patient tolerated treatment well    Behavior During Therapy WFL for tasks assessed/performed               Past Medical History:  Diagnosis Date   Breast disorder    Diabetes mellitus (HCC)    Family history of breast cancer    Family history of colon cancer    Family history of lung cancer    High blood cholesterol level    HX: breast cancer    left   Hypertension    Invasive ductal carcinoma of left breast (HCC) 01/27/2014   ER +   Past Surgical History:  Procedure Laterality Date   BACK SURGERY     lower   CATARACT EXTRACTION, BILATERAL  11/2014   COLONOSCOPY WITH PROPOFOL N/A 01/18/2022   Procedure: COLONOSCOPY WITH PROPOFOL;  Surgeon: Dolores Frame, MD;  Location: [facility];  Service: Gastroenterology;  Laterality: N/A;   ESOPHAGOGASTRODUODENOSCOPY (EGD) WITH PROPOFOL N/A 01/18/2022   Procedure: ESOPHAGOGASTRODUODENOSCOPY (EGD) WITH PROPOFOL;  Surgeon: Dolores Frame, MD;  Location: [facility];  Service: Gastroenterology;  Laterality: N/A;   MASTECTOMY Left    SENTINEL NODE BIOPSY Right 05/15/2020   Procedure: SENTINEL NODE BIOPSY;  Surgeon: Lucretia Roers, MD;  Location: [facility];  Service: General;  Laterality: Right;   TOTAL MASTECTOMY Right 05/15/2020   Procedure: TOTAL MASTECTOMY RIGHT BREAST;  Surgeon: Lucretia Roers, MD;  Location: [facility];  Service: General;  Laterality: Right;   Patient Active Problem List   Diagnosis Date Noted   Blood loss anemia 01/16/2022   AKI (acute kidney  injury) (HCC) 01/16/2022   GI bleed 01/15/2022   History of rectal bleeding 05/04/2021   Other spondylosis with radiculopathy, cervical region 01/14/2021   Foraminal stenosis of cervical region 01/14/2021   Family history of breast cancer    Family history of colon cancer    Family history of lung cancer    Genetic testing 06/28/2020   Breast cancer (HCC) 05/15/2020   Malignant neoplasm of upper-outer quadrant of right breast in female, estrogen receptor negative (HCC) 04/28/2020   History of breast cancer 10/18/2019   Pelvic pressure in female 10/18/2019   Left lower quadrant abdominal tenderness without rebound tenderness 10/18/2019   Vulvar itching 10/18/2019   Screening for colorectal cancer 10/18/2019   Invasive ductal carcinoma of left breast (HCC) 01/27/2014   Diabetes mellitus due to underlying condition without complications (HCC) 12/24/2013   Hypertension, essential, benign 12/24/2013    PCP: Carylon Perches MD  REFERRING PROVIDER: Eldred Manges, MD  REFERRING DIAG: M54.50,G89.29 (ICD-10-CM) - Chronic bilateral low back pain, unspecified whether sciatica present  Rationale for Evaluation and Treatment: Rehabilitation  THERAPY DIAG:  Other low back pain  Muscle weakness (generalized)  Other abnormalities of gait and mobility  Other symptoms and signs involving the musculoskeletal system  Cervicalgia  ONSET DATE: chronic  SUBJECTIVE:  SUBJECTIVE STATEMENT: Overall is sore today; reports she is about 50% better since starting therapy.  She states she is definitely stronger; compliant with exercises.  Her neck is better  Evaluation: Patient states neck and low back pain. She did some self traction at home with water device. Been using tens on neck and massage tool. Back doesn't bother her  with sitting. Neck bothers her all the time. Had back surgery and was alright for a while. PT helped her last time she was in. She eventually stopped doing HEP provided. Can't sit very long due to symptoms. Patient states low back pain and symptoms into R leg burning to mid calf region. Tylenol and gabapentin seem to help and lidocaine. Wants to work on her neck and back issues. Hasn't been able to sleep in bed in over a year until recently getting an adjustable bed and has been sleeping in it for a week.  PERTINENT HISTORY:  Hx of interspinous device at L5-S1 later fusion at L4-5 with pedicle screws on the right side only, hx breast cancer, DM, HTN, neck pain, chronic LBP  PAIN:  Are you having pain? Yes: NPRS scale: 0/10 Pain location: low back and neck Pain description: burning Aggravating factors: sitting Relieving factors: Tylenol and gabapentin, lidocaine  PRECAUTIONS: Fall  WEIGHT BEARING RESTRICTIONS: No  FALLS:  Has patient fallen in last 6 months? No  OCCUPATION: Retired  PLOF: Independent  PATIENT GOALS: to get better  NEXT MD VISIT: next week  OBJECTIVE:   OBSERVATION: lateral lean to R in sitting most of session   DIAGNOSTIC FINDINGS:  XR 09/08/22 [facility] lateral lumbar spine images are obtained and reviewed this shows right  pedicle instrumentation with cage at L4-5.  Interspinous device L5-S1.   Scoliosis curve approximately 30 degrees right lumbar apex at L2-3  junction.  Considerable facet arthropathy noted.  No compression  fractures.  Endplate sclerosis at L2-3 L3-4.   Impression: Previous fusion as described above.  Scoliosis with  degenerative changes.   PATIENT SURVEYS: FOTO 38% function  SCREENING FOR RED FLAGS: Bowel or bladder incontinence: No Spinal tumors: No Cauda equina syndrome: No Compression fracture: No Abdominal aneurysm: No  COGNITION: Overall cognitive status: Within functional limits for tasks  assessed     SENSATION: WFL  POSTURE: rounded shoulders, forward head, and R lateral lean  and trunk flexion due to  scoliosis  PALPATION: TTP L cervical paraspinals, R lumbar paraspinals, glutes  LUMBAR ROM:   AROM eval 11/08/22  Flexion 0% limited *   Extension 75% limited 65% limited  Right lateral flexion 25% limited 20% limited  Left lateral flexion 50% limited 40% limited  Right rotation 90% limited 60% limited  Left rotation 90%limited 60% limited   (Blank rows = not tested) *= pain, rotation mostly through shoulders/thoracic spine minimal thoraco/lumbar Cervical  ROM:   AROM eval 11/08/22  Flexion 0% limited *   Extension 25% limited   Right lateral flexion 50% limited 50% limited  Left lateral flexion 50% limited 50% limited  Right rotation 25% limited   Left rotation 75% limited 50% limited   (Blank rows = not tested) *= pain, L sided neck pain in UT region  LOWER EXTREMITY ROM:   WFL for tasks assessed  Active  Right eval Left eval  Hip flexion    Hip extension    Hip abduction    Hip adduction    Hip internal rotation    Hip external rotation    Knee flexion  Knee extension    Ankle dorsiflexion    Ankle plantarflexion    Ankle inversion    Ankle eversion     (Blank rows = not tested)  LOWER EXTREMITY MMT:    MMT Right eval Left eval Right 11/08/22 Left 11/08/22  Hip flexion 4 4+ 4+ 5  Hip extension      Hip abduction      Hip adduction      Hip internal rotation      Hip external rotation      Knee flexion 5 5    Knee extension 5 5    Ankle dorsiflexion 5 5    Ankle plantarflexion      Ankle inversion      Ankle eversion       (Blank rows = not tested)    FUNCTIONAL TESTS:  5 times sit to stand: 25.22 seconds without UE support for 3 reps, UE on thighs for 2 reps. Relies LLE>RLE 2 minute walk test: 195 feet  GAIT: Distance walked: 195 feet Assistive device utilized: None Level of assistance: Modified independence Comments:  , trunk R lateral lean and flexion, decreased stride length, decreased trunk/UE rotation  TODAY'S TREATMENT:                                                                                                                              DATE:  11/08/22 Progress note 5 times sit to stand 27.98 without use of UE to assist 2 MWT 297 ft FOTO 53 MMT's and AROM see above  STM to L UT, levator scapulae x 10' to decrease pain and increase soft tissue extensibility; no other treatment performed during STM today   11/02/22 STM to L UT, levator scapulae Self STM with SPC to L UT, levator scapulae - education and performance Seated cervical retractions 2 x 10  Seated scap retractions 2 x 10  Upper trap stretch 3 x 20 seconds bilateral  Standing row RTB 2 x 15 Standing shoulder extension RTB 2 x 15 Scapular retraction with GH ER RTB 2 x 10  Nustep  Level 2 seat 9, 5 minutes for conditioning  10/31/22 Seated cervical retractions 2 x 10  Seated scap retractions 2 x 10  Upper trap stretch 3 x 20 seconds bilateral  Standing row RTB 2 x 10 Standing shoulder extension RTB 2 x 10 Scapular retraction with GH ER RTB 1 x 10  Standing alternating marches 1 x 10 bilateral Standing hip abduction 1 x 10 bilateral Standing hip extension 1 x 10 bilateral Lateral stepping 5 x 10 feet bilateral Shoulder horizontal abduction RTB 1 x 10  Nustep  Level 2 seat 9, 5 minutes for conditioning  10/26/22 Seated cervical retraction 10X5"  Scapular retractions 10X5"  UE flexion following movements 10X  RTB rows 2X10  RTB retractions 2X10  RTB extensions 2X10 Soft tissue massage to Lt cervical region/trap/levators  10/24/22 Seated:  cervical retraction 10X5"  Scapular retraction  10X5  Thoracic excursions with UE movements 5X each side Supine: Bridge 2X10 Trunk rotations 10X10" each Seated:  Red theraband scap retr 10X  RTB rows 10X  RTB extensions 10X  10/19/22 Supine: log roll to/from  supine Decompression 2-5 10x 3" Decompression with RTB 5X each Seated:  cervical retraction 10X5"  Scapular retraction 10X5 Standing:  Red theraband scap retr 10X  RTB rows 10X  RTB extensions 10X   PATIENT EDUCATION:  Education details: 5/20: HEP;  Eval: Patient educated on exam findings, POC, scope of PT, HEP, and posture/scoliosis. Person educated: Patient Education method: Explanation, Demonstration, and Handouts Education comprehension: verbalized understanding, returned demonstration, verbal cues required, and tactile cues required  HOME EXERCISE PROGRAM: Access Code: [email or web address] URL: [email or web address]  10/31/22 - Shoulder External Rotation and Scapular Retraction with Resistance  - 1 x daily - 7 x weekly - 2 sets - 10 reps - Standing March with Counter Support  - 1 x daily - 7 x weekly - 2 sets - 10 reps - Standing Hip Abduction with Counter Support  - 1 x daily - 7 x weekly - 2 sets - 10 reps  Date: 10/19/2022 - Scapular Retraction with Resistance  - 2 x daily - 7 x weekly - 2 sets - 10 reps - Scapular Retraction with Resistance Advanced  - 2 x daily - 7 x weekly - 2 sets - 10 reps Decompression 1-4 with theraband  10/11/22:  decompression  Date: 09/29/2022 Prepared by: Emeline Gins Exercises - Correct Seated Posture  - 3 x daily - 7 x weekly - 2 sets - 10 reps - Seated Scapular Retraction with External Rotation  - 2 x daily - 7 x weekly - 1 sets - 10 reps - 5 sec hold - Sit to Stand  - 2 x daily - 7 x weekly - 1 sets - 10 reps - Beginner Bridge  - 2 x daily - 7 x weekly - 1 sets - 10 reps - Supine Transversus Abdominis Bracing - Hands on Stomach  - 2 x daily - 7 x weekly - 1 sets - 10 reps - 5 sec hold - Small Range Straight Leg Raise  - 1 x daily - 7 x weekly - 1 sets - 10 reps  Date: 09/27/2022 - Correct Seated Posture  - 3 x daily - 7 x weekly - 2 sets - 10 reps  ASSESSMENT:  CLINICAL IMPRESSION: Progress note today; good improvement with 2 MWT  and with FOTO; met goals for both; has met 2/2 STG's and 2/5 LTG's.   Patient will continue to benefit from physical therapy in order to improve function and reduce impairment.   OBJECTIVE IMPAIRMENTS: Abnormal gait, decreased activity tolerance, decreased balance, decreased endurance, decreased mobility, difficulty walking, decreased ROM, decreased strength, hypomobility, increased muscle spasms, impaired flexibility, improper body mechanics, postural dysfunction, and pain.   ACTIVITY LIMITATIONS: carrying, lifting, bending, standing, squatting, stairs, transfers, reach over head, locomotion level, and caring for others  PARTICIPATION LIMITATIONS: meal prep, cleaning, laundry, shopping, community activity, and yard work  PERSONAL FACTORS: Age, Time since onset of injury/illness/exacerbation, and 3+ comorbidities:  hx LBP/surgery, hx breast cancer, DM, HTN, neck pain, chronic LBP, scoliosis  are also affecting patient's functional outcome.   REHAB POTENTIAL: Fair    CLINICAL DECISION MAKING: Stable/uncomplicated  EVALUATION COMPLEXITY: Low   GOALS: Goals reviewed with patient? Yes  SHORT TERM GOALS: Target date: 10/18/2022    Patient will be independent with HEP in order to improve functional outcomes.  Baseline:  Goal status: MET  2.  Patient will report at least 25% improvement in symptoms for improved quality of life. Baseline:  Goal status: MET   LONG TERM GOALS: Target date: 11/08/2022    Patient will report at least 75% improvement in symptoms for improved quality of life. Baseline:  Goal status: IN PROGRESS  2.  Patient will improve FOTO score by at least 10 points in order to indicate improved tolerance to activity. Baseline: 38% function; 53 11/08/23 Goal status: MET  3.  Patient will demonstrate at least 25% improvement in thoracolumbar and cerivcal ROM in all restricted planes for improved ability to move trunk and neck while completing chores. Baseline: see  above Goal status: IN PROGRESS  4.  Patient will be able to ambulate at least 250 feet in in order to demonstrate improved tolerance to activity. Baseline: 195 feet; 297 ft 11/08/22 Goal status: MET  5.  Patient will be able to complete 5x STS in under 11.4 seconds in order to reduce the risk of falls and demo improved LE strength for household activities. Baseline: 25.22 seconds without UE support for 3 reps, UE on thighs for 2 reps. Relies LLE>RLE Goal status: IN PROGRESS     PLAN:  PT FREQUENCY: 2x/week  PT DURATION: 6 weeks  PLANNED INTERVENTIONS: Therapeutic exercises, Therapeutic activity, Neuromuscular re-education, Balance training, Gait training, Patient/Family education, Joint manipulation, Joint mobilization, Stair training, Orthotic/Fit training, DME instructions, Aquatic Therapy, Dry Needling, Electrical stimulation, Spinal manipulation, Spinal mobilization, Cryotherapy, Moist heat, Compression bandaging, scar mobilization, Splintting, Taping, Traction, Ultrasound, Ionotophoresis 4mg /ml Dexamethasone, and Manual therapy  PLAN FOR NEXT SESSION: core, hip, postural, and functional strengthening; spinal mobility, posture.    11:16 AM, 11/08/22 Euna Armon Small Calene Paradiso MPT [facility] [place] [phone]

## 2022-11-10 ENCOUNTER — Ambulatory Visit (HOSPITAL_COMMUNITY): Payer: Medicare Other

## 2022-11-10 DIAGNOSIS — R29898 Other symptoms and signs involving the musculoskeletal system: Secondary | ICD-10-CM

## 2022-11-10 DIAGNOSIS — R2689 Other abnormalities of gait and mobility: Secondary | ICD-10-CM | POA: Diagnosis not present

## 2022-11-10 DIAGNOSIS — M542 Cervicalgia: Secondary | ICD-10-CM

## 2022-11-10 DIAGNOSIS — M5412 Radiculopathy, cervical region: Secondary | ICD-10-CM | POA: Diagnosis not present

## 2022-11-10 DIAGNOSIS — R293 Abnormal posture: Secondary | ICD-10-CM | POA: Diagnosis not present

## 2022-11-10 DIAGNOSIS — M5459 Other low back pain: Secondary | ICD-10-CM

## 2022-11-10 DIAGNOSIS — M6281 Muscle weakness (generalized): Secondary | ICD-10-CM

## 2022-11-10 NOTE — Therapy (Signed)
OUTPATIENT PHYSICAL THERAPY TREATMENT  Patient Name: Christine Sutton MRN: [id number] DOB:February 12, 1930, 89 y.o., female Today's Date: 11/10/2022  END OF SESSION:  PT End of Session - 11/10/22 1122     Visit Number 11    Number of Visits 14    Date for PT Re-Evaluation 11/17/22    Authorization Type Medicare/BCBS    Progress Note Due on Visit 19    PT Start Time 1118    PT Stop Time 1158    PT Time Calculation (min) 40 min    Activity Tolerance Patient tolerated treatment well    Behavior During Therapy WFL for tasks assessed/performed               Past Medical History:  Diagnosis Date   Breast disorder    Diabetes mellitus (HCC)    Family history of breast cancer    Family history of colon cancer    Family history of lung cancer    High blood cholesterol level    HX: breast cancer    left   Hypertension    Invasive ductal carcinoma of left breast (HCC) 01/27/2014   ER +   Past Surgical History:  Procedure Laterality Date   BACK SURGERY     lower   CATARACT EXTRACTION, BILATERAL  11/2014   COLONOSCOPY WITH PROPOFOL N/A 01/18/2022   Procedure: COLONOSCOPY WITH PROPOFOL;  Surgeon: Dolores Frame, MD;  Location: [facility];  Service: Gastroenterology;  Laterality: N/A;   ESOPHAGOGASTRODUODENOSCOPY (EGD) WITH PROPOFOL N/A 01/18/2022   Procedure: ESOPHAGOGASTRODUODENOSCOPY (EGD) WITH PROPOFOL;  Surgeon: Dolores Frame, MD;  Location: [facility];  Service: Gastroenterology;  Laterality: N/A;   MASTECTOMY Left    SENTINEL NODE BIOPSY Right 05/15/2020   Procedure: SENTINEL NODE BIOPSY;  Surgeon: Lucretia Roers, MD;  Location: [facility];  Service: General;  Laterality: Right;   TOTAL MASTECTOMY Right 05/15/2020   Procedure: TOTAL MASTECTOMY RIGHT BREAST;  Surgeon: Lucretia Roers, MD;  Location: [facility];  Service: General;  Laterality: Right;   Patient Active Problem List   Diagnosis Date Noted   Blood loss anemia 01/16/2022   AKI (acute kidney  injury) (HCC) 01/16/2022   GI bleed 01/15/2022   History of rectal bleeding 05/04/2021   Other spondylosis with radiculopathy, cervical region 01/14/2021   Foraminal stenosis of cervical region 01/14/2021   Family history of breast cancer    Family history of colon cancer    Family history of lung cancer    Genetic testing 06/28/2020   Breast cancer (HCC) 05/15/2020   Malignant neoplasm of upper-outer quadrant of right breast in female, estrogen receptor negative (HCC) 04/28/2020   History of breast cancer 10/18/2019   Pelvic pressure in female 10/18/2019   Left lower quadrant abdominal tenderness without rebound tenderness 10/18/2019   Vulvar itching 10/18/2019   Screening for colorectal cancer 10/18/2019   Invasive ductal carcinoma of left breast (HCC) 01/27/2014   Diabetes mellitus due to underlying condition without complications (HCC) 12/24/2013   Hypertension, essential, benign 12/24/2013    PCP: Carylon Perches MD  REFERRING PROVIDER: Eldred Manges, MD  REFERRING DIAG: M54.50,G89.29 (ICD-10-CM) - Chronic bilateral low back pain, unspecified whether sciatica present  Rationale for Evaluation and Treatment: Rehabilitation  THERAPY DIAG:  Other low back pain  Muscle weakness (generalized)  Cervicalgia  Other abnormalities of gait and mobility  Other symptoms and signs involving the musculoskeletal system  ONSET DATE: chronic  SUBJECTIVE:  SUBJECTIVE STATEMENT: Back stiff in the mornings; but loosens as the day progresses  Evaluation: Patient states neck and low back pain. She did some self traction at home with water device. Been using tens on neck and massage tool. Back doesn't bother her with sitting. Neck bothers her all the time. Had back surgery and was alright for a while. PT helped  her last time she was in. She eventually stopped doing HEP provided. Can't sit very long due to symptoms. Patient states low back pain and symptoms into R leg burning to mid calf region. Tylenol and gabapentin seem to help and lidocaine. Wants to work on her neck and back issues. Hasn't been able to sleep in bed in over a year until recently getting an adjustable bed and has been sleeping in it for a week.  PERTINENT HISTORY:  Hx of interspinous device at L5-S1 later fusion at L4-5 with pedicle screws on the right side only, hx breast cancer, DM, HTN, neck pain, chronic LBP  PAIN:  Are you having pain? Yes: NPRS scale: 0/10 Pain location: low back and neck Pain description: burning Aggravating factors: sitting Relieving factors: Tylenol and gabapentin, lidocaine  PRECAUTIONS: Fall  WEIGHT BEARING RESTRICTIONS: No  FALLS:  Has patient fallen in last 6 months? No  OCCUPATION: Retired  PLOF: Independent  PATIENT GOALS: to get better  NEXT MD VISIT: next week  OBJECTIVE:   OBSERVATION: lateral lean to R in sitting most of session   DIAGNOSTIC FINDINGS:  XR 09/08/22 [facility] lateral lumbar spine images are obtained and reviewed this shows right  pedicle instrumentation with cage at L4-5.  Interspinous device L5-S1.   Scoliosis curve approximately 30 degrees right lumbar apex at L2-3  junction.  Considerable facet arthropathy noted.  No compression  fractures.  Endplate sclerosis at L2-3 L3-4.   Impression: Previous fusion as described above.  Scoliosis with  degenerative changes.   PATIENT SURVEYS: FOTO 38% function  SCREENING FOR RED FLAGS: Bowel or bladder incontinence: No Spinal tumors: No Cauda equina syndrome: No Compression fracture: No Abdominal aneurysm: No  COGNITION: Overall cognitive status: Within functional limits for tasks assessed     SENSATION: WFL  POSTURE: rounded shoulders, forward head, and R lateral lean  and trunk flexion due to   scoliosis  PALPATION: TTP L cervical paraspinals, R lumbar paraspinals, glutes  LUMBAR ROM:   AROM eval 11/08/22  Flexion 0% limited *   Extension 75% limited 65% limited  Right lateral flexion 25% limited 20% limited  Left lateral flexion 50% limited 40% limited  Right rotation 90% limited 60% limited  Left rotation 90%limited 60% limited   (Blank rows = not tested) *= pain, rotation mostly through shoulders/thoracic spine minimal thoraco/lumbar Cervical  ROM:   AROM eval 11/08/22  Flexion 0% limited *   Extension 25% limited   Right lateral flexion 50% limited 50% limited  Left lateral flexion 50% limited 50% limited  Right rotation 25% limited   Left rotation 75% limited 50% limited   (Blank rows = not tested) *= pain, L sided neck pain in UT region  LOWER EXTREMITY ROM:   WFL for tasks assessed  Active  Right eval Left eval  Hip flexion    Hip extension    Hip abduction    Hip adduction    Hip internal rotation    Hip external rotation    Knee flexion    Knee extension    Ankle dorsiflexion    Ankle plantarflexion  Ankle inversion    Ankle eversion     (Blank rows = not tested)  LOWER EXTREMITY MMT:    MMT Right eval Left eval Right 11/08/22 Left 11/08/22  Hip flexion 4 4+ 4+ 5  Hip extension      Hip abduction      Hip adduction      Hip internal rotation      Hip external rotation      Knee flexion 5 5    Knee extension 5 5    Ankle dorsiflexion 5 5    Ankle plantarflexion      Ankle inversion      Ankle eversion       (Blank rows = not tested)    FUNCTIONAL TESTS:  5 times sit to stand: 25.22 seconds without UE support for 3 reps, UE on thighs for 2 reps. Relies LLE>RLE 2 minute walk test: 195 feet  GAIT: Distance walked: 195 feet Assistive device utilized: None Level of assistance: Modified independence Comments: , trunk R lateral lean and flexion, decreased stride length, decreased trunk/UE rotation  TODAY'S TREATMENT:                                                                                                                               DATE:  11/10/22 Nustep level 1 seat 9 arms 9 dynamic warm up  Standing: Heel/toe raises x 15 Hip abduction x 10 each Hip extension x 10 each GTB shoulder extension 2 x 10 GTB shoulder rows 2 x 10  Sitting: Cervical retractions x 10 STM to L UT, levator scapulae x 10' to decrease pain and increase soft tissue extensibility; no other treatment performed during STM today    11/08/22 Progress note 5 times sit to stand 27.98 without use of UE to assist 2 MWT 297 ft FOTO 53 MMT's and AROM see above  STM to L UT, levator scapulae x 10' to decrease pain and increase soft tissue extensibility; no other treatment performed during STM today   11/02/22 STM to L UT, levator scapulae Self STM with SPC to L UT, levator scapulae - education and performance Seated cervical retractions 2 x 10  Seated scap retractions 2 x 10  Upper trap stretch 3 x 20 seconds bilateral  Standing row RTB 2 x 15 Standing shoulder extension RTB 2 x 15 Scapular retraction with GH ER RTB 2 x 10  Nustep  Level 2 seat 9, 5 minutes for conditioning  10/31/22 Seated cervical retractions 2 x 10  Seated scap retractions 2 x 10  Upper trap stretch 3 x 20 seconds bilateral  Standing row RTB 2 x 10 Standing shoulder extension RTB 2 x 10 Scapular retraction with GH ER RTB 1 x 10  Standing alternating marches 1 x 10 bilateral Standing hip abduction 1 x 10 bilateral Standing hip extension 1 x 10 bilateral Lateral stepping 5 x 10 feet bilateral Shoulder horizontal abduction RTB 1 x 10  Nustep  Level 2 seat 9, 5 minutes for conditioning  10/26/22 Seated cervical retraction 10X5"  Scapular retractions 10X5"  UE flexion following movements 10X  RTB rows 2X10  RTB retractions 2X10  RTB extensions 2X10 Soft tissue massage to Lt cervical region/trap/levators  10/24/22 Seated:  cervical retraction  10X5"  Scapular retraction 10X5  Thoracic excursions with UE movements 5X each side Supine: Bridge 2X10 Trunk rotations 10X10" each Seated:  Red theraband scap retr 10X  RTB rows 10X  RTB extensions 10X  10/19/22 Supine: log roll to/from supine Decompression 2-5 10x 3" Decompression with RTB 5X each Seated:  cervical retraction 10X5"  Scapular retraction 10X5 Standing:  Red theraband scap retr 10X  RTB rows 10X  RTB extensions 10X   PATIENT EDUCATION:  Education details: 5/20: HEP;  Eval: Patient educated on exam findings, POC, scope of PT, HEP, and posture/scoliosis. Person educated: Patient Education method: Explanation, Demonstration, and Handouts Education comprehension: verbalized understanding, returned demonstration, verbal cues required, and tactile cues required  HOME EXERCISE PROGRAM: Access Code: [email or web address] URL: [email or web address]  10/31/22 - Shoulder External Rotation and Scapular Retraction with Resistance  - 1 x daily - 7 x weekly - 2 sets - 10 reps - Standing March with Counter Support  - 1 x daily - 7 x weekly - 2 sets - 10 reps - Standing Hip Abduction with Counter Support  - 1 x daily - 7 x weekly - 2 sets - 10 reps  Date: 10/19/2022 - Scapular Retraction with Resistance  - 2 x daily - 7 x weekly - 2 sets - 10 reps - Scapular Retraction with Resistance Advanced  - 2 x daily - 7 x weekly - 2 sets - 10 reps Decompression 1-4 with theraband  10/11/22:  decompression  Date: 09/29/2022 Prepared by: Emeline Gins Exercises - Correct Seated Posture  - 3 x daily - 7 x weekly - 2 sets - 10 reps - Seated Scapular Retraction with External Rotation  - 2 x daily - 7 x weekly - 1 sets - 10 reps - 5 sec hold - Sit to Stand  - 2 x daily - 7 x weekly - 1 sets - 10 reps - Beginner Bridge  - 2 x daily - 7 x weekly - 1 sets - 10 reps - Supine Transversus Abdominis Bracing - Hands on Stomach  - 2 x daily - 7 x weekly - 1 sets - 10 reps - 5 sec hold - Small Range  Straight Leg Raise  - 1 x daily - 7 x weekly - 1 sets - 10 reps  Date: 09/27/2022 - Correct Seated Posture  - 3 x daily - 7 x weekly - 2 sets - 10 reps  ASSESSMENT:  CLINICAL IMPRESSION: Today's session continued to focus on core strengthening and mobility; continues with noted forward flexed trunk but improves with cueing; some pelvis imbalance left hip higher than right noted likely due to spinal curvature.   Increased intensity of rows and extension with GTB today with good challenge.  Noted tightness left upper trap > right.  Patient will benefit from continued skilled therapy services to address deficits and promote return to optimal function.      OBJECTIVE IMPAIRMENTS: Abnormal gait, decreased activity tolerance, decreased balance, decreased endurance, decreased mobility, difficulty walking, decreased ROM, decreased strength, hypomobility, increased muscle spasms, impaired flexibility, improper body mechanics, postural dysfunction, and pain.   ACTIVITY LIMITATIONS: carrying, lifting, bending, standing, squatting, stairs, transfers, reach over head, locomotion level, and caring for  others  PARTICIPATION LIMITATIONS: meal prep, cleaning, laundry, shopping, community activity, and yard work  PERSONAL FACTORS: Age, Time since onset of injury/illness/exacerbation, and 3+ comorbidities:  hx LBP/surgery, hx breast cancer, DM, HTN, neck pain, chronic LBP, scoliosis  are also affecting patient's functional outcome.   REHAB POTENTIAL: Fair    CLINICAL DECISION MAKING: Stable/uncomplicated  EVALUATION COMPLEXITY: Low   GOALS: Goals reviewed with patient? Yes  SHORT TERM GOALS: Target date: 10/18/2022    Patient will be independent with HEP in order to improve functional outcomes. Baseline:  Goal status: MET  2.  Patient will report at least 25% improvement in symptoms for improved quality of life. Baseline:  Goal status: MET   LONG TERM GOALS: Target date: 11/08/2022    Patient  will report at least 75% improvement in symptoms for improved quality of life. Baseline:  Goal status: IN PROGRESS  2.  Patient will improve FOTO score by at least 10 points in order to indicate improved tolerance to activity. Baseline: 38% function; 53 11/08/23 Goal status: MET  3.  Patient will demonstrate at least 25% improvement in thoracolumbar and cerivcal ROM in all restricted planes for improved ability to move trunk and neck while completing chores. Baseline: see above Goal status: IN PROGRESS  4.  Patient will be able to ambulate at least 250 feet in in order to demonstrate improved tolerance to activity. Baseline: 195 feet; 297 ft 11/08/22 Goal status: MET  5.  Patient will be able to complete 5x STS in under 11.4 seconds in order to reduce the risk of falls and demo improved LE strength for household activities. Baseline: 25.22 seconds without UE support for 3 reps, UE on thighs for 2 reps. Relies LLE>RLE Goal status: IN PROGRESS     PLAN:  PT FREQUENCY: 2x/week  PT DURATION: 6 weeks  PLANNED INTERVENTIONS: Therapeutic exercises, Therapeutic activity, Neuromuscular re-education, Balance training, Gait training, Patient/Family education, Joint manipulation, Joint mobilization, Stair training, Orthotic/Fit training, DME instructions, Aquatic Therapy, Dry Needling, Electrical stimulation, Spinal manipulation, Spinal mobilization, Cryotherapy, Moist heat, Compression bandaging, scar mobilization, Splintting, Taping, Traction, Ultrasound, Ionotophoresis 4mg /ml Dexamethasone, and Manual therapy  PLAN FOR NEXT SESSION: core, hip, postural, and functional strengthening; spinal mobility, posture.    11:54 AM, 11/10/22 Nohelia Valenza Small Mykaila Blunck MPT [facility] [phone]

## 2022-11-15 ENCOUNTER — Ambulatory Visit (HOSPITAL_COMMUNITY): Payer: Medicare Other | Attending: Orthopaedic Surgery

## 2022-11-15 DIAGNOSIS — M6281 Muscle weakness (generalized): Secondary | ICD-10-CM | POA: Diagnosis not present

## 2022-11-15 DIAGNOSIS — R29898 Other symptoms and signs involving the musculoskeletal system: Secondary | ICD-10-CM | POA: Insufficient documentation

## 2022-11-15 DIAGNOSIS — R293 Abnormal posture: Secondary | ICD-10-CM | POA: Insufficient documentation

## 2022-11-15 DIAGNOSIS — R2689 Other abnormalities of gait and mobility: Secondary | ICD-10-CM | POA: Insufficient documentation

## 2022-11-15 DIAGNOSIS — M5459 Other low back pain: Secondary | ICD-10-CM | POA: Diagnosis not present

## 2022-11-15 DIAGNOSIS — M542 Cervicalgia: Secondary | ICD-10-CM | POA: Insufficient documentation

## 2022-11-15 NOTE — Therapy (Signed)
OUTPATIENT PHYSICAL THERAPY TREATMENT  Patient Name: Christine Sutton MRN: [id number] DOB:03/12/1930, 89 y.o., female Today's Date: 11/15/2022  END OF SESSION:  PT End of Session - 11/15/22 1034     Visit Number 12    Number of Visits 14    Date for PT Re-Evaluation 11/17/22    Authorization Type Medicare/BCBS    Progress Note Due on Visit 19    PT Start Time 1033    PT Stop Time 1113    PT Time Calculation (min) 40 min    Activity Tolerance Patient tolerated treatment well    Behavior During Therapy WFL for tasks assessed/performed               Past Medical History:  Diagnosis Date   Breast disorder    Diabetes mellitus (HCC)    Family history of breast cancer    Family history of colon cancer    Family history of lung cancer    High blood cholesterol level    HX: breast cancer    left   Hypertension    Invasive ductal carcinoma of left breast (HCC) 01/27/2014   ER +   Past Surgical History:  Procedure Laterality Date   BACK SURGERY     lower   CATARACT EXTRACTION, BILATERAL  11/2014   COLONOSCOPY WITH PROPOFOL N/A 01/18/2022   Procedure: COLONOSCOPY WITH PROPOFOL;  Surgeon: Dolores Frame, MD;  Location: [facility];  Service: Gastroenterology;  Laterality: N/A;   ESOPHAGOGASTRODUODENOSCOPY (EGD) WITH PROPOFOL N/A 01/18/2022   Procedure: ESOPHAGOGASTRODUODENOSCOPY (EGD) WITH PROPOFOL;  Surgeon: Dolores Frame, MD;  Location: [facility];  Service: Gastroenterology;  Laterality: N/A;   MASTECTOMY Left    SENTINEL NODE BIOPSY Right 05/15/2020   Procedure: SENTINEL NODE BIOPSY;  Surgeon: Lucretia Roers, MD;  Location: [facility];  Service: General;  Laterality: Right;   TOTAL MASTECTOMY Right 05/15/2020   Procedure: TOTAL MASTECTOMY RIGHT BREAST;  Surgeon: Lucretia Roers, MD;  Location: [facility];  Service: General;  Laterality: Right;   Patient Active Problem List   Diagnosis Date Noted   Blood loss anemia 01/16/2022   AKI (acute kidney  injury) (HCC) 01/16/2022   GI bleed 01/15/2022   History of rectal bleeding 05/04/2021   Other spondylosis with radiculopathy, cervical region 01/14/2021   Foraminal stenosis of cervical region 01/14/2021   Family history of breast cancer    Family history of colon cancer    Family history of lung cancer    Genetic testing 06/28/2020   Breast cancer (HCC) 05/15/2020   Malignant neoplasm of upper-outer quadrant of right breast in female, estrogen receptor negative (HCC) 04/28/2020   History of breast cancer 10/18/2019   Pelvic pressure in female 10/18/2019   Left lower quadrant abdominal tenderness without rebound tenderness 10/18/2019   Vulvar itching 10/18/2019   Screening for colorectal cancer 10/18/2019   Invasive ductal carcinoma of left breast (HCC) 01/27/2014   Diabetes mellitus due to underlying condition without complications (HCC) 12/24/2013   Hypertension, essential, benign 12/24/2013    PCP: Carylon Perches MD  REFERRING PROVIDER: Eldred Manges, MD  REFERRING DIAG: M54.50,G89.29 (ICD-10-CM) - Chronic bilateral low back pain, unspecified whether sciatica present  Rationale for Evaluation and Treatment: Rehabilitation  THERAPY DIAG:  Other low back pain  Muscle weakness (generalized)  Cervicalgia  Other abnormalities of gait and mobility  Other symptoms and signs involving the musculoskeletal system  Abnormal posture  ONSET DATE: chronic  SUBJECTIVE:  SUBJECTIVE STATEMENT: Back is "so much better"; her neck is still bothering her some 3/10 neck pain; able to go to church Sunday for the first time in a while  Evaluation: Patient states neck and low back pain. She did some self traction at home with water device. Been using tens on neck and massage tool. Back doesn't bother her with  sitting. Neck bothers her all the time. Had back surgery and was alright for a while. PT helped her last time she was in. She eventually stopped doing HEP provided. Can't sit very long due to symptoms. Patient states low back pain and symptoms into R leg burning to mid calf region. Tylenol and gabapentin seem to help and lidocaine. Wants to work on her neck and back issues. Hasn't been able to sleep in bed in over a year until recently getting an adjustable bed and has been sleeping in it for a week.  PERTINENT HISTORY:  Hx of interspinous device at L5-S1 later fusion at L4-5 with pedicle screws on the right side only, hx breast cancer, DM, HTN, neck pain, chronic LBP  PAIN:  Are you having pain? Yes: NPRS scale: 0/10 Pain location: low back and neck Pain description: burning Aggravating factors: sitting Relieving factors: Tylenol and gabapentin, lidocaine  PRECAUTIONS: Fall  WEIGHT BEARING RESTRICTIONS: No  FALLS:  Has patient fallen in last 6 months? No  OCCUPATION: Retired  PLOF: Independent  PATIENT GOALS: to get better  NEXT MD VISIT: next week  OBJECTIVE:   OBSERVATION: lateral lean to R in sitting most of session   DIAGNOSTIC FINDINGS:  XR 09/08/22 [facility] lateral lumbar spine images are obtained and reviewed this shows right  pedicle instrumentation with cage at L4-5.  Interspinous device L5-S1.   Scoliosis curve approximately 30 degrees right lumbar apex at L2-3  junction.  Considerable facet arthropathy noted.  No compression  fractures.  Endplate sclerosis at L2-3 L3-4.   Impression: Previous fusion as described above.  Scoliosis with  degenerative changes.   PATIENT SURVEYS: FOTO 38% function  SCREENING FOR RED FLAGS: Bowel or bladder incontinence: No Spinal tumors: No Cauda equina syndrome: No Compression fracture: No Abdominal aneurysm: No  COGNITION: Overall cognitive status: Within functional limits for tasks assessed     SENSATION: WFL  POSTURE:  rounded shoulders, forward head, and R lateral lean  and trunk flexion due to  scoliosis  PALPATION: TTP L cervical paraspinals, R lumbar paraspinals, glutes  LUMBAR ROM:   AROM eval 11/08/22  Flexion 0% limited *   Extension 75% limited 65% limited  Right lateral flexion 25% limited 20% limited  Left lateral flexion 50% limited 40% limited  Right rotation 90% limited 60% limited  Left rotation 90%limited 60% limited   (Blank rows = not tested) *= pain, rotation mostly through shoulders/thoracic spine minimal thoraco/lumbar Cervical  ROM:   AROM eval 11/08/22  Flexion 0% limited *   Extension 25% limited   Right lateral flexion 50% limited 50% limited  Left lateral flexion 50% limited 50% limited  Right rotation 25% limited   Left rotation 75% limited 50% limited   (Blank rows = not tested) *= pain, L sided neck pain in UT region  LOWER EXTREMITY ROM:   WFL for tasks assessed  Active  Right eval Left eval  Hip flexion    Hip extension    Hip abduction    Hip adduction    Hip internal rotation    Hip external rotation    Knee flexion  Knee extension    Ankle dorsiflexion    Ankle plantarflexion    Ankle inversion    Ankle eversion     (Blank rows = not tested)  LOWER EXTREMITY MMT:    MMT Right eval Left eval Right 11/08/22 Left 11/08/22  Hip flexion 4 4+ 4+ 5  Hip extension      Hip abduction      Hip adduction      Hip internal rotation      Hip external rotation      Knee flexion 5 5    Knee extension 5 5    Ankle dorsiflexion 5 5    Ankle plantarflexion      Ankle inversion      Ankle eversion       (Blank rows = not tested)    FUNCTIONAL TESTS:  5 times sit to stand: 25.22 seconds without UE support for 3 reps, UE on thighs for 2 reps. Relies LLE>RLE 2 minute walk test: 195 feet  GAIT: Distance walked: 195 feet Assistive device utilized: None Level of assistance: Modified independence Comments: , trunk R lateral lean and flexion,  decreased stride length, decreased trunk/UE rotation  TODAY'S TREATMENT:                                                                                                                              DATE:  11/15/22 Nustep level 2 seat 9 arms 9 dynamic warm up  Standing: Heel/toe raises x 10; on incline x 10 2# hip abduction 2 x 10 each 2# hip extension 2 x 10 each Sit to stand x 5 no UE assist  Sitting: Cervical retractions x 10 Cervical retraction with extension x 10  STM to L UT, levator scapulae x 10' to decrease pain and increase soft tissue extensibility; no other treatment performed during STM today  11/10/22 Nustep level 1 seat 9 arms 9 dynamic warm up  Standing: Heel/toe raises x 15 Hip abduction x 10 each Hip extension x 10 each GTB shoulder extension 2 x 10 GTB shoulder rows 2 x 10  Sitting: Cervical retractions x 10 STM to L UT, levator scapulae x 10' to decrease pain and increase soft tissue extensibility; no other treatment performed during STM today    11/08/22 Progress note 5 times sit to stand 27.98 without use of UE to assist 2 MWT 297 ft FOTO 53 MMT's and AROM see above  STM to L UT, levator scapulae x 10' to decrease pain and increase soft tissue extensibility; no other treatment performed during STM today   11/02/22 STM to L UT, levator scapulae Self STM with SPC to L UT, levator scapulae - education and performance Seated cervical retractions 2 x 10  Seated scap retractions 2 x 10  Upper trap stretch 3 x 20 seconds bilateral  Standing row RTB 2 x 15 Standing shoulder extension RTB 2 x 15 Scapular retraction with GH ER RTB 2 x  10  Nustep  Level 2 seat 9, 5 minutes for conditioning  10/31/22 Seated cervical retractions 2 x 10  Seated scap retractions 2 x 10  Upper trap stretch 3 x 20 seconds bilateral  Standing row RTB 2 x 10 Standing shoulder extension RTB 2 x 10 Scapular retraction with GH ER RTB 1 x 10  Standing alternating marches 1 x 10  bilateral Standing hip abduction 1 x 10 bilateral Standing hip extension 1 x 10 bilateral Lateral stepping 5 x 10 feet bilateral Shoulder horizontal abduction RTB 1 x 10  Nustep  Level 2 seat 9, 5 minutes for conditioning  10/26/22 Seated cervical retraction 10X5"  Scapular retractions 10X5"  UE flexion following movements 10X  RTB rows 2X10  RTB retractions 2X10  RTB extensions 2X10 Soft tissue massage to Lt cervical region/trap/levators  10/24/22 Seated:  cervical retraction 10X5"  Scapular retraction 10X5  Thoracic excursions with UE movements 5X each side Supine: Bridge 2X10 Trunk rotations 10X10" each Seated:  Red theraband scap retr 10X  RTB rows 10X  RTB extensions 10X  10/19/22 Supine: log roll to/from supine Decompression 2-5 10x 3" Decompression with RTB 5X each Seated:  cervical retraction 10X5"  Scapular retraction 10X5 Standing:  Red theraband scap retr 10X  RTB rows 10X  RTB extensions 10X   PATIENT EDUCATION:  Education details: 5/20: HEP;  Eval: Patient educated on exam findings, POC, scope of PT, HEP, and posture/scoliosis. Person educated: Patient Education method: Explanation, Demonstration, and Handouts Education comprehension: verbalized understanding, returned demonstration, verbal cues required, and tactile cues required  HOME EXERCISE PROGRAM: Access Code: [email or web address] URL: [email or web address]  10/31/22 - Shoulder External Rotation and Scapular Retraction with Resistance  - 1 x daily - 7 x weekly - 2 sets - 10 reps - Standing March with Counter Support  - 1 x daily - 7 x weekly - 2 sets - 10 reps - Standing Hip Abduction with Counter Support  - 1 x daily - 7 x weekly - 2 sets - 10 reps  Date: 10/19/2022 - Scapular Retraction with Resistance  - 2 x daily - 7 x weekly - 2 sets - 10 reps - Scapular Retraction with Resistance Advanced  - 2 x daily - 7 x weekly - 2 sets - 10 reps Decompression 1-4 with theraband  10/11/22:   decompression  Date: 09/29/2022 Prepared by: Emeline Gins Exercises - Correct Seated Posture  - 3 x daily - 7 x weekly - 2 sets - 10 reps - Seated Scapular Retraction with External Rotation  - 2 x daily - 7 x weekly - 1 sets - 10 reps - 5 sec hold - Sit to Stand  - 2 x daily - 7 x weekly - 1 sets - 10 reps - Beginner Bridge  - 2 x daily - 7 x weekly - 1 sets - 10 reps - Supine Transversus Abdominis Bracing - Hands on Stomach  - 2 x daily - 7 x weekly - 1 sets - 10 reps - 5 sec hold - Small Range Straight Leg Raise  - 1 x daily - 7 x weekly - 1 sets - 10 reps  Date: 09/27/2022 - Correct Seated Posture  - 3 x daily - 7 x weekly - 2 sets - 10 reps  ASSESSMENT:  CLINICAL IMPRESSION: Today's session continued to focus on core strengthening and mobility; increased Nustep level intensity without issue.  Added weight and increased reps with hip exercises with good challenge. Still needs max cues for cervical  retraction with technique.  Patient will benefit from continued skilled therapy services to address deficits and promote return to optimal function.      OBJECTIVE IMPAIRMENTS: Abnormal gait, decreased activity tolerance, decreased balance, decreased endurance, decreased mobility, difficulty walking, decreased ROM, decreased strength, hypomobility, increased muscle spasms, impaired flexibility, improper body mechanics, postural dysfunction, and pain.   ACTIVITY LIMITATIONS: carrying, lifting, bending, standing, squatting, stairs, transfers, reach over head, locomotion level, and caring for others  PARTICIPATION LIMITATIONS: meal prep, cleaning, laundry, shopping, community activity, and yard work  PERSONAL FACTORS: Age, Time since onset of injury/illness/exacerbation, and 3+ comorbidities:  hx LBP/surgery, hx breast cancer, DM, HTN, neck pain, chronic LBP, scoliosis  are also affecting patient's functional outcome.   REHAB POTENTIAL: Fair    CLINICAL DECISION MAKING:  Stable/uncomplicated  EVALUATION COMPLEXITY: Low   GOALS: Goals reviewed with patient? Yes  SHORT TERM GOALS: Target date: 10/18/2022    Patient will be independent with HEP in order to improve functional outcomes. Baseline:  Goal status: MET  2.  Patient will report at least 25% improvement in symptoms for improved quality of life. Baseline:  Goal status: MET   LONG TERM GOALS: Target date: 11/08/2022    Patient will report at least 75% improvement in symptoms for improved quality of life. Baseline:  Goal status: IN PROGRESS  2.  Patient will improve FOTO score by at least 10 points in order to indicate improved tolerance to activity. Baseline: 38% function; 53 11/08/23 Goal status: MET  3.  Patient will demonstrate at least 25% improvement in thoracolumbar and cerivcal ROM in all restricted planes for improved ability to move trunk and neck while completing chores. Baseline: see above Goal status: IN PROGRESS  4.  Patient will be able to ambulate at least 250 feet in in order to demonstrate improved tolerance to activity. Baseline: 195 feet; 297 ft 11/08/22 Goal status: MET  5.  Patient will be able to complete 5x STS in under 11.4 seconds in order to reduce the risk of falls and demo improved LE strength for household activities. Baseline: 25.22 seconds without UE support for 3 reps, UE on thighs for 2 reps. Relies LLE>RLE Goal status: IN PROGRESS     PLAN:  PT FREQUENCY: 2x/week  PT DURATION: 6 weeks  PLANNED INTERVENTIONS: Therapeutic exercises, Therapeutic activity, Neuromuscular re-education, Balance training, Gait training, Patient/Family education, Joint manipulation, Joint mobilization, Stair training, Orthotic/Fit training, DME instructions, Aquatic Therapy, Dry Needling, Electrical stimulation, Spinal manipulation, Spinal mobilization, Cryotherapy, Moist heat, Compression bandaging, scar mobilization, Splintting, Taping, Traction, Ultrasound,  Ionotophoresis 4mg /ml Dexamethasone, and Manual therapy  PLAN FOR NEXT SESSION: core, hip, postural, and functional strengthening; spinal mobility, posture.  Reassess next visit  11:15 AM, 11/15/22 Harman Ferrin Small Markese Bloxham MPT [facility] [place] [phone]

## 2022-11-17 ENCOUNTER — Ambulatory Visit (HOSPITAL_COMMUNITY): Payer: Medicare Other | Admitting: Physical Therapy

## 2022-11-17 ENCOUNTER — Encounter (HOSPITAL_COMMUNITY): Payer: Self-pay | Admitting: Physical Therapy

## 2022-11-17 DIAGNOSIS — R293 Abnormal posture: Secondary | ICD-10-CM | POA: Diagnosis not present

## 2022-11-17 DIAGNOSIS — R2689 Other abnormalities of gait and mobility: Secondary | ICD-10-CM

## 2022-11-17 DIAGNOSIS — M5459 Other low back pain: Secondary | ICD-10-CM | POA: Diagnosis not present

## 2022-11-17 DIAGNOSIS — M542 Cervicalgia: Secondary | ICD-10-CM | POA: Diagnosis not present

## 2022-11-17 DIAGNOSIS — R29898 Other symptoms and signs involving the musculoskeletal system: Secondary | ICD-10-CM

## 2022-11-17 DIAGNOSIS — M6281 Muscle weakness (generalized): Secondary | ICD-10-CM

## 2022-11-17 NOTE — Therapy (Signed)
OUTPATIENT PHYSICAL THERAPY TREATMENT  Patient Name: Christine Sutton MRN: [id number] DOB:01/27/1930, 89 y.o., female Today's Date: 11/17/2022  PHYSICAL THERAPY DISCHARGE SUMMARY  Visits from Start of Care: 13  Current functional level related to goals / functional outcomes: See below   Remaining deficits: See below   Education / Equipment: See below   Patient agrees to discharge. Patient goals were met. Patient is being discharged due to meeting the stated rehab goals.   END OF SESSION:  PT End of Session - 11/17/22 1117     Visit Number 13    Number of Visits 14    Date for PT Re-Evaluation 11/17/22    Authorization Type Medicare/BCBS    Progress Note Due on Visit 19    PT Start Time 1118    PT Stop Time 1156    PT Time Calculation (min) 38 min    Activity Tolerance Patient tolerated treatment well    Behavior During Therapy WFL for tasks assessed/performed               Past Medical History:  Diagnosis Date   Breast disorder    Diabetes mellitus (HCC)    Family history of breast cancer    Family history of colon cancer    Family history of lung cancer    High blood cholesterol level    HX: breast cancer    left   Hypertension    Invasive ductal carcinoma of left breast (HCC) 01/27/2014   ER +   Past Surgical History:  Procedure Laterality Date   BACK SURGERY     lower   CATARACT EXTRACTION, BILATERAL  11/2014   COLONOSCOPY WITH PROPOFOL N/A 01/18/2022   Procedure: COLONOSCOPY WITH PROPOFOL;  Surgeon: Dolores Frame, MD;  Location: [facility];  Service: Gastroenterology;  Laterality: N/A;   ESOPHAGOGASTRODUODENOSCOPY (EGD) WITH PROPOFOL N/A 01/18/2022   Procedure: ESOPHAGOGASTRODUODENOSCOPY (EGD) WITH PROPOFOL;  Surgeon: Dolores Frame, MD;  Location: [facility];  Service: Gastroenterology;  Laterality: N/A;   MASTECTOMY Left    SENTINEL NODE BIOPSY Right 05/15/2020   Procedure: SENTINEL NODE BIOPSY;  Surgeon: Lucretia Roers, MD;  Location: [facility];  Service: General;  Laterality: Right;   TOTAL MASTECTOMY Right 05/15/2020   Procedure: TOTAL MASTECTOMY RIGHT BREAST;  Surgeon: Lucretia Roers, MD;  Location: [facility];  Service: General;  Laterality: Right;   Patient Active Problem List   Diagnosis Date Noted   Blood loss anemia 01/16/2022   AKI (acute kidney injury) (HCC) 01/16/2022   GI bleed 01/15/2022   History of rectal bleeding 05/04/2021   Other spondylosis with radiculopathy, cervical region 01/14/2021   Foraminal stenosis of cervical region 01/14/2021   Family history of breast cancer    Family history of colon cancer    Family history of lung cancer    Genetic testing 06/28/2020   Breast cancer (HCC) 05/15/2020   Malignant neoplasm of upper-outer quadrant of right breast in female, estrogen receptor negative (HCC) 04/28/2020   History of breast cancer 10/18/2019   Pelvic pressure in female 10/18/2019   Left lower quadrant abdominal tenderness without rebound tenderness 10/18/2019   Vulvar itching 10/18/2019   Screening for colorectal cancer 10/18/2019   Invasive ductal carcinoma of left breast (HCC) 01/27/2014   Diabetes mellitus due to underlying condition without complications (HCC) 12/24/2013   Hypertension, essential, benign 12/24/2013    PCP: Carylon Perches MD  REFERRING PROVIDER: Eldred Manges, MD  REFERRING DIAG: M54.50,G89.29 (ICD-10-CM) -  Chronic bilateral low back pain, unspecified whether sciatica present  Rationale for Evaluation and Treatment: Rehabilitation  THERAPY DIAG:  Other low back pain  Muscle weakness (generalized)  Cervicalgia  Other abnormalities of gait and mobility  Other symptoms and signs involving the musculoskeletal system  Abnormal posture  ONSET DATE: chronic  SUBJECTIVE:                                                                                                                                                                                            SUBJECTIVE STATEMENT: Patient states she is doing much better. Has been able to do a little more yard work. Back is better. Still some trouble with neck but much better. Sore in L UT and sore with turning to the left. Been doing HE. Patient states 90% improvement with PT intervention.   Evaluation: Patient states neck and low back pain. She did some self traction at home with water device. Been using tens on neck and massage tool. Back doesn't bother her with sitting. Neck bothers her all the time. Had back surgery and was alright for a while. PT helped her last time she was in. She eventually stopped doing HEP provided. Can't sit very long due to symptoms. Patient states low back pain and symptoms into R leg burning to mid calf region. Tylenol and gabapentin seem to help and lidocaine. Wants to work on her neck and back issues. Hasn't been able to sleep in bed in over a year until recently getting an adjustable bed and has been sleeping in it for a week.  PERTINENT HISTORY:  Hx of interspinous device at L5-S1 later fusion at L4-5 with pedicle screws on the right side only, hx breast cancer, DM, HTN, neck pain, chronic LBP  PAIN:  Are you having pain? Yes: NPRS scale: 0/10 Pain location: low back and neck Pain description: burning Aggravating factors: sitting Relieving factors: Tylenol and gabapentin, lidocaine  PRECAUTIONS: Fall  WEIGHT BEARING RESTRICTIONS: No  FALLS:  Has patient fallen in last 6 months? No  OCCUPATION: Retired  PLOF: Independent  PATIENT GOALS: to get better  NEXT MD VISIT: next week  OBJECTIVE:   OBSERVATION: lateral lean to R in sitting most of session   DIAGNOSTIC FINDINGS:  XR 09/08/22 [facility] lateral lumbar spine images are obtained and reviewed this shows right  pedicle instrumentation with cage at L4-5.  Interspinous device L5-S1.   Scoliosis curve approximately 30 degrees right lumbar apex at L2-3  junction.  Considerable facet arthropathy noted.  No  compression  fractures.  Endplate sclerosis at L2-3 L3-4.   Impression: Previous fusion as described  above.  Scoliosis with  degenerative changes.   PATIENT SURVEYS: FOTO 38% function  11/17/22: 58% function  SCREENING FOR RED FLAGS: Bowel or bladder incontinence: No Spinal tumors: No Cauda equina syndrome: No Compression fracture: No Abdominal aneurysm: No  COGNITION: Overall cognitive status: Within functional limits for tasks assessed     SENSATION: WFL  POSTURE: rounded shoulders, forward head, and R lateral lean  and trunk flexion due to  scoliosis  PALPATION: TTP L cervical paraspinals, R lumbar paraspinals, glutes  LUMBAR ROM:   AROM eval 11/08/22  Flexion 0% limited *   Extension 75% limited 65% limited  Right lateral flexion 25% limited 20% limited  Left lateral flexion 50% limited 40% limited  Right rotation 90% limited 60% limited  Left rotation 90%limited 60% limited   (Blank rows = not tested) *= pain, rotation mostly through shoulders/thoracic spine minimal thoraco/lumbar Cervical  ROM:   AROM eval 11/08/22  Flexion 0% limited *   Extension 25% limited   Right lateral flexion 50% limited 50% limited  Left lateral flexion 50% limited 50% limited  Right rotation 25% limited   Left rotation 75% limited 50% limited   (Blank rows = not tested) *= pain, L sided neck pain in UT region  LOWER EXTREMITY ROM:   WFL for tasks assessed  Active  Right eval Left eval  Hip flexion    Hip extension    Hip abduction    Hip adduction    Hip internal rotation    Hip external rotation    Knee flexion    Knee extension    Ankle dorsiflexion    Ankle plantarflexion    Ankle inversion    Ankle eversion     (Blank rows = not tested)  LOWER EXTREMITY MMT:    MMT Right eval Left eval Right 11/08/22 Left 11/08/22  Hip flexion 4 4+ 4+ 5  Hip extension      Hip abduction      Hip adduction      Hip internal rotation      Hip external rotation      Knee  flexion 5 5    Knee extension 5 5    Ankle dorsiflexion 5 5    Ankle plantarflexion      Ankle inversion      Ankle eversion       (Blank rows = not tested)    FUNCTIONAL TESTS:  5 times sit to stand: 25.22 seconds without UE support for 3 reps, UE on thighs for 2 reps. Relies LLE>RLE 2 minute walk test: 195 feet  GAIT: Distance walked: 195 feet Assistive device utilized: None Level of assistance: Modified independence Comments: , trunk R lateral lean and flexion, decreased stride length, decreased trunk/UE rotation  TODAY'S TREATMENT:  DATE:  11/17/22 Nustep level 2 seat 9 arms 9, 5 minutes for dynamic warm up Reassessment Discussion of progress Review of HEP  11/15/22 Nustep level 2 seat 9 arms 9 dynamic warm up  Standing: Heel/toe raises x 10; on incline x 10 2# hip abduction 2 x 10 each 2# hip extension 2 x 10 each Sit to stand x 5 no UE assist  Sitting: Cervical retractions x 10 Cervical retraction with extension x 10  STM to L UT, levator scapulae x 10' to decrease pain and increase soft tissue extensibility; no other treatment performed during STM today  11/10/22 Nustep level 1 seat 9 arms 9 dynamic warm up  Standing: Heel/toe raises x 15 Hip abduction x 10 each Hip extension x 10 each GTB shoulder extension 2 x 10 GTB shoulder rows 2 x 10  Sitting: Cervical retractions x 10 STM to L UT, levator scapulae x 10' to decrease pain and increase soft tissue extensibility; no other treatment performed during STM today    11/08/22 Progress note 5 times sit to stand 27.98 without use of UE to assist 2 MWT 297 ft FOTO 53 MMT's and AROM see above  STM to L UT, levator scapulae x 10' to decrease pain and increase soft tissue extensibility; no other treatment performed during STM today   11/02/22 STM to L UT, levator scapulae Self  STM with SPC to L UT, levator scapulae - education and performance Seated cervical retractions 2 x 10  Seated scap retractions 2 x 10  Upper trap stretch 3 x 20 seconds bilateral  Standing row RTB 2 x 15 Standing shoulder extension RTB 2 x 15 Scapular retraction with GH ER RTB 2 x 10  Nustep  Level 2 seat 9, 5 minutes for conditioning  10/31/22 Seated cervical retractions 2 x 10  Seated scap retractions 2 x 10  Upper trap stretch 3 x 20 seconds bilateral  Standing row RTB 2 x 10 Standing shoulder extension RTB 2 x 10 Scapular retraction with GH ER RTB 1 x 10  Standing alternating marches 1 x 10 bilateral Standing hip abduction 1 x 10 bilateral Standing hip extension 1 x 10 bilateral Lateral stepping 5 x 10 feet bilateral Shoulder horizontal abduction RTB 1 x 10  Nustep  Level 2 seat 9, 5 minutes for conditioning  10/26/22 Seated cervical retraction 10X5"  Scapular retractions 10X5"  UE flexion following movements 10X  RTB rows 2X10  RTB retractions 2X10  RTB extensions 2X10 Soft tissue massage to Lt cervical region/trap/levators  10/24/22 Seated:  cervical retraction 10X5"  Scapular retraction 10X5  Thoracic excursions with UE movements 5X each side Supine: Bridge 2X10 Trunk rotations 10X10" each Seated:  Red theraband scap retr 10X  RTB rows 10X  RTB extensions 10X  10/19/22 Supine: log roll to/from supine Decompression 2-5 10x 3" Decompression with RTB 5X each Seated:  cervical retraction 10X5"  Scapular retraction 10X5 Standing:  Red theraband scap retr 10X  RTB rows 10X  RTB extensions 10X   PATIENT EDUCATION:  Education details:11/17/22 reassessment findings, review of HEP, returning to PT if needed;  5/20: HEP;  Eval: Patient educated on exam findings, POC, scope of PT, HEP, and posture/scoliosis. Person educated: Patient Education method: Explanation, Demonstration, and Handouts Education comprehension: verbalized understanding, returned demonstration, verbal  cues required, and tactile cues required  HOME EXERCISE PROGRAM: Access Code: [email or web address] URL: [email or web address]  10/31/22 - Shoulder External Rotation and Scapular Retraction with Resistance  - 1 x daily - 7 x  weekly - 2 sets - 10 reps - Standing March with Counter Support  - 1 x daily - 7 x weekly - 2 sets - 10 reps - Standing Hip Abduction with Counter Support  - 1 x daily - 7 x weekly - 2 sets - 10 reps  Date: 10/19/2022 - Scapular Retraction with Resistance  - 2 x daily - 7 x weekly - 2 sets - 10 reps - Scapular Retraction with Resistance Advanced  - 2 x daily - 7 x weekly - 2 sets - 10 reps Decompression 1-4 with theraband  10/11/22:  decompression  Date: 09/29/2022 Prepared by: Emeline Gins Exercises - Correct Seated Posture  - 3 x daily - 7 x weekly - 2 sets - 10 reps - Seated Scapular Retraction with External Rotation  - 2 x daily - 7 x weekly - 1 sets - 10 reps - 5 sec hold - Sit to Stand  - 2 x daily - 7 x weekly - 1 sets - 10 reps - Beginner Bridge  - 2 x daily - 7 x weekly - 1 sets - 10 reps - Supine Transversus Abdominis Bracing - Hands on Stomach  - 2 x daily - 7 x weekly - 1 sets - 10 reps - 5 sec hold - Small Range Straight Leg Raise  - 1 x daily - 7 x weekly - 1 sets - 10 reps  Date: 09/27/2022 - Correct Seated Posture  - 3 x daily - 7 x weekly - 2 sets - 10 reps  ASSESSMENT:  CLINICAL IMPRESSION: Began session with nustep for dynamic warm up and conditioning. Discussed progress made throughout the course of treatment. Patient has met 2/2 short term goals and 3/5 long term goals with ability to complete HEP and improvement in symptoms, strength, activity tolerance, gait, balance, and functional mobility. Remaining goals not met due to continued deficits in ROM and functional strength. Patient has made good progress toward remaining goals. Patient agreeable to transition to HEP. Patient discharged from PT at this time.      OBJECTIVE IMPAIRMENTS:  Abnormal gait, decreased activity tolerance, decreased balance, decreased endurance, decreased mobility, difficulty walking, decreased ROM, decreased strength, hypomobility, increased muscle spasms, impaired flexibility, improper body mechanics, postural dysfunction, and pain.   ACTIVITY LIMITATIONS: carrying, lifting, bending, standing, squatting, stairs, transfers, reach over head, locomotion level, and caring for others  PARTICIPATION LIMITATIONS: meal prep, cleaning, laundry, shopping, community activity, and yard work  PERSONAL FACTORS: Age, Time since onset of injury/illness/exacerbation, and 3+ comorbidities:  hx LBP/surgery, hx breast cancer, DM, HTN, neck pain, chronic LBP, scoliosis  are also affecting patient's functional outcome.   REHAB POTENTIAL: Fair    CLINICAL DECISION MAKING: Stable/uncomplicated  EVALUATION COMPLEXITY: Low   GOALS: Goals reviewed with patient? Yes  SHORT TERM GOALS: Target date: 10/18/2022    Patient will be independent with HEP in order to improve functional outcomes. Baseline:  Goal status: MET  2.  Patient will report at least 25% improvement in symptoms for improved quality of life. Baseline:  Goal status: MET   LONG TERM GOALS: Target date: 11/08/2022    Patient will report at least 75% improvement in symptoms for improved quality of life. Baseline:  Goal status: MET  2.  Patient will improve FOTO score by at least 10 points in order to indicate improved tolerance to activity. Baseline: 38% function; 53 11/08/23 11/17/22: 58% function Goal status: MET  3.  Patient will demonstrate at least 25% improvement in thoracolumbar and  cerivcal ROM in all restricted planes for improved ability to move trunk and neck while completing chores. Baseline: see above Goal status: IN PROGRESS  4.  Patient will be able to ambulate at least 250 feet in in order to demonstrate improved tolerance to activity. Baseline: 195 feet; 297 ft 11/08/22 Goal  status: MET  5.  Patient will be able to complete 5x STS in under 11.4 seconds in order to reduce the risk of falls and demo improved LE strength for household activities. Baseline: 25.22 seconds without UE support for 3 reps, UE on thighs for 2 reps. Relies LLE>RLE 11/17/22: 19.22 seconds without UE support (pauses between each rep) Goal status: IN PROGRESS     PLAN:  PT FREQUENCY: 2x/week  PT DURATION: 6 weeks  PLANNED INTERVENTIONS: Therapeutic exercises, Therapeutic activity, Neuromuscular re-education, Balance training, Gait training, Patient/Family education, Joint manipulation, Joint mobilization, Stair training, Orthotic/Fit training, DME instructions, Aquatic Therapy, Dry Needling, Electrical stimulation, Spinal manipulation, Spinal mobilization, Cryotherapy, Moist heat, Compression bandaging, scar mobilization, Splintting, Taping, Traction, Ultrasound, Ionotophoresis 4mg /ml Dexamethasone, and Manual therapy  PLAN FOR NEXT SESSION: n/a  11:18 AM, 11/17/22 Wyman Songster PT, DPT Physical Therapist at [facility]

## 2022-12-23 DIAGNOSIS — R531 Weakness: Secondary | ICD-10-CM | POA: Diagnosis not present

## 2023-02-21 DIAGNOSIS — I700 Atherosclerosis of aorta: Secondary | ICD-10-CM | POA: Diagnosis not present

## 2023-02-21 DIAGNOSIS — E785 Hyperlipidemia, unspecified: Secondary | ICD-10-CM | POA: Diagnosis not present

## 2023-02-21 DIAGNOSIS — Z79899 Other long term (current) drug therapy: Secondary | ICD-10-CM | POA: Diagnosis not present

## 2023-02-21 DIAGNOSIS — C50919 Malignant neoplasm of unspecified site of unspecified female breast: Secondary | ICD-10-CM | POA: Diagnosis not present

## 2023-02-21 DIAGNOSIS — M5186 Other intervertebral disc disorders, lumbar region: Secondary | ICD-10-CM | POA: Diagnosis not present

## 2023-02-21 DIAGNOSIS — D509 Iron deficiency anemia, unspecified: Secondary | ICD-10-CM | POA: Diagnosis not present

## 2023-02-21 DIAGNOSIS — E1129 Type 2 diabetes mellitus with other diabetic kidney complication: Secondary | ICD-10-CM | POA: Diagnosis not present

## 2023-02-28 DIAGNOSIS — I700 Atherosclerosis of aorta: Secondary | ICD-10-CM | POA: Diagnosis not present

## 2023-02-28 DIAGNOSIS — G7249 Other inflammatory and immune myopathies, not elsewhere classified: Secondary | ICD-10-CM | POA: Diagnosis not present

## 2023-02-28 DIAGNOSIS — E210 Primary hyperparathyroidism: Secondary | ICD-10-CM | POA: Diagnosis not present

## 2023-02-28 DIAGNOSIS — E1122 Type 2 diabetes mellitus with diabetic chronic kidney disease: Secondary | ICD-10-CM | POA: Diagnosis not present

## 2023-02-28 DIAGNOSIS — E785 Hyperlipidemia, unspecified: Secondary | ICD-10-CM | POA: Diagnosis not present

## 2023-02-28 DIAGNOSIS — R809 Proteinuria, unspecified: Secondary | ICD-10-CM | POA: Diagnosis not present

## 2023-02-28 DIAGNOSIS — Z23 Encounter for immunization: Secondary | ICD-10-CM | POA: Diagnosis not present

## 2023-02-28 DIAGNOSIS — Z853 Personal history of malignant neoplasm of breast: Secondary | ICD-10-CM | POA: Diagnosis not present

## 2023-04-05 DIAGNOSIS — Z23 Encounter for immunization: Secondary | ICD-10-CM | POA: Diagnosis not present

## 2023-04-11 DIAGNOSIS — Z87440 Personal history of urinary (tract) infections: Secondary | ICD-10-CM | POA: Diagnosis not present

## 2023-04-11 DIAGNOSIS — I10 Essential (primary) hypertension: Secondary | ICD-10-CM | POA: Diagnosis not present

## 2023-04-11 DIAGNOSIS — N3941 Urge incontinence: Secondary | ICD-10-CM | POA: Diagnosis not present

## 2023-05-16 DIAGNOSIS — I10 Essential (primary) hypertension: Secondary | ICD-10-CM | POA: Diagnosis not present

## 2023-05-16 DIAGNOSIS — N3941 Urge incontinence: Secondary | ICD-10-CM | POA: Diagnosis not present

## 2023-06-19 DIAGNOSIS — R1310 Dysphagia, unspecified: Secondary | ICD-10-CM | POA: Diagnosis not present

## 2023-06-19 DIAGNOSIS — K219 Gastro-esophageal reflux disease without esophagitis: Secondary | ICD-10-CM | POA: Diagnosis not present

## 2023-07-26 ENCOUNTER — Inpatient Hospital Stay: Payer: Medicare Other | Attending: Hematology

## 2023-07-26 DIAGNOSIS — Z853 Personal history of malignant neoplasm of breast: Secondary | ICD-10-CM | POA: Insufficient documentation

## 2023-07-26 DIAGNOSIS — E559 Vitamin D deficiency, unspecified: Secondary | ICD-10-CM | POA: Insufficient documentation

## 2023-07-26 DIAGNOSIS — C50911 Malignant neoplasm of unspecified site of right female breast: Secondary | ICD-10-CM

## 2023-07-26 LAB — COMPREHENSIVE METABOLIC PANEL
ALT: 17 U/L (ref 0–44)
AST: 19 U/L (ref 15–41)
Albumin: 4.2 g/dL (ref 3.5–5.0)
Alkaline Phosphatase: 98 U/L (ref 38–126)
Anion gap: 10 (ref 5–15)
BUN: 22 mg/dL (ref 8–23)
CO2: 25 mmol/L (ref 22–32)
Calcium: 10 mg/dL (ref 8.9–10.3)
Chloride: 100 mmol/L (ref 98–111)
Creatinine, Ser: 0.91 mg/dL (ref 0.44–1.00)
GFR, Estimated: 60 mL/min — ABNORMAL LOW (ref >60)
Glucose, Bld: 125 mg/dL — ABNORMAL HIGH (ref 70–99)
Potassium: 4.4 mmol/L (ref 3.5–5.1)
Sodium: 135 mmol/L (ref 135–145)
Total Bilirubin: 0.3 mg/dL (ref 0.0–1.2)
Total Protein: 7.1 g/dL (ref 6.5–8.1)

## 2023-07-26 LAB — CBC WITH DIFFERENTIAL/PLATELET
Abs Immature Granulocytes: 0.02 10*3/uL (ref 0.00–0.07)
Basophils Absolute: 0.1 10*3/uL (ref 0.0–0.1)
Basophils Relative: 1 %
Eosinophils Absolute: 0.3 10*3/uL (ref 0.0–0.5)
Eosinophils Relative: 3 %
HCT: 40 % (ref 36.0–46.0)
Hemoglobin: 12.6 g/dL (ref 12.0–15.0)
Immature Granulocytes: 0 %
Lymphocytes Relative: 24 %
Lymphs Abs: 2 10*3/uL (ref 0.7–4.0)
MCH: 27.2 pg (ref 26.0–34.0)
MCHC: 31.5 g/dL (ref 30.0–36.0)
MCV: 86.2 fL (ref 80.0–100.0)
Monocytes Absolute: 0.6 10*3/uL (ref 0.1–1.0)
Monocytes Relative: 7 %
Neutro Abs: 5.5 10*3/uL (ref 1.7–7.7)
Neutrophils Relative %: 65 %
Platelets: 332 10*3/uL (ref 150–400)
RBC: 4.64 MIL/uL (ref 3.87–5.11)
RDW: 13.4 % (ref 11.5–15.5)
WBC: 8.5 10*3/uL (ref 4.0–10.5)
nRBC: 0 % (ref 0.0–0.2)

## 2023-07-26 LAB — VITAMIN D 25 HYDROXY (VIT D DEFICIENCY, FRACTURES): Vit D, 25-Hydroxy: 31.15 ng/mL (ref 30–100)

## 2023-07-27 LAB — CANCER ANTIGEN 15-3: CA 15-3: 29.3 U/mL — ABNORMAL HIGH (ref 0.0–25.0)

## 2023-08-02 ENCOUNTER — Inpatient Hospital Stay: Payer: Medicare Other | Admitting: Hematology

## 2023-08-09 DIAGNOSIS — E1129 Type 2 diabetes mellitus with other diabetic kidney complication: Secondary | ICD-10-CM | POA: Diagnosis not present

## 2023-08-13 NOTE — Progress Notes (Signed)
 [facility] [address] [place] [address]    Clinic Day:  08/14/2023  Referring physician: Carylon Perches, MD  Patient Care Team: Carylon Perches, MD as PCP - General (Internal Medicine) Jena Gauss Gerrit Friends, MD as Consulting Physician (Gastroenterology)   ASSESSMENT & PLAN:   Assessment: 1.  Right breast TNBC: -Right mastectomy on 05/15/2020. -Invasive lobular carcinoma, multifocal, 1.3 cm in greatest dimension, grade 2, margins negative, 0/4 lymph nodes involved, Ki-67: 50%, ER/PR negative, HER-2 IHC 1+, FISH negative, PT 1 CPN 0 -CT CAP on 07/06/2020 did not show any evidence of metastatic disease. -Bone scan on 06/22/2020 was negative for metastatic disease. - [facility] genetic testing was negative.   2.  Stage I left breast cancer ER/PR positive and HER-2 negative: - She had mastectomy in 2012. - Mammogram reviewed by me on 03/28/2018 was BI-RADS Category 1 of the right breast. -She apparently did not take any antiestrogen therapy.   3.  Family history: -Mother had colon cancer.  Mother and sister had breast cancer. -Father had lung cancer.    Plan: 1.  T1CN0 invasive lobular carcinoma right breast, ER/PR/HER-2 negative: - No palpable adenopathy.  Bilateral mastectomy sites within normal limits. - Labs: Normal LFTs and creatinine.  CBC was normal.  CA 15-3 was 29.3 and at her baseline.  Will continue follow-up in 6 months with labs.   2.  Pancreatic cysts: - Previous MRI abdomen (01/11/2022): Stable appearance of multifocal cystic lesions within the pancreas, largest 1.8 cm. - She does not have any abdominal pains or diarrhea. - Recommend MRI abdomen pancreatic protocol in August 2025 and follow-up.    Orders Placed This Encounter  Procedures   MR Abdomen W Wo Contrast    Pancreatic protocol    Standing Status:   Future    Expiration Date:   08/13/2024    If indicated for the ordered procedure, I authorize the administration of contrast media per Radiology  protocol:   Yes    What is the patient's sedation requirement?:   No Sedation    Does the patient have a pacemaker or implanted devices?:   No    Preferred imaging location?:   [facility] (table limit - 550lbs)   VITAMIN D 25 Hydroxy (Vit-D Deficiency, Fractures)    Standing Status:   Future    Expected Date:   08/06/2024    Expiration Date:   08/13/2024   CBC with Differential    Standing Status:   Future    Expected Date:   08/06/2024    Expiration Date:   08/13/2024   Comprehensive metabolic panel    Standing Status:   Future    Expected Date:   08/06/2024    Expiration Date:   08/13/2024   Cancer antigen 15-3    Standing Status:   Future    Expected Date:   08/06/2024    Expiration Date:   08/13/2024   Cancer antigen 19-9    Standing Status:   Future    Expected Date:   01/23/2024    Expiration Date:   08/13/2024      I,Katie Daubenspeck,acting as a scribe for Doreatha Massed, MD.,have documented all relevant documentation on the behalf of Doreatha Massed, MD,as directed by  Doreatha Massed, MD while in the presence of Doreatha Massed, MD.   I, Doreatha Massed MD, have reviewed the above documentation for accuracy and completeness, and I agree with the above.   Doreatha Massed, MD  3/3/202512:57 PM  CHIEF COMPLAINT:   Diagnosis: bilateral breast cancer    Cancer Staging  No matching staging information was found for the patient.    Prior Therapy: 1. Left mastectomy in 2012 2. Right mastectomy on 05/15/20  Current Therapy:  surveillance   HISTORY OF PRESENT ILLNESS:   Oncology History  Invasive ductal carcinoma of left breast (HCC)  01/27/2014 Initial Diagnosis   Invasive ductal carcinoma of left breast (HCC)   06/26/2020 Genetic Testing   Negative genetic testing: no pathogenic variants detected in [facility] Common Hereditary Cancers Panel.  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  The report date  is June 26, 2020.   The Common Hereditary Cancers Panel offered by [facility] includes sequencing and/or deletion duplication testing of the following 48 genes: APC, ATM, AXIN2, BARD1, BMPR1A, BRCA1, BRCA2, BRIP1, CDH1, CDK4, CDKN2A (p14ARF), CDKN2A (p16INK4a), CHEK2, CTNNA1, DICER1, EPCAM (Deletion/duplication testing only), GREM1 (promoter region deletion/duplication testing only), KIT, MEN1, MLH1, MSH2, MSH3, MSH6, MUTYH, NBN, NF1, NHTL1, PALB2, PDGFRA, PMS2, POLD1, POLE, PTEN, RAD50, RAD51C, RAD51D, RNF43, SDHB, SDHC, SDHD, SMAD4, SMARCA4. STK11, TP53, TSC1, TSC2, and VHL.  The following genes were evaluated for sequence changes only: SDHA and HOXB13 c.251G>A variant only.      INTERVAL HISTORY:   Christine Sutton is a 89 y.o. female presenting to clinic today for follow up of bilateral breast cancer. She was last seen by me on 08/03/22.  Today, she states that she is doing well overall. Her appetite level is at 100%. Her energy level is at 80%.  PAST MEDICAL HISTORY:   Past Medical History: Past Medical History:  Diagnosis Date   Breast disorder    Diabetes mellitus (HCC)    Family history of breast cancer    Family history of colon cancer    Family history of lung cancer    High blood cholesterol level    HX: breast cancer    left   Hypertension    Invasive ductal carcinoma of left breast (HCC) 01/27/2014   ER +    Surgical History: Past Surgical History:  Procedure Laterality Date   BACK SURGERY     lower   CATARACT EXTRACTION, BILATERAL  11/2014   COLONOSCOPY WITH PROPOFOL N/A 01/18/2022   Procedure: COLONOSCOPY WITH PROPOFOL;  Surgeon: Dolores Frame, MD;  [facility];  Service: Gastroenterology;  Laterality: N/A;   ESOPHAGOGASTRODUODENOSCOPY (EGD) WITH PROPOFOL N/A 01/18/2022   Procedure: ESOPHAGOGASTRODUODENOSCOPY (EGD) WITH PROPOFOL;  Surgeon: Dolores Frame, MD;  Location: [facility];  Service: Gastroenterology;  Laterality: N/A;   MASTECTOMY  Left    SENTINEL NODE BIOPSY Right 05/15/2020   Procedure: SENTINEL NODE BIOPSY;  Surgeon: Lucretia Roers, MD;  Location: [facility];  Service: General;  Laterality: Right;   TOTAL MASTECTOMY Right 05/15/2020   Procedure: TOTAL MASTECTOMY RIGHT BREAST;  Surgeon: Lucretia Roers, MD;  Location: [facility];  Service: General;  Laterality: Right;    Social History: Social History   Socioeconomic History   Marital status: Widowed    Spouse name: Not on file   Number of children: Not on file   Years of education: Not on file   Highest education level: Not on file  Occupational History   Not on file  Tobacco Use   Smoking status: Never   Smokeless tobacco: Never  Vaping Use   Vaping status: Never Used  Substance and Sexual Activity   Alcohol use: No   Drug use: No  Sexual activity: Not Currently    Birth control/protection: Surgical    Comment: hyst  Other Topics Concern   Not on file  Social History Narrative   Not on file   Social Drivers of Health   Financial Resource Strain: Low Risk  (10/18/2019)   Overall Financial Resource Strain (CARDIA)    Difficulty of Paying Living Expenses: Not hard at all  Food Insecurity: No Food Insecurity (10/18/2019)   Hunger Vital Sign    Worried About Running Out of Food in the Last Year: Never true    Ran Out of Food in the Last Year: Never true  Transportation Needs: No Transportation Needs (10/18/2019)   PRAPARE - Administrator, Civil Service (Medical): No    Lack of Transportation (Non-Medical): No  Physical Activity: Insufficiently Active (10/18/2019)   Exercise Vital Sign    Days of Exercise per Week: 3 days    Minutes of Exercise per Session: 20 min  Stress: No Stress Concern Present (10/18/2019)   [employer] of Occupational Health - Occupational Stress Questionnaire    Feeling of Stress : Only a little  Social Connections: Moderately Isolated (10/18/2019)   Social Connection and Isolation Panel [NHANES]    Frequency of  Communication with Friends and Family: Once a week    Frequency of Social Gatherings with Friends and Family: Once a week    Attends Religious Services: More than 4 times per year    Active Member of [employer] or Organizations: Yes    Attends Banker Meetings: More than 4 times per year    Marital Status: Widowed  Intimate Partner Violence: Not At Risk (10/18/2019)   Humiliation, Afraid, Rape, and Kick questionnaire    Fear of Current or Ex-Partner: No    Emotionally Abused: No    Physically Abused: No    Sexually Abused: No    Family History: Family History  Problem Relation Age of Onset   Cancer Mother    Breast cancer Mother    Colon cancer Mother    Cancer Sister    Breast cancer Sister    Cancer Sister    Lung cancer Father     Current Medications:  Current Outpatient Medications:    ONETOUCH VERIO test strip, 1 each daily., Disp: , Rfl:    oxybutynin (DITROPAN) 5 MG tablet, Take 5 mg by mouth 2 (two) times daily., Disp: , Rfl:    pantoprazole (PROTONIX) 40 MG tablet, Take 40 mg by mouth every morning., Disp: , Rfl:    acetaminophen (TYLENOL) 500 MG tablet, Take 500 mg by mouth every 6 (six) hours as needed for moderate pain or headache., Disp: , Rfl:    amLODipine (NORVASC) 5 MG tablet, Take 5 mg by mouth at bedtime. , Disp: , Rfl:    aspirin 81 MG chewable tablet, Chew 81 mg by mouth at bedtime., Disp: , Rfl:    diphenhydramine-acetaminophen (TYLENOL PM) 25-500 MG TABS, Take 1 tablet by mouth at bedtime. , Disp: , Rfl:    gabapentin (NEURONTIN) 300 MG capsule, Take 300 mg by mouth 3 (three) times daily as needed (pain)., Disp: , Rfl:    metFORMIN (GLUCOPHAGE-XR) 500 MG 24 hr tablet, Take 500 mg by mouth 2 (two) times daily. , Disp: , Rfl:    Multiple Vitamins-Minerals (CENTRUM SILVER PO), Take 1 tablet by mouth daily., Disp: , Rfl:    Polyethyl Glycol-Propyl Glycol (SYSTANE OP), Place 1 drop into both eyes daily as needed (dry eyes)., Disp: ,  Rfl:    polyethylene  glycol (MIRALAX / GLYCOLAX) packet, Take 17 g by mouth at bedtime. , Disp: , Rfl:    predniSONE (STERAPRED UNI-PAK 21 TAB) 5 MG (21) TBPK tablet, Take 6,5,4,3,2,1 one tablet less each day, take with food, Disp: 21 tablet, Rfl: 0   Allergies: Allergies  Allergen Reactions   Codeine Nausea And Vomiting    REVIEW OF SYSTEMS:   Review of Systems  Constitutional:  Negative for chills, fatigue and fever.  HENT:   Negative for lump/mass, mouth sores, nosebleeds, sore throat and trouble swallowing.   Eyes:  Negative for eye problems.  Respiratory:  Negative for cough and shortness of breath.   Cardiovascular:  Negative for chest pain, leg swelling and palpitations.  Gastrointestinal:  Positive for constipation. Negative for abdominal pain, diarrhea, nausea and vomiting.  Genitourinary:  Negative for bladder incontinence, difficulty urinating, dysuria, frequency, hematuria and nocturia.   Musculoskeletal:  Positive for back pain. Negative for arthralgias, flank pain, myalgias and neck pain.  Skin:  Negative for itching and rash.  Neurological:  Negative for dizziness, headaches and numbness.  Hematological:  Does not bruise/bleed easily.  Psychiatric/Behavioral:  Negative for depression, sleep disturbance and suicidal ideas. The patient is not nervous/anxious.   All other systems reviewed and are negative.    VITALS:   Blood pressure (!) 163/71, pulse 88, temperature 98.7 F (37.1 C), temperature source Tympanic, resp. rate 18, height 5\' 5"  (1.651 m), weight 139 lb 9.6 oz (63.3 kg), SpO2 97%.  Wt Readings from Last 3 Encounters:  08/14/23 139 lb 9.6 oz (63.3 kg)  09/08/22 140 lb (63.5 kg)  08/03/22 143 lb (64.9 kg)    Body mass index is 23.23 kg/m.  Performance status (ECOG): 1 - Symptomatic but completely ambulatory  PHYSICAL EXAM:   Physical Exam Vitals and nursing note reviewed. Exam conducted with a chaperone present.  Constitutional:      Appearance: Normal appearance.   Cardiovascular:     Rate and Rhythm: Normal rate and regular rhythm.     Pulses: Normal pulses.     Heart sounds: Normal heart sounds.  Pulmonary:     Effort: Pulmonary effort is normal.     Breath sounds: Normal breath sounds.  Abdominal:     Palpations: Abdomen is soft. There is no hepatomegaly, splenomegaly or mass.     Tenderness: There is no abdominal tenderness.  Musculoskeletal:     Right lower leg: No edema.     Left lower leg: No edema.  Lymphadenopathy:     Cervical: No cervical adenopathy.     Right cervical: No superficial, deep or posterior cervical adenopathy.    Left cervical: No superficial, deep or posterior cervical adenopathy.     Upper Body:     Right upper body: No supraclavicular or axillary adenopathy.     Left upper body: No supraclavicular or axillary adenopathy.  Neurological:     General: No focal deficit present.     Mental Status: She is alert and oriented to person, place, and time.  Psychiatric:        Mood and Affect: Mood normal.        Behavior: Behavior normal.     LABS:   CBC     Component Value Date/Time   WBC 8.5 07/26/2023 1443   RBC 4.64 07/26/2023 1443   HGB 12.6 07/26/2023 1443   HGB 7.4 (L) 01/14/2022 0823   HCT 40.0 07/26/2023 1443   HCT 22.2 (L) 01/14/2022 1610  PLT 332 07/26/2023 1443   PLT 425 01/14/2022 0823   MCV 86.2 07/26/2023 1443   MCV 86 01/14/2022 0823   MCH 27.2 07/26/2023 1443   MCHC 31.5 07/26/2023 1443   RDW 13.4 07/26/2023 1443   RDW 12.2 01/14/2022 0823   LYMPHSABS 2.0 07/26/2023 1443   LYMPHSABS 1.8 01/14/2022 0823   MONOABS 0.6 07/26/2023 1443   EOSABS 0.3 07/26/2023 1443   EOSABS 0.2 01/14/2022 0823   BASOSABS 0.1 07/26/2023 1443   BASOSABS 0.1 01/14/2022 0823    CMP      Component Value Date/Time   NA 135 07/26/2023 1443   K 4.4 07/26/2023 1443   CL 100 07/26/2023 1443   CO2 25 07/26/2023 1443   GLUCOSE 125 (H) 07/26/2023 1443   BUN 22 07/26/2023 1443   CREATININE 0.91 07/26/2023  1443   CALCIUM 10.0 07/26/2023 1443   PROT 7.1 07/26/2023 1443   ALBUMIN 4.2 07/26/2023 1443   AST 19 07/26/2023 1443   ALT 17 07/26/2023 1443   ALKPHOS 98 07/26/2023 1443   BILITOT 0.3 07/26/2023 1443   GFRNONAA 60 (L) 07/26/2023 1443   GFRAA  06/11/2009 1119    >60        The eGFR has been calculated using the MDRD equation. This calculation has not been validated in all clinical situations. eGFR's persistently <60 mL/min signify possible Chronic Kidney Disease.     No results found for: "CEA1", "CEA" / No results found for: "CEA1", "CEA" No results found for: "PSA1" Lab Results  Component Value Date   ZOX096 <2 01/11/2022   No results found for: "CAN125"  No results found for: "TOTALPROTELP", "ALBUMINELP", "A1GS", "A2GS", "BETS", "BETA2SER", "GAMS", "MSPIKE", "SPEI" Lab Results  Component Value Date   TIBC 399 01/17/2022   FERRITIN 10 (L) 01/17/2022   IRONPCTSAT 5 (L) 01/17/2022   No results found for: "LDH"   STUDIES:   No results found.

## 2023-08-14 ENCOUNTER — Inpatient Hospital Stay: Payer: Medicare Other | Attending: Hematology | Admitting: Hematology

## 2023-08-14 VITALS — BP 163/71 | HR 88 | Temp 98.7°F | Resp 18 | Ht 65.0 in | Wt 139.6 lb

## 2023-08-14 DIAGNOSIS — E559 Vitamin D deficiency, unspecified: Secondary | ICD-10-CM | POA: Diagnosis not present

## 2023-08-14 DIAGNOSIS — C50911 Malignant neoplasm of unspecified site of right female breast: Secondary | ICD-10-CM | POA: Diagnosis not present

## 2023-08-14 DIAGNOSIS — Z9013 Acquired absence of bilateral breasts and nipples: Secondary | ICD-10-CM | POA: Insufficient documentation

## 2023-08-14 DIAGNOSIS — K862 Cyst of pancreas: Secondary | ICD-10-CM | POA: Insufficient documentation

## 2023-08-14 DIAGNOSIS — Z803 Family history of malignant neoplasm of breast: Secondary | ICD-10-CM | POA: Diagnosis not present

## 2023-08-14 DIAGNOSIS — Z800 Family history of malignant neoplasm of digestive organs: Secondary | ICD-10-CM | POA: Diagnosis not present

## 2023-08-14 DIAGNOSIS — Z853 Personal history of malignant neoplasm of breast: Secondary | ICD-10-CM | POA: Diagnosis not present

## 2023-08-14 DIAGNOSIS — C250 Malignant neoplasm of head of pancreas: Secondary | ICD-10-CM

## 2023-08-14 DIAGNOSIS — Z801 Family history of malignant neoplasm of trachea, bronchus and lung: Secondary | ICD-10-CM | POA: Diagnosis not present

## 2023-08-14 NOTE — Patient Instructions (Signed)
 [facility] at [facility] Discharge Instructions   You were seen and examined today by Dr. Ellin Saba.  He reviewed the results of your lab work which are normal/stable.   We will see you back in one year. We will repeat lab work prior to this visit.    Return as scheduled.    Thank you for choosing [facility] at [facility] to provide your oncology and hematology care.  To afford each patient quality time with our provider, please arrive at least 15 minutes before your scheduled appointment time.   If you have a lab appointment with the Cancer Center please come in thru the Main Entrance and check in at the main information desk.  You need to re-schedule your appointment should you arrive 10 or more minutes late.  We strive to give you quality time with our providers, and arriving late affects you and other patients whose appointments are after yours.  Also, if you no show three or more times for appointments you may be dismissed from the clinic at the providers discretion.     Again, thank you for choosing [facility].  Our hope is that these requests will decrease the amount of time that you wait before being seen by our physicians.       _____________________________________________________________  Should you have questions after your visit to [facility], please contact our office at [phone] and follow the prompts.  Our office hours are 8:00 a.m. and 4:30 p.m. Monday - Friday.  Please note that voicemails left after 4:00 p.m. may not be returned until the following business day.  We are closed weekends and major holidays.  You do have access to a nurse 24-7, just call the main number to the clinic [phone] and do not press any options, hold on the line and a nurse will answer the phone.    For prescription refill requests, have your pharmacy contact our office and allow 72 hours.    Due to Covid, you  will need to wear a mask upon entering the hospital. If you do not have a mask, a mask will be given to you at the Main Entrance upon arrival. For doctor visits, patients may have 1 support person age 57 or older with them. For treatment visits, patients can not have anyone with them due to social distancing guidelines and our immunocompromised population.

## 2023-08-16 DIAGNOSIS — E1129 Type 2 diabetes mellitus with other diabetic kidney complication: Secondary | ICD-10-CM | POA: Diagnosis not present

## 2023-08-16 DIAGNOSIS — I10 Essential (primary) hypertension: Secondary | ICD-10-CM | POA: Diagnosis not present

## 2023-11-09 DIAGNOSIS — E1129 Type 2 diabetes mellitus with other diabetic kidney complication: Secondary | ICD-10-CM | POA: Diagnosis not present

## 2023-11-16 DIAGNOSIS — N3941 Urge incontinence: Secondary | ICD-10-CM | POA: Diagnosis not present

## 2023-11-16 DIAGNOSIS — I10 Essential (primary) hypertension: Secondary | ICD-10-CM | POA: Diagnosis not present

## 2023-11-16 DIAGNOSIS — E1129 Type 2 diabetes mellitus with other diabetic kidney complication: Secondary | ICD-10-CM | POA: Diagnosis not present

## 2023-12-28 DIAGNOSIS — I10 Essential (primary) hypertension: Secondary | ICD-10-CM | POA: Diagnosis not present

## 2023-12-28 DIAGNOSIS — N3941 Urge incontinence: Secondary | ICD-10-CM | POA: Diagnosis not present

## 2024-01-24 ENCOUNTER — Ambulatory Visit (HOSPITAL_COMMUNITY)
Admission: RE | Admit: 2024-01-24 | Discharge: 2024-01-24 | Disposition: A | Discharge status: Home / Self Care | Source: Ambulatory Visit | Attending: Hematology | Admitting: Hematology

## 2024-01-24 ENCOUNTER — Inpatient Hospital Stay: Attending: Hematology

## 2024-01-24 ENCOUNTER — Other Ambulatory Visit: Payer: Self-pay | Admitting: Hematology

## 2024-01-24 DIAGNOSIS — Z800 Family history of malignant neoplasm of digestive organs: Secondary | ICD-10-CM | POA: Diagnosis not present

## 2024-01-24 DIAGNOSIS — C250 Malignant neoplasm of head of pancreas: Secondary | ICD-10-CM

## 2024-01-24 DIAGNOSIS — R978 Other abnormal tumor markers: Secondary | ICD-10-CM | POA: Insufficient documentation

## 2024-01-24 DIAGNOSIS — E559 Vitamin D deficiency, unspecified: Secondary | ICD-10-CM | POA: Insufficient documentation

## 2024-01-24 DIAGNOSIS — Z803 Family history of malignant neoplasm of breast: Secondary | ICD-10-CM | POA: Diagnosis not present

## 2024-01-24 DIAGNOSIS — Z853 Personal history of malignant neoplasm of breast: Secondary | ICD-10-CM | POA: Insufficient documentation

## 2024-01-24 DIAGNOSIS — K862 Cyst of pancreas: Secondary | ICD-10-CM | POA: Insufficient documentation

## 2024-01-24 DIAGNOSIS — Z9013 Acquired absence of bilateral breasts and nipples: Secondary | ICD-10-CM | POA: Insufficient documentation

## 2024-01-24 DIAGNOSIS — K8689 Other specified diseases of pancreas: Secondary | ICD-10-CM | POA: Diagnosis not present

## 2024-01-24 DIAGNOSIS — Z801 Family history of malignant neoplasm of trachea, bronchus and lung: Secondary | ICD-10-CM | POA: Insufficient documentation

## 2024-01-24 DIAGNOSIS — Z08 Encounter for follow-up examination after completed treatment for malignant neoplasm: Secondary | ICD-10-CM | POA: Diagnosis not present

## 2024-01-24 DIAGNOSIS — K7689 Other specified diseases of liver: Secondary | ICD-10-CM | POA: Diagnosis not present

## 2024-01-24 MED ORDER — GADOBUTROL 1 MMOL/ML IV SOLN
6.0000 mL | Freq: Once | INTRAVENOUS | Status: AC | PRN
Start: 2024-01-24 — End: 2024-01-24
  Administered 2024-01-24 (×2): 6 mL via INTRAVENOUS

## 2024-01-25 LAB — CANCER ANTIGEN 19-9: CA 19-9: <2 U/mL (ref 0–35)

## 2024-01-30 ENCOUNTER — Telehealth: Payer: Self-pay | Admitting: *Deleted

## 2024-01-30 ENCOUNTER — Ambulatory Visit: Payer: Self-pay | Admitting: Internal Medicine

## 2024-01-30 NOTE — Telephone Encounter (Signed)
 Attempted to contact to schedule lab appointment. No answer.

## 2024-01-31 ENCOUNTER — Inpatient Hospital Stay (HOSPITAL_BASED_OUTPATIENT_CLINIC_OR_DEPARTMENT_OTHER): Admitting: Oncology

## 2024-01-31 VITALS — Wt 133.6 lb

## 2024-01-31 DIAGNOSIS — Z853 Personal history of malignant neoplasm of breast: Secondary | ICD-10-CM | POA: Diagnosis not present

## 2024-01-31 DIAGNOSIS — K859 Acute pancreatitis without necrosis or infection, unspecified: Secondary | ICD-10-CM | POA: Insufficient documentation

## 2024-01-31 DIAGNOSIS — E559 Vitamin D deficiency, unspecified: Secondary | ICD-10-CM | POA: Diagnosis not present

## 2024-01-31 DIAGNOSIS — C50912 Malignant neoplasm of unspecified site of left female breast: Secondary | ICD-10-CM

## 2024-01-31 DIAGNOSIS — K862 Cyst of pancreas: Secondary | ICD-10-CM | POA: Diagnosis not present

## 2024-01-31 DIAGNOSIS — Z800 Family history of malignant neoplasm of digestive organs: Secondary | ICD-10-CM | POA: Diagnosis not present

## 2024-01-31 DIAGNOSIS — Z08 Encounter for follow-up examination after completed treatment for malignant neoplasm: Secondary | ICD-10-CM | POA: Diagnosis not present

## 2024-01-31 DIAGNOSIS — Z9013 Acquired absence of bilateral breasts and nipples: Secondary | ICD-10-CM | POA: Diagnosis not present

## 2024-01-31 NOTE — Assessment & Plan Note (Addendum)
-   No palpable adenopathy.  Bilateral mastectomy sites within normal limits. - Labs: 08/14/23-Normal LFTs and creatinine.  CBC was normal.  CA 15-3 was 29.3 and at her baseline.   -Patient may follow-up with Dr. Sheryle in the future.  She does not require mammograms given she has had a bilateral mastectomy.  Patient agreeable.

## 2024-01-31 NOTE — Assessment & Plan Note (Addendum)
-   Previous MRI abdomen (01/11/22): Stable appearance of multifocal cystic lesions within the pancreas, largest 1.8 cm. - She does not have any abdominal pains or diarrhea. - Most recent imaging from 01/11/2022 MRI abdomen showed unchanged fluid signal cysts scattered throughout the pancreas, largest in the superior pancreatic body measuring 2.0 x 1.3 cm.  Pancreatic duct is mildly prominent measuring up to 0.3 cm.  No solid component or suspicious contrast-enhancement.  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  Given very advanced age and established imaging stability, no further workup is recommended. -She will follow-up with Dr. Shaaron as needed.

## 2024-01-31 NOTE — Progress Notes (Signed)
 [facility] OFFICE PROGRESS NOTE  Sheryle Carwin, MD  ASSESSMENT & PLAN:  Assessment & Plan Invasive ductal carcinoma of left breast (HCC) - No palpable adenopathy.  Bilateral mastectomy sites within normal limits. - Labs: 08/14/23-Normal LFTs and creatinine.  CBC was normal.  CA 15-3 was 29.3 and at her baseline.   -Patient may follow-up with Dr. Sheryle in the future.  She does not require mammograms given she has had a bilateral mastectomy.  Patient agreeable.  Pancreatic abscess - Previous MRI abdomen (01/11/22): Stable appearance of multifocal cystic lesions within the pancreas, largest 1.8 cm. - She does not have any abdominal pains or diarrhea. - Most recent imaging from 01/11/2022 MRI abdomen showed unchanged fluid signal cysts scattered throughout the pancreas, largest in the superior pancreatic body measuring 2.0 x 1.3 cm.  Pancreatic duct is mildly prominent measuring up to 0.3 cm.  No solid component or suspicious contrast-enhancement.  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  Given very advanced age and established imaging stability, no further workup is recommended. -She will follow-up with Dr. Shaaron as needed.    No orders of the defined types were placed in this encounter.   INTERVAL HISTORY: Patient returns for lab for breast cancer, pancreatic cyst and vitamin D  deficiency.  Since her last visit she was seen by orthopedics for chronic low back pain with sciatica.  She was given a Medrol Dosepak and prescribed physical therapy.  Reports doing well since her last visit.  She is here today with her son.  Her appetite is 100% energy levels are 50%.  She is having pain in her back that is radiating down her leg mainly on the right side.  She has been taking Tylenol  arthritis and gabapentin  without complete relief of her symptoms.  She is taking an over-the-counter calcium and vitamin D  supplement.  We reviewed her CT abdomen and MRI that were both completed on  01/24/2024.  SUMMARY OF HEMATOLOGIC HISTORY: 1.  Right breast TNBC: -Right mastectomy on 05/15/2020. -Invasive lobular carcinoma, multifocal, 1.3 cm in greatest dimension, grade 2, margins negative, 0/4 lymph nodes involved, Ki-67: 50%, ER/PR negative, HER-2 IHC 1+, FISH negative, PT 1 CPN 0 -CT CAP on 07/06/2020 did not show any evidence of metastatic disease. -Bone scan on 06/22/2020 was negative for metastatic disease. - [facility] genetic testing was negative.   2.  Stage I left breast cancer ER/PR positive and HER-2 negative: - She had mastectomy in 2012. - Mammogram reviewed by me on 03/28/2018 was BI-RADS Category 1 of the right breast. -She apparently did not take any antiestrogen therapy.   3.  Family history: -Mother had colon cancer.  Mother and sister had breast cancer. -Father had lung cancer.  No results found for: CBC  There were no vitals filed for this visit.  Review of Systems  Constitutional:  Negative for malaise/fatigue and weight loss.  Cardiovascular:  Positive for palpitations.  Gastrointestinal:  Positive for constipation.  Musculoskeletal:  Positive for back pain.  Neurological:  Positive for tingling and sensory change.   Physical Exam Constitutional:      Appearance: Normal appearance.  Cardiovascular:     Rate and Rhythm: Normal rate and regular rhythm.  Pulmonary:     Effort: Pulmonary effort is normal.     Breath sounds: Normal breath sounds.  Abdominal:     General: Bowel sounds are normal.     Palpations: Abdomen is soft.  Musculoskeletal:        General:  No swelling. Normal range of motion.  Neurological:     Mental Status: She is alert and oriented to person, place, and time. Mental status is at baseline.      I spent 25 minutes dedicated to the care of this patient (face-to-face and non-face-to-face) on the date of the encounter to include what is described in the assessment and plan.,  Delon Hope, NP 01/31/2024 12:02 PM

## 2024-02-27 DIAGNOSIS — E118 Type 2 diabetes mellitus with unspecified complications: Secondary | ICD-10-CM | POA: Diagnosis not present

## 2024-02-27 DIAGNOSIS — E785 Hyperlipidemia, unspecified: Secondary | ICD-10-CM | POA: Diagnosis not present

## 2024-02-27 DIAGNOSIS — E1129 Type 2 diabetes mellitus with other diabetic kidney complication: Secondary | ICD-10-CM | POA: Diagnosis not present

## 2024-02-27 DIAGNOSIS — Z79899 Other long term (current) drug therapy: Secondary | ICD-10-CM | POA: Diagnosis not present

## 2024-02-27 DIAGNOSIS — I700 Atherosclerosis of aorta: Secondary | ICD-10-CM | POA: Diagnosis not present

## 2024-02-27 DIAGNOSIS — D509 Iron deficiency anemia, unspecified: Secondary | ICD-10-CM | POA: Diagnosis not present

## 2024-03-05 DIAGNOSIS — Z23 Encounter for immunization: Secondary | ICD-10-CM | POA: Diagnosis not present

## 2024-03-05 DIAGNOSIS — I10 Essential (primary) hypertension: Secondary | ICD-10-CM | POA: Diagnosis not present

## 2024-03-05 DIAGNOSIS — M791 Myalgia, unspecified site: Secondary | ICD-10-CM | POA: Diagnosis not present

## 2024-03-05 DIAGNOSIS — R800 Isolated proteinuria: Secondary | ICD-10-CM | POA: Diagnosis not present

## 2024-03-05 DIAGNOSIS — E1129 Type 2 diabetes mellitus with other diabetic kidney complication: Secondary | ICD-10-CM | POA: Diagnosis not present

## 2024-03-05 DIAGNOSIS — Z853 Personal history of malignant neoplasm of breast: Secondary | ICD-10-CM | POA: Diagnosis not present

## 2024-03-06 ENCOUNTER — Encounter (HOSPITAL_COMMUNITY): Payer: Self-pay

## 2024-03-06 ENCOUNTER — Other Ambulatory Visit: Payer: Self-pay

## 2024-03-06 ENCOUNTER — Observation Stay (HOSPITAL_COMMUNITY)
Admission: EM | Admit: 2024-03-06 | Discharge: 2024-03-07 | Disposition: A | Discharge status: Home / Self Care | Attending: Internal Medicine | Admitting: Internal Medicine

## 2024-03-06 DIAGNOSIS — Z7982 Long term (current) use of aspirin: Secondary | ICD-10-CM | POA: Diagnosis not present

## 2024-03-06 DIAGNOSIS — K625 Hemorrhage of anus and rectum: Secondary | ICD-10-CM

## 2024-03-06 DIAGNOSIS — Z79899 Other long term (current) drug therapy: Secondary | ICD-10-CM | POA: Insufficient documentation

## 2024-03-06 DIAGNOSIS — D0590 Unspecified type of carcinoma in situ of unspecified breast: Secondary | ICD-10-CM | POA: Insufficient documentation

## 2024-03-06 DIAGNOSIS — E871 Hypo-osmolality and hyponatremia: Secondary | ICD-10-CM | POA: Insufficient documentation

## 2024-03-06 DIAGNOSIS — C50919 Malignant neoplasm of unspecified site of unspecified female breast: Secondary | ICD-10-CM | POA: Diagnosis present

## 2024-03-06 DIAGNOSIS — K922 Gastrointestinal hemorrhage, unspecified: Principal | ICD-10-CM | POA: Insufficient documentation

## 2024-03-06 DIAGNOSIS — R531 Weakness: Secondary | ICD-10-CM | POA: Diagnosis not present

## 2024-03-06 DIAGNOSIS — I10 Essential (primary) hypertension: Secondary | ICD-10-CM | POA: Diagnosis not present

## 2024-03-06 DIAGNOSIS — E119 Type 2 diabetes mellitus without complications: Secondary | ICD-10-CM | POA: Insufficient documentation

## 2024-03-06 DIAGNOSIS — D649 Anemia, unspecified: Secondary | ICD-10-CM | POA: Insufficient documentation

## 2024-03-06 DIAGNOSIS — E089 Diabetes mellitus due to underlying condition without complications: Secondary | ICD-10-CM | POA: Diagnosis present

## 2024-03-06 DIAGNOSIS — D500 Iron deficiency anemia secondary to blood loss (chronic): Secondary | ICD-10-CM | POA: Diagnosis present

## 2024-03-06 LAB — COMPREHENSIVE METABOLIC PANEL WITH GFR
ALT: 15 U/L (ref 0–44)
AST: 16 U/L (ref 15–41)
Albumin: 3.8 g/dL (ref 3.5–5.0)
Alkaline Phosphatase: 73 U/L (ref 38–126)
Anion gap: 12 (ref 5–15)
BUN: 24 mg/dL — ABNORMAL HIGH (ref 8–23)
CO2: 21 mmol/L — ABNORMAL LOW (ref 22–32)
Calcium: 9.7 mg/dL (ref 8.9–10.3)
Chloride: 99 mmol/L (ref 98–111)
Creatinine, Ser: 0.88 mg/dL (ref 0.44–1.00)
GFR, Estimated: >60 mL/min (ref >60)
Glucose, Bld: 140 mg/dL — ABNORMAL HIGH (ref 70–99)
Potassium: 4.5 mmol/L (ref 3.5–5.1)
Sodium: 132 mmol/L — ABNORMAL LOW (ref 135–145)
Total Bilirubin: 0.6 mg/dL (ref 0.0–1.2)
Total Protein: 6.4 g/dL — ABNORMAL LOW (ref 6.5–8.1)

## 2024-03-06 LAB — TYPE AND SCREEN
ABO/RH(D): B POS
Antibody Screen: POSITIVE

## 2024-03-06 LAB — CBC WITH DIFFERENTIAL/PLATELET
Abs Immature Granulocytes: 0.02 K/uL (ref 0.00–0.07)
Basophils Absolute: 0.1 K/uL (ref 0.0–0.1)
Basophils Relative: 1 %
Eosinophils Absolute: 0.1 K/uL (ref 0.0–0.5)
Eosinophils Relative: 1 %
HCT: 29.7 % — ABNORMAL LOW (ref 36.0–46.0)
Hemoglobin: 8.9 g/dL — ABNORMAL LOW (ref 12.0–15.0)
Immature Granulocytes: 0 %
Lymphocytes Relative: 13 %
Lymphs Abs: 1 K/uL (ref 0.7–4.0)
MCH: 24.5 pg — ABNORMAL LOW (ref 26.0–34.0)
MCHC: 30 g/dL (ref 30.0–36.0)
MCV: 81.6 fL (ref 80.0–100.0)
Monocytes Absolute: 0.5 K/uL (ref 0.1–1.0)
Monocytes Relative: 6 %
Neutro Abs: 6 K/uL (ref 1.7–7.7)
Neutrophils Relative %: 79 %
Platelets: 368 K/uL (ref 150–400)
RBC: 3.64 MIL/uL — ABNORMAL LOW (ref 3.87–5.11)
RDW: 19.9 % — ABNORMAL HIGH (ref 11.5–15.5)
WBC: 7.6 K/uL (ref 4.0–10.5)
nRBC: 0 % (ref 0.0–0.2)

## 2024-03-06 LAB — POC OCCULT BLOOD, ED: Fecal Occult Bld: NEGATIVE

## 2024-03-06 LAB — HEMOGLOBIN AND HEMATOCRIT, BLOOD
HCT: 28.3 % — ABNORMAL LOW (ref 36.0–46.0)
HCT: 25.3 % — ABNORMAL LOW (ref 36.0–46.0)
Hemoglobin: 8 g/dL — ABNORMAL LOW (ref 12.0–15.0)
Hemoglobin: 7.8 g/dL — ABNORMAL LOW (ref 12.0–15.0)

## 2024-03-06 LAB — LIPASE, BLOOD: Lipase: 37 U/L (ref 11–51)

## 2024-03-06 MED ORDER — SODIUM CHLORIDE 0.9 % IV BOLUS
500.0000 mL | Freq: Once | INTRAVENOUS | Status: AC
  Administered 2024-03-06: 500 mL via INTRAVENOUS

## 2024-03-06 MED ORDER — ONDANSETRON HCL 4 MG/2ML IJ SOLN: 4.0000 mg | Freq: Four times a day (QID) | INTRAMUSCULAR | Status: DC | PRN

## 2024-03-06 MED ORDER — MIRABEGRON ER 25 MG PO TB24
25.0000 mg | ORAL_TABLET | Freq: Every day | ORAL | Status: DC
  Administered 2024-03-06 – 2024-03-07 (×2): 25 mg via ORAL
  Filled 2024-03-06 (×2): qty 1

## 2024-03-06 MED ORDER — ACETAMINOPHEN 650 MG RE SUPP: 650.0000 mg | Freq: Four times a day (QID) | RECTAL | Status: DC | PRN

## 2024-03-06 MED ORDER — OXYBUTYNIN CHLORIDE 5 MG PO TABS: 5.0000 mg | ORAL_TABLET | Freq: Two times a day (BID) | ORAL | Status: DC

## 2024-03-06 MED ORDER — SODIUM CHLORIDE 0.9 % IV SOLN: INTRAVENOUS | Status: AC

## 2024-03-06 MED ORDER — ONDANSETRON HCL 4 MG PO TABS: 4.0000 mg | ORAL_TABLET | Freq: Four times a day (QID) | ORAL | Status: DC | PRN

## 2024-03-06 MED ORDER — PANTOPRAZOLE SODIUM 40 MG IV SOLR
40.0000 mg | Freq: Once | INTRAVENOUS | Status: AC
  Administered 2024-03-06: 40 mg via INTRAVENOUS
  Filled 2024-03-06: qty 10

## 2024-03-06 MED ORDER — PANTOPRAZOLE SODIUM 40 MG PO TBEC
40.0000 mg | DELAYED_RELEASE_TABLET | Freq: Every day | ORAL | Status: DC
  Administered 2024-03-07: 40 mg via ORAL
  Filled 2024-03-06: qty 1

## 2024-03-06 MED ORDER — GABAPENTIN 300 MG PO CAPS: 300.0000 mg | ORAL_CAPSULE | Freq: Three times a day (TID) | ORAL | Status: DC | PRN

## 2024-03-06 MED ORDER — ACETAMINOPHEN 325 MG PO TABS: 650.0000 mg | ORAL_TABLET | Freq: Four times a day (QID) | ORAL | Status: DC | PRN

## 2024-03-06 NOTE — H&P (Signed)
 History and Physical    Christine Sutton FMW:[id number] DOB: May 24, 1930 DOA: 03/06/2024  PCP: Sheryle Carwin, MD   Patient coming from: Home  Chief Complaint: Rectal bleed  HPI: Christine Sutton is a 89 y.o. female with medical history significant for hypertension, diabetes, breast cancer, iron  deficiency anemia, and diverticulosis who presented to the ED with concerns for painless rectal bleeding and some constipation.  She had a similar episode in 2023 and underwent upper and lower endoscopy with noted diverticulosis at that time.  She noted some maroon-colored stools around 1 AM this morning and then had further episodes of frank hematochezia.  She denies any abdominal pain, nausea, vomiting, or diarrhea but has been having some constipation and requiring some MiraLAX  and states that her stools have been pretty hard.   ED Course: Vital signs are stable and patient is afebrile.  Hemoglobin noted to be 8.9 with baseline near 12 and sodium 132.  Patient given 500 mL fluid bolus as well as IV Protonix  and GI consulted for further evaluation with current recommendations to remain on clear liquid diet and monitor repeat hemoglobin levels.  Review of Systems: Reviewed as noted above, otherwise negative.  Past Medical History:  Diagnosis Date   Breast disorder    Diabetes mellitus (HCC)    Family history of breast cancer    Family history of colon cancer    Family history of lung cancer    High blood cholesterol level    HX: breast cancer    left   Hypertension    Invasive ductal carcinoma of left breast (HCC) 01/27/2014   ER +    Past Surgical History:  Procedure Laterality Date   BACK SURGERY     lower   CATARACT EXTRACTION, BILATERAL  11/2014   COLONOSCOPY WITH PROPOFOL  N/A 01/18/2022   Procedure: COLONOSCOPY WITH PROPOFOL ;  Surgeon: Eartha Angelia Sieving, MD;  [facility];  Service: Gastroenterology;  Laterality: N/A;   ESOPHAGOGASTRODUODENOSCOPY (EGD) WITH PROPOFOL  N/A  01/18/2022   Procedure: ESOPHAGOGASTRODUODENOSCOPY (EGD) WITH PROPOFOL ;  Surgeon: Eartha Angelia Sieving, MD;  Location: [facility];  Service: Gastroenterology;  Laterality: N/A;   MASTECTOMY Left    SENTINEL NODE BIOPSY Right 05/15/2020   Procedure: SENTINEL NODE BIOPSY;  Surgeon: Kallie Manuelita BROCKS, MD;  Location: [facility];  Service: General;  Laterality: Right;   TOTAL MASTECTOMY Right 05/15/2020   Procedure: TOTAL MASTECTOMY RIGHT BREAST;  Surgeon: Kallie Manuelita BROCKS, MD;  Location: [facility];  Service: General;  Laterality: Right;     reports that she has never smoked. She has never used smokeless tobacco. She reports that she does not drink alcohol  and does not use drugs.  Allergies  Allergen Reactions   Codeine Nausea And Vomiting   Ivp Dye [Iodinated Contrast Media]     Family History  Problem Relation Age of Onset   Cancer Mother    Breast cancer Mother    Colon cancer Mother    Cancer Sister    Breast cancer Sister    Cancer Sister    Lung cancer Father     Prior to Admission medications   Medication Sig Start Date End Date Taking? Authorizing Provider  acetaminophen  (TYLENOL ) 500 MG tablet Take 500 mg by mouth every 6 (six) hours as needed for moderate pain or headache.   Yes [provider]  amLODipine  (NORVASC ) 5 MG tablet Take 5 mg by mouth every evening.   Yes [provider]  aspirin 81 MG chewable tablet  Chew 81 mg by mouth at bedtime.   Yes [provider]  diphenhydramine -acetaminophen  (TYLENOL  PM) 25-500 MG TABS Take 1 tablet by mouth at bedtime.    Yes [provider]  docusate sodium  (COLACE) 100 MG capsule Take 100 mg by mouth daily.   Yes [provider]  gabapentin  (NEURONTIN ) 300 MG capsule Take 300 mg by mouth 3 (three) times daily as needed (pain). 09/30/19  Yes [provider]  metFORMIN (GLUCOPHAGE-XR) 500 MG 24 hr tablet Take 500 mg by mouth 2 (two) times daily.  12/12/16  Yes [provider]   mirabegron  ER (MYRBETRIQ ) 25 MG TB24 tablet Take 25 mg by mouth daily. 12/28/23  Yes [provider]  Multiple Vitamins-Minerals (CENTRUM SILVER PO) Take 1 tablet by mouth daily.   Yes [provider]  pantoprazole  (PROTONIX ) 40 MG tablet Take 40 mg by mouth every morning. 06/19/23  Yes [provider]  Polyethyl Glycol-Propyl Glycol (SYSTANE OP) Place 1 drop into both eyes daily as needed (dry eyes).   Yes [provider]  polyethylene glycol (MIRALAX  / GLYCOLAX ) packet Take 17 g by mouth at bedtime.    Yes [provider]  lisinopril  (ZESTRIL ) 10 MG tablet Take 10 mg by mouth daily. 03/05/24   [provider]  [facility] VERIO test strip 1 each daily. 07/23/23   [provider]  oxybutynin  (DITROPAN ) 5 MG tablet Take 5 mg by mouth 2 (two) times daily. Patient not taking: Reported on 03/06/2024 07/04/23   [provider]    Physical Exam: Vitals:   03/06/24 1315 03/06/24 1345 03/06/24 1400 03/06/24 1402  BP: (!) 155/66 (!) 156/72 (!) 161/66   Pulse: 78 80 83 84  Resp: 14 15 16 16   Temp:      TempSrc:      SpO2: 96% 97%  100%  Weight:      Height:        Constitutional: NAD, calm, comfortable Vitals:   03/06/24 1315 03/06/24 1345 03/06/24 1400 03/06/24 1402  BP: (!) 155/66 (!) 156/72 (!) 161/66   Pulse: 78 80 83 84  Resp: 14 15 16 16   Temp:      TempSrc:      SpO2: 96% 97%  100%  Weight:      Height:       Eyes: lids and conjunctivae normal Neck: normal, supple Respiratory: clear to auscultation bilaterally. Normal respiratory effort. No accessory muscle use.  Cardiovascular: Regular rate and rhythm, no murmurs. Abdomen: no tenderness, no distention. Bowel sounds positive.  Musculoskeletal:  No edema. Skin: no rashes, lesions, ulcers.  Psychiatric: Flat affect  Labs on Admission: I have personally reviewed following labs and imaging studies  CBC: Recent Labs  Lab 03/06/24 1122  WBC 7.6  NEUTROABS 6.0   HGB 8.9*  HCT 29.7*  MCV 81.6  PLT 368   Basic Metabolic Panel: Recent Labs  Lab 03/06/24 1122  NA 132*  K 4.5  CL 99  CO2 21*  GLUCOSE 140*  BUN 24*  CREATININE 0.88  CALCIUM 9.7   GFR: Estimated Creatinine Clearance: 36.7 mL/min (by C-G formula based on SCr of 0.88 mg/dL). Liver Function Tests: Recent Labs  Lab 03/06/24 1122  AST 16  ALT 15  ALKPHOS 73  BILITOT 0.6  PROT 6.4*  ALBUMIN 3.8   Recent Labs  Lab 03/06/24 1122  LIPASE 37   No results for input(s): AMMONIA in the last 168 hours. Coagulation Profile: No results for input(s): INR, PROTIME  in the last 168 hours. Cardiac Enzymes: No results for input(s): CKTOTAL, CKMB, CKMBINDEX, TROPONINI in the last 168 hours. BNP (last 3 results) No results for input(s): PROBNP in the last 8760 hours. HbA1C: No results for input(s): HGBA1C in the last 72 hours. CBG: No results for input(s): GLUCAP in the last 168 hours. Lipid Profile: No results for input(s): CHOL, HDL, LDLCALC, TRIG, CHOLHDL, LDLDIRECT in the last 72 hours. Thyroid  Function Tests: No results for input(s): TSH, T4TOTAL, FREET4, T3FREE, THYROIDAB in the last 72 hours. Anemia Panel: No results for input(s): VITAMINB12, FOLATE, FERRITIN, TIBC, IRON , RETICCTPCT in the last 72 hours. Urine analysis: No results found for: COLORURINE, APPEARANCEUR, LABSPEC, PHURINE, GLUCOSEU, HGBUR, BILIRUBINUR, KETONESUR, PROTEINUR, UROBILINOGEN, NITRITE, LEUKOCYTESUR  Radiological Exams on Admission: No results found.  EKG: Independently reviewed.  SR, 78 bpm.  Assessment/Plan Principal Problem:   Acute GI bleeding Active Problems:   Diabetes mellitus due to underlying condition without complications (HCC)   Hypertension, essential, benign   Breast cancer (HCC)   Blood loss anemia    Acute blood loss anemia secondary to suspected lower GI bleed - Patient noted to have history  of diverticulosis noted on colonoscopy in 2023 -Hemoglobin noted to be 12.6, 7 months ago and patient noted to have history of iron  deficiency anemia - Continue clear liquid diet - Hemoglobin and hematocrit every 6 hours with plans to transfuse for hemoglobin less than 7 - Appreciate GI evaluation with plans to observe for now - Hold aspirin  Mild hyponatremia - Start on some normal saline and monitor  History of breast cancer - Follows with Dr. Rogers  Hypertension - Hold home antihypertensives for now in the setting of potential GI bleed  Type 2 diabetes - Hold home metformin - SSI   DVT prophylaxis: SCDs Code Status: DNR Family Communication: Son at bedside Disposition Plan: Admit for evaluation of GI bleed Consults called: GI Admission status: Observation, telemetry  Severity of Illness: The appropriate patient status for this patient is OBSERVATION. Observation status is judged to be reasonable and necessary in order to provide the required intensity of service to ensure the patient's safety. The patient's presenting symptoms, physical exam findings, and initial radiographic and laboratory data in the context of their medical condition is felt to place them at decreased risk for further clinical deterioration. Furthermore, it is anticipated that the patient will be medically stable for discharge from the hospital within 2 midnights of admission.    Christine Rossa D Maree DO [facility]  If 7PM-7AM, please contact night-coverage [email or web address]  03/06/2024, 2:08 PM

## 2024-03-06 NOTE — Consult Note (Signed)
 Gastroenterology Consult   Referring Provider: No ref. provider found Primary Care Physician:  Sheryle Carwin, MD Primary Gastroenterologist:  Dr. Cindie   Patient ID: Christine Sutton; [id number]; 1929-11-03   Admit date: 03/06/2024  LOS: 0 days   Date of Consultation: 03/06/2024  Reason for Consultation:  rectal bleeding/anemia   History of Present Illness   GLYNNA Sutton is a 89 y.o. year old female with history of DM, HTN, invasive ductal carcinoma of left breast, who presented to the ED with painless rectal bleeding and constipation. Takes baby ASA daily. Reported similar episode in 2023. GI consulted for further evaluation   ED Course: Hgb 8.9  Sodium 132, BUN 24 creat 0.88 FOBT negative (rectal exam done by EDP)  Consult: States she had been feeling constipated, she took some miralax  around 1 am, around 0120 she got the sensation to have a BM and states she did not make it to the restroom, she wears a depends and when she got to the restroom, she noticed her brief was full of blood (burgundy in color) with pieces that appeared like flesh. She notes that she had a few further episodes where she passed just blood. She does report a BM later in the morning with stool and no signs of blood around 9am. She denies any pain in her abdomen. No nausea or vomiting. She reports she did have a BM yesterday, but stools were very hard, she does take a stool softener on a regular basis. She takes baby ASA daily, not other ACs. Denies any other NSAIDs. No new medications recently other than lisinopril  which was started yesterday. She does report she got a flu shot yesterday, she also reports she had a flu shot prior to her rectal bleeding in 2023 as well. She reports her appetite is very good, though she has had a slight decline in her weight from 143lbs in feb 2024 to 133 lbs today.    Notably, had recent MR abdomen w wo contrast 01/2024:  Examination is generally somewhat limited by breath  motion artifact throughout. 2. Multiple unchanged fluid signal cysts scattered throughout the pancreas, largest in the superior pancreatic body measuring 2.0 x 1.3 cm. Pancreatic duct is mildly prominent measuring up to 0.3 cm along its length. No solid component or suspicious contrast enhancement. 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 Given very advanced age and established imaging stability, no further follow-up or characterization is required. 3. Unchanged intra and extrahepatic biliary ductal dilatation, central common bile duct measuring up to 0.8 cm. No gallstones. No obstructing mass or stone identified. 4. Cardiomegaly.  Last Colonoscopy:2023- Diverticulosis in the entire examined colon.                            There was no evidence of diverticular bleeding.                           - The distal rectum and anal verge are normal on                            retroflexion view.                           - No specimens collected. Last EGD: 2023 - 1 cm hiatal hernia.                           -  Gastroesophageal flap valve classified as Hill                            Grade III (minimal fold, loose to endoscope, hiatal                            hernia likely).                           - Normal stomach.                           - Non-bleeding duodenal diverticulum.                           - No specimens collected.    Past Medical History:  Diagnosis Date   Breast disorder    Diabetes mellitus (HCC)    Family history of breast cancer    Family history of colon cancer    Family history of lung cancer    High blood cholesterol level    HX: breast cancer    left   Hypertension    Invasive ductal carcinoma of left breast (HCC) 01/27/2014   ER +    Past Surgical History:  Procedure Laterality Date   BACK SURGERY     lower   CATARACT EXTRACTION, BILATERAL  11/2014   COLONOSCOPY WITH PROPOFOL  N/A 01/18/2022   Procedure: COLONOSCOPY WITH PROPOFOL ;   Surgeon: Eartha Angelia Sieving, MD;  Location: [facility];  Service: Gastroenterology;  Laterality: N/A;   ESOPHAGOGASTRODUODENOSCOPY (EGD) WITH PROPOFOL  N/A 01/18/2022   Procedure: ESOPHAGOGASTRODUODENOSCOPY (EGD) WITH PROPOFOL ;  Surgeon: Eartha Angelia Sieving, MD;  Location: [facility];  Service: Gastroenterology;  Laterality: N/A;   MASTECTOMY Left    SENTINEL NODE BIOPSY Right 05/15/2020   Procedure: SENTINEL NODE BIOPSY;  Surgeon: Kallie Manuelita BROCKS, MD;  Location: [facility];  Service: General;  Laterality: Right;   TOTAL MASTECTOMY Right 05/15/2020   Procedure: TOTAL MASTECTOMY RIGHT BREAST;  Surgeon: Kallie Manuelita BROCKS, MD;  Location: [facility];  Service: General;  Laterality: Right;    Prior to Admission medications   Medication Sig Start Date End Date Taking? Authorizing Provider  acetaminophen  (TYLENOL ) 500 MG tablet Take 500 mg by mouth every 6 (six) hours as needed for moderate pain or headache.    [provider]  amLODipine  (NORVASC ) 5 MG tablet Take 5 mg by mouth at bedtime.     [provider]  aspirin 81 MG chewable tablet Chew 81 mg by mouth at bedtime.    [provider]  diphenhydramine -acetaminophen  (TYLENOL  PM) 25-500 MG TABS Take 1 tablet by mouth at bedtime.     [provider]  gabapentin  (NEURONTIN ) 300 MG capsule Take 300 mg by mouth 3 (three) times daily as needed (pain). 09/30/19   [provider]  metFORMIN (GLUCOPHAGE-XR) 500 MG 24 hr tablet Take 500 mg by mouth 2 (two) times daily.  12/12/16   [provider]  mirabegron  ER (MYRBETRIQ ) 25 MG TB24 tablet Take 25 mg by mouth daily. 12/28/23   [provider]  Multiple Vitamins-Minerals (CENTRUM SILVER PO) Take 1 tablet by mouth daily.    [provider]  [facility] VERIO test strip 1 each daily. 07/23/23   [provider]  oxybutynin  (DITROPAN ) 5 MG  tablet Take 5 mg by mouth 2 (two) times daily. 07/04/23   [provider]   pantoprazole  (PROTONIX ) 40 MG tablet Take 40 mg by mouth every morning. 06/19/23   [provider]  Polyethyl Glycol-Propyl Glycol (SYSTANE OP) Place 1 drop into both eyes daily as needed (dry eyes).    [provider]  polyethylene glycol (MIRALAX  / GLYCOLAX ) packet Take 17 g by mouth at bedtime.     [provider]  predniSONE  (STERAPRED UNI-PAK 21 TAB) 5 MG (21) TBPK tablet Take 6,5,4,3,2,1 one tablet less each day, take with food 09/08/22   Barbarann Oneil BROCKS, MD    Current Facility-Administered Medications  Medication Dose Route Frequency Provider Last Rate Last Admin   pantoprazole  (PROTONIX ) injection 40 mg  40 mg Intravenous Once Butler, Michael C, MD       sodium chloride  0.9 % bolus 500 mL  500 mL Intravenous Once Butler, Michael C, MD       Current Outpatient Medications  Medication Sig Dispense Refill   acetaminophen  (TYLENOL ) 500 MG tablet Take 500 mg by mouth every 6 (six) hours as needed for moderate pain or headache.     amLODipine  (NORVASC ) 5 MG tablet Take 5 mg by mouth at bedtime.      aspirin 81 MG chewable tablet Chew 81 mg by mouth at bedtime.     diphenhydramine -acetaminophen  (TYLENOL  PM) 25-500 MG TABS Take 1 tablet by mouth at bedtime.      gabapentin  (NEURONTIN ) 300 MG capsule Take 300 mg by mouth 3 (three) times daily as needed (pain).     metFORMIN (GLUCOPHAGE-XR) 500 MG 24 hr tablet Take 500 mg by mouth 2 (two) times daily.      mirabegron  ER (MYRBETRIQ ) 25 MG TB24 tablet Take 25 mg by mouth daily.     Multiple Vitamins-Minerals (CENTRUM SILVER PO) Take 1 tablet by mouth daily.     ONETOUCH VERIO test strip 1 each daily.     oxybutynin  (DITROPAN ) 5 MG tablet Take 5 mg by mouth 2 (two) times daily.     pantoprazole  (PROTONIX ) 40 MG tablet Take 40 mg by mouth every morning.     Polyethyl Glycol-Propyl Glycol (SYSTANE OP) Place 1 drop into both eyes daily as needed (dry eyes).     polyethylene glycol (MIRALAX  / GLYCOLAX ) packet Take 17 g by  mouth at bedtime.      predniSONE  (STERAPRED UNI-PAK 21 TAB) 5 MG (21) TBPK tablet Take 6,5,4,3,2,1 one tablet less each day, take with food 21 tablet 0    Allergies as of 03/06/2024 - Review Complete 03/06/2024  Allergen Reaction Noted   Codeine Nausea And Vomiting 03/16/2011    Family History  Problem Relation Age of Onset   Cancer Mother    Breast cancer Mother    Colon cancer Mother    Cancer Sister    Breast cancer Sister    Cancer Sister    Lung cancer Father     Social History   Socioeconomic History   Marital status: Widowed    Spouse name: Not on file   Number of children: Not on file   Years of education: Not on file   Highest education level: Not on file  Occupational History   Not on file  Tobacco Use   Smoking status: Never   Smokeless tobacco: Never  Vaping Use   Vaping status: Never Used  Substance and Sexual Activity   Alcohol  use: No   Drug use: No  Sexual activity: Not Currently    Birth control/protection: Surgical    Comment: hyst  Other Topics Concern   Not on file  Social History Narrative   Not on file   Social Drivers of Health   Financial Resource Strain: Low Risk  (10/18/2019)   Overall Financial Resource Strain (CARDIA)    Difficulty of Paying Living Expenses: Not hard at all  Food Insecurity: No Food Insecurity (10/18/2019)   Hunger Vital Sign    Worried About Running Out of Food in the Last Year: Never true    Ran Out of Food in the Last Year: Never true  Transportation Needs: No Transportation Needs (10/18/2019)   PRAPARE - Administrator, Civil Service (Medical): No    Lack of Transportation (Non-Medical): No  Physical Activity: Insufficiently Active (10/18/2019)   Exercise Vital Sign    Days of Exercise per Week: 3 days    Minutes of Exercise per Session: 20 min  Stress: No Stress Concern Present (10/18/2019)   [employer] of Occupational Health - Occupational Stress Questionnaire    Feeling of Stress : Only a  little  Social Connections: Moderately Isolated (10/18/2019)   Social Connection and Isolation Panel    Frequency of Communication with Friends and Family: Once a week    Frequency of Social Gatherings with Friends and Family: Once a week    Attends Religious Services: More than 4 times per year    Active Member of [employer] or Organizations: Yes    Attends Banker Meetings: More than 4 times per year    Marital Status: Widowed  Intimate Partner Violence: Not At Risk (10/18/2019)   Humiliation, Afraid, Rape, and Kick questionnaire    Fear of Current or Ex-Partner: No    Emotionally Abused: No    Physically Abused: No    Sexually Abused: No     Review of Systems   Gen: Denies any fever, chills, loss of appetite, change in weight +decline in weight CV: Denies chest pain, heart palpitations, syncope, edema  Resp: Denies shortness of breath with rest, cough, wheezing, coughing up blood, and pleurisy. GI:  denies melena, nausea, vomiting, diarrhea, constipation, dysphagia, odyonophagia, early satiety or weight loss. +maroon colored rectal bleeding  GU : Denies urinary burning, blood in urine, urinary frequency, and urinary incontinence. MS: Denies joint pain, limitation of movement, swelling, cramps, and atrophy.  Derm: Denies rash, itching, dry skin, hives. Psych: Denies depression, anxiety, memory loss, hallucinations, and confusion. Heme: Denies bruising or bleeding Neuro:  Denies any headaches, dizziness, paresthesias, shaking  Physical Exam   Vital Signs in last 24 hours: Temp:  [98.1 F (36.7 C)] 98.1 F (36.7 C) (09/24 1108) Pulse Rate:  [94] 94 (09/24 1108) Resp:  [16] 16 (09/24 1108) BP: (124)/(77) 124/77 (09/24 1108) SpO2:  [94 %] 94 % (09/24 1108) Weight:  [60.6 kg] 60.6 kg (09/24 1106)   General:   Alert,  Well-developed, well-nourished, pleasant and cooperative in NAD Head:  Normocephalic and atraumatic. Eyes:  Sclera clear, no icterus.   Conjunctiva  pink. Ears:  Normal auditory acuity. Mouth:  No deformity or lesions, dentition normal. Neck:  Supple; no masses Lungs:  Clear throughout to auscultation.   No wheezes, crackles, or rhonchi. No acute distress. Heart:  Regular rate and rhythm; no murmurs, clicks, rubs,  or gallops. Abdomen:  Soft, nontender and nondistended. No masses, hepatosplenomegaly or hernias noted. Normal bowel sounds, without guarding, and without rebound.   Msk:  Symmetrical without  gross deformities. Normal posture. Extremities:  Without clubbing or edema. Neurologic:  Alert and  oriented x4. Skin:  Intact without significant lesions or rashes. Psych:  Alert and cooperative. Normal mood and affect.   Labs/Studies   Recent Labs Recent Labs    03/06/24 1122  WBC 7.6  HGB 8.9*  HCT 29.7*  PLT 368   BMET Recent Labs    03/06/24 1122  NA 132*  K 4.5  CL 99  CO2 21*  GLUCOSE 140*  BUN 24*  CREATININE 0.88  CALCIUM 9.7   LFT Recent Labs    03/06/24 1122  PROT 6.4*  ALBUMIN 3.8  AST 16  ALT 15  ALKPHOS 73  BILITOT 0.6     Assessment   KAMEKA WHAN is a 89 y.o. year old female with history of DM, HTN, invasive ductal carcinoma of left breast, who presented to the ED with painless rectal bleeding and constipation. Takes baby ASA daily. Reported similar episode in 2023. GI consulted for further evaluation   Rectal bleeding/anemia: -hgb 8.8 on admission, 11.4 on 9/16 ([facility] tab) -noted heavy volume rectal bleeding earlier this morning that has subsided -FOBT in the ED was negative. -no associated abdominal pain, nausea, vomiting, diarrhea -similar presentation in 2023 when she underwent EGD/Colonoscopy with findings as above  Overall, suspect we may be dealing with a diverticular bleed, reassuringly bleeding seems to have subsided, she has had no further bleeding over the course of 10-12 hours, and hemoccult was negative. Her BUN is elevated though very low suspicion for Rapid transit  UGIB in setting of hemodynamic instability, cessation of bleeding and and negative occult stool testing. Fairly recent TCS in 2023 with diverticulosis. For now, would recommend serial h&h, monitor for further overt rectal bleeding. If she were to continue to have a downtrend in hgb or further bleeding, would need to consider inpatient colonoscopy, otherwise, will plan to pursue colonoscopy as outpatient.    Plan / Recommendations   -PPI daily -serial h&h trending -Monitor for further rectal bleeding -consider inpatient colonoscopy pending clinical course, if overt GI bleeding or significant downtrend in hgb -would hold ASA for atleast the next 24 hours in light of recent bleeding  -clear liquid diet today    03/06/2024, 12:28 PM   Tyrell Brereton L. Niraj Kudrna, MSN, APRN, AGNP-C Adult-Gerontology Nurse Practitioner [facility] at [facility]

## 2024-03-06 NOTE — ED Notes (Signed)
 Assisted pt to BR and back to room. Pt ambulated with one assist without difficultly.

## 2024-03-06 NOTE — ED Triage Notes (Signed)
 Pt arrived via POV c/o heavy amount of rectal bleeding. Pt reports taking a laxative around 0100 this morning, and at 0300 she began passing large amounts of blood in her stool. Pt endorses feeling weak now.

## 2024-03-06 NOTE — ED Provider Notes (Signed)
 [place] EMERGENCY DEPARTMENT AT [facility] Provider Note   CSN: [id number] Arrival date & time: 03/06/24  1059     Patient presents with: Weakness   Christine Sutton is a 89 y.o. female.  She is brought in by family for evaluation of painless rectal bleeding.  She said she was constipated and took a laxative last night.  Around 1 AM woke up with a feeling of needing to move her bowels.  Had explosive dark red blood.  Lasted till around 3 AM.  Associated with dizziness lightheadedness.  She also was weak and slipped to the floor and could not get up.  Has really not had any bleeding since then.  Not associated with any abdominal pain.  No chest pain or shortness of breath.  He is on a baby aspirin.  She said she had a similar episode a few years ago and they did a colonoscopy and could not find anything.   The history is provided by the patient and a relative.  Rectal Bleeding Quality:  Maroon Amount:  Copious Duration:  2 hours Timing:  Constant Chronicity:  Recurrent Context: spontaneously   Similar prior episodes: yes   Relieved by:  None tried Associated symptoms: dizziness and light-headedness   Associated symptoms: no abdominal pain, no fever, no hematemesis, no loss of consciousness, no recent illness and no vomiting        Prior to Admission medications   Medication Sig Start Date End Date Taking? Authorizing Provider  acetaminophen  (TYLENOL ) 500 MG tablet Take 500 mg by mouth every 6 (six) hours as needed for moderate pain or headache.    [provider]  amLODipine  (NORVASC ) 5 MG tablet Take 5 mg by mouth at bedtime.     [provider]  aspirin 81 MG chewable tablet Chew 81 mg by mouth at bedtime.    [provider]  diphenhydramine -acetaminophen  (TYLENOL  PM) 25-500 MG TABS Take 1 tablet by mouth at bedtime.     [provider]  gabapentin  (NEURONTIN ) 300 MG capsule Take 300 mg by mouth 3 (three) times daily as needed  (pain). 09/30/19   [provider]  metFORMIN (GLUCOPHAGE-XR) 500 MG 24 hr tablet Take 500 mg by mouth 2 (two) times daily.  12/12/16   [provider]  mirabegron  ER (MYRBETRIQ ) 25 MG TB24 tablet Take 25 mg by mouth daily. 12/28/23   [provider]  Multiple Vitamins-Minerals (CENTRUM SILVER PO) Take 1 tablet by mouth daily.    [provider]  [facility] VERIO test strip 1 each daily. 07/23/23   [provider]  oxybutynin  (DITROPAN ) 5 MG tablet Take 5 mg by mouth 2 (two) times daily. 07/04/23   [provider]  pantoprazole  (PROTONIX ) 40 MG tablet Take 40 mg by mouth every morning. 06/19/23   [provider]  Polyethyl Glycol-Propyl Glycol (SYSTANE OP) Place 1 drop into both eyes daily as needed (dry eyes).    [provider]  polyethylene glycol (MIRALAX  / GLYCOLAX ) packet Take 17 g by mouth at bedtime.     [provider]  predniSONE  (STERAPRED UNI-PAK 21 TAB) 5 MG (21) TBPK tablet Take 6,5,4,3,2,1 one tablet less each day, take with food 09/08/22   Barbarann Oneil BROCKS, MD    Allergies: Codeine    Review of Systems  Constitutional:  Negative for fever.  Gastrointestinal:  Positive for hematochezia. Negative for abdominal pain, hematemesis and vomiting.  Neurological:  Positive for dizziness and light-headedness. Negative for loss  of consciousness.    Updated Vital Signs BP 124/77 (BP Location: Right Arm)   Pulse 94   Temp 98.1 F (36.7 C) (Oral)   Resp 16   Ht 5' 5 (1.651 m)   Wt 60.6 kg   SpO2 94%   BMI 22.23 kg/m   Physical Exam Vitals and nursing note reviewed.  Constitutional:      General: She is not in acute distress.    Appearance: Normal appearance. She is well-developed.  HENT:     Head: Normocephalic and atraumatic.  Eyes:     Conjunctiva/sclera: Conjunctivae normal.  Cardiovascular:     Rate and Rhythm: Normal rate and regular rhythm.     Heart sounds: No murmur heard. Pulmonary:     Effort:  Pulmonary effort is normal. No respiratory distress.     Breath sounds: Normal breath sounds. No stridor. No wheezing.  Abdominal:     Palpations: Abdomen is soft.     Tenderness: There is no abdominal tenderness. There is no guarding or rebound.  Musculoskeletal:        General: No deformity.     Cervical back: Neck supple.  Skin:    General: Skin is warm and dry.  Neurological:     General: No focal deficit present.     Mental Status: She is alert.     GCS: GCS eye subscore is 4. GCS verbal subscore is 5. GCS motor subscore is 6.     (all labs ordered are listed, but only abnormal results are displayed) Labs Reviewed  CBC WITH DIFFERENTIAL/PLATELET - Abnormal; Notable for the following components:      Result Value   RBC 3.64 (*)    Hemoglobin 8.9 (*)    HCT 29.7 (*)    MCH 24.5 (*)    RDW 19.9 (*)    All other components within normal limits  COMPREHENSIVE METABOLIC PANEL WITH GFR - Abnormal; Notable for the following components:   Sodium 132 (*)    CO2 21 (*)    Glucose, Bld 140 (*)    BUN 24 (*)    Total Protein 6.4 (*)    All other components within normal limits  LIPASE, BLOOD  URINALYSIS, ROUTINE W REFLEX MICROSCOPIC  HEMOGLOBIN AND HEMATOCRIT, BLOOD  HEMOGLOBIN AND HEMATOCRIT, BLOOD  MAGNESIUM  COMPREHENSIVE METABOLIC PANEL WITH GFR  CBC  POC OCCULT BLOOD, ED  TYPE AND SCREEN    EKG: EKG Interpretation Date/Time:  Wednesday March 06 2024 12:44:38 EDT Ventricular Rate:  78 PR Interval:  165 QRS Duration:  92 QT Interval:  362 QTC Calculation: 413 R Axis:   49  Text Interpretation: Sinus rhythm No significant change since prior 12/21 Confirmed by Towana Sharper [phone]) on 03/06/2024 12:54:28 PM  Radiology: No results found.   Procedures   Medications Ordered in the ED  mirabegron  ER (MYRBETRIQ ) tablet 25 mg (25 mg Oral Given 03/06/24 1551)  gabapentin  (NEURONTIN ) capsule 300 mg (has no administration in time range)  acetaminophen  (TYLENOL )  tablet 650 mg (has no administration in time range)    Or  acetaminophen  (TYLENOL ) suppository 650 mg (has no administration in time range)  ondansetron  (ZOFRAN ) tablet 4 mg (has no administration in time range)    Or  ondansetron  (ZOFRAN ) injection 4 mg (has no administration in time range)  0.9 %  sodium chloride  infusion ( Intravenous Infusion Verify 03/06/24 1639)  pantoprazole  (PROTONIX ) EC tablet 40 mg (has no administration in time range)  pantoprazole  (PROTONIX ) injection 40 mg (  40 mg Intravenous Given 03/06/24 1245)  sodium chloride  0.9 % bolus 500 mL (0 mLs Intravenous Stopped 03/06/24 1553)    Clinical Course as of 03/06/24 1712  Wed Mar 06, 2024  1226 Discussed with Dr. Shaaron GI.  He said she does not need any imaging at this time.  Serial hemoglobins clear liquid diet PPI and GI will consult on her. [MB]  1237 Discussed with Dr. Maree [facility] who will evaluate patient for admission. [MB]    Clinical Course User Index [MB] Towana Ozell BROCKS, MD                                 Medical Decision Making Amount and/or Complexity of Data Reviewed Labs: ordered.  Risk Prescription drug management. Decision regarding hospitalization.   This patient complains of painless rectal bleeding; this involves an extensive number of treatment Options and is a complaint that carries with it a high risk of complications and morbidity. The differential includes diverticular bleed, AVM, diverticulitis, upper GI bleed  I ordered, reviewed and interpreted labs, which included CBC with normal white count, low hemoglobin, chemistries with mildly low sodium elevated BUN, fecal occult negative, type and screen sent I ordered medication IV PPI and fluids and reviewed PMP when indicated. Additional history obtained from patient's son Previous records obtained and reviewed in epic including prior EGD and colonoscopy I consulted Dr. Shaaron GI and Dr. Maree [facility] and discussed lab  and imaging findings and discussed disposition.  Cardiac monitoring reviewed, sinus rhythm Social determinants considered, no significant barriers Critical Interventions: None  After the interventions stated above, I reevaluated the patient and found patient to be well-appearing in no pain Admission and further testing considered, she would benefit from mission to the hospital for serial hemoglobins and expectant management.  She is comfortable plan for admission.      Final diagnoses:  Acute lower GI bleeding    ED Discharge Orders     None          Towana Ozell BROCKS, MD 03/06/24 1715

## 2024-03-06 NOTE — ED Notes (Signed)
 Pt to er room number 18 via wc, son at bedside, MD at bedside eval pt.

## 2024-03-06 NOTE — Plan of Care (Signed)

## 2024-03-07 DIAGNOSIS — K922 Gastrointestinal hemorrhage, unspecified: Secondary | ICD-10-CM | POA: Diagnosis not present

## 2024-03-07 DIAGNOSIS — K625 Hemorrhage of anus and rectum: Secondary | ICD-10-CM | POA: Diagnosis not present

## 2024-03-07 LAB — COMPREHENSIVE METABOLIC PANEL WITH GFR
ALT: 14 U/L (ref 0–44)
AST: 15 U/L (ref 15–41)
Albumin: 3.2 g/dL — ABNORMAL LOW (ref 3.5–5.0)
Alkaline Phosphatase: 63 U/L (ref 38–126)
Anion gap: 7 (ref 5–15)
BUN: 13 mg/dL (ref 8–23)
CO2: 24 mmol/L (ref 22–32)
Calcium: 8.9 mg/dL (ref 8.9–10.3)
Chloride: 105 mmol/L (ref 98–111)
Creatinine, Ser: 0.63 mg/dL (ref 0.44–1.00)
GFR, Estimated: >60 mL/min (ref >60)
Glucose, Bld: 104 mg/dL — ABNORMAL HIGH (ref 70–99)
Potassium: 4.1 mmol/L (ref 3.5–5.1)
Sodium: 136 mmol/L (ref 135–145)
Total Bilirubin: 0.5 mg/dL (ref 0.0–1.2)
Total Protein: 5.5 g/dL — ABNORMAL LOW (ref 6.5–8.1)

## 2024-03-07 LAB — CBC
HCT: 25.4 % — ABNORMAL LOW (ref 36.0–46.0)
Hemoglobin: 7.8 g/dL — ABNORMAL LOW (ref 12.0–15.0)
MCH: 24.9 pg — ABNORMAL LOW (ref 26.0–34.0)
MCHC: 30.7 g/dL (ref 30.0–36.0)
MCV: 81.2 fL (ref 80.0–100.0)
Platelets: 307 K/uL (ref 150–400)
RBC: 3.13 MIL/uL — ABNORMAL LOW (ref 3.87–5.11)
RDW: 20.3 % — ABNORMAL HIGH (ref 11.5–15.5)
WBC: 5.2 K/uL (ref 4.0–10.5)
nRBC: 0 % (ref 0.0–0.2)

## 2024-03-07 LAB — HEMOGLOBIN AND HEMATOCRIT, BLOOD
HCT: 27.3 % — ABNORMAL LOW (ref 36.0–46.0)
Hemoglobin: 8.2 g/dL — ABNORMAL LOW (ref 12.0–15.0)

## 2024-03-07 LAB — MAGNESIUM: Magnesium: 1.7 mg/dL (ref 1.7–2.4)

## 2024-03-07 MED ORDER — FERROUS SULFATE 325 (65 FE) MG PO TBEC: 325.0000 mg | DELAYED_RELEASE_TABLET | Freq: Two times a day (BID) | ORAL | 3 refills | Status: AC

## 2024-03-07 MED ORDER — SODIUM CHLORIDE 0.9 % IV SOLN
200.0000 mg | Freq: Once | INTRAVENOUS | Status: AC
  Administered 2024-03-07: 200 mg via INTRAVENOUS
  Filled 2024-03-07: qty 10

## 2024-03-07 MED ORDER — DOCUSATE SODIUM 100 MG PO CAPS: 100.0000 mg | ORAL_CAPSULE | Freq: Two times a day (BID) | ORAL | 2 refills | Status: AC

## 2024-03-07 NOTE — Discharge Summary (Signed)
 Physician Discharge Summary  NDIA SAMPATH FMW:[id number] DOB: 21-Apr-1930 DOA: 03/06/2024  PCP: Sheryle Carwin, MD  Admit date: 03/06/2024  Discharge date: 03/07/2024  Admitted From:Home  Disposition:  Home  Recommendations for Outpatient Follow-up:  Follow up with PCP in 1-2 weeks Please obtain BMP/CBC in one week Remain on iron  supplementation as prescribed with Colace Continue other home medications as prior Discontinue further aspirin use  Home Health: None  Equipment/Devices: None  Discharge Condition:Stable  CODE STATUS: DNR  Diet recommendation: Heart Healthy  Brief/Interim Summary: Christine Sutton is a 89 y.o. female with medical history significant for hypertension, diabetes, breast cancer, iron  deficiency anemia, and diverticulosis who presented to the ED with concerns for painless rectal bleeding and some constipation.  She had a similar episode in 2023 and underwent upper and lower endoscopy with noted diverticulosis at that time.  She was admitted for acute blood loss anemia evaluation and appears to now have stable hemoglobin levels with no further overt bleeding noted while hospitalized.  She was seen by GI with recommendations to not undergo any further intervention at this time.  She has been recommended to stop aspirin use at home and will be given IV iron  supplementation prior to discharge as well as oral supplementation to use at home along with stool softener.  No other acute events or concerns noted.  Discharge Diagnoses:  Principal Problem:   Acute GI bleeding Active Problems:   Diabetes mellitus due to underlying condition without complications (HCC)   Hypertension, essential, benign   Breast cancer (HCC)   Blood loss anemia  Principal discharge diagnosis: Acute blood loss anemia suspected to be related to diverticular bleed-stable and resolved.  Discharge Instructions  Discharge Instructions     Diet - low sodium heart healthy   Complete by: As  directed    Increase activity slowly   Complete by: As directed       Allergies as of 03/07/2024       Reactions   Codeine Nausea And Vomiting   Ivp Dye [iodinated Contrast Media]         Medication List     STOP taking these medications    aspirin 81 MG chewable tablet       TAKE these medications    acetaminophen  500 MG tablet Commonly known as: TYLENOL  Take 500 mg by mouth every 6 (six) hours as needed for moderate pain or headache.   amLODipine  5 MG tablet Commonly known as: NORVASC  Take 5 mg by mouth every evening.   CENTRUM SILVER PO Take 1 tablet by mouth daily.   diphenhydramine -acetaminophen  25-500 MG Tabs tablet Commonly known as: TYLENOL  PM Take 1 tablet by mouth at bedtime.   docusate sodium  100 MG capsule Commonly known as: Colace Take 1 capsule (100 mg total) by mouth 2 (two) times daily. What changed: when to take this   ferrous sulfate  325 (65 FE) MG EC tablet Take 1 tablet (325 mg total) by mouth 2 (two) times daily.   gabapentin  300 MG capsule Commonly known as: NEURONTIN  Take 300 mg by mouth 3 (three) times daily as needed (pain).   lisinopril  10 MG tablet Commonly known as: ZESTRIL  Take 10 mg by mouth daily.   metFORMIN 500 MG 24 hr tablet Commonly known as: GLUCOPHAGE-XR Take 500 mg by mouth 2 (two) times daily.   mirabegron  ER 25 MG Tb24 tablet Commonly known as: MYRBETRIQ  Take 25 mg by mouth daily.   OneTouch Verio test strip Generic drug: glucose blood  1 each daily.   oxybutynin  5 MG tablet Commonly known as: DITROPAN  Take 5 mg by mouth 2 (two) times daily.   pantoprazole  40 MG tablet Commonly known as: PROTONIX  Take 40 mg by mouth every morning.   polyethylene glycol 17 g packet Commonly known as: MIRALAX  / GLYCOLAX  Take 17 g by mouth at bedtime.   SYSTANE OP Place 1 drop into both eyes daily as needed (dry eyes).        Follow-up Information     Sheryle Carwin, MD. Schedule an appointment as soon as  possible for a visit in 1 week(s).   Specialty: Internal Medicine Contact information: [address] [place] [address] [phone]                Allergies  Allergen Reactions   Codeine Nausea And Vomiting   Ivp Dye [Iodinated Contrast Media]     Consultations: GI   Procedures/Studies: No results found.   Discharge Exam: Vitals:   03/07/24 0438 03/07/24 0900  BP: (!) 149/82 (!) 148/69  Pulse: 81 78  Resp: 18 18  Temp: 97.9 F (36.6 C) 98.2 F (36.8 C)  SpO2:  98%   Vitals:   03/06/24 1706 03/06/24 2048 03/07/24 0438 03/07/24 0900  BP: (!) 159/94 139/63 (!) 149/82 (!) 148/69  Pulse: (!) 103 80 81 78  Resp: 19 16 18 18   Temp: 98.5 F (36.9 C) 98.4 F (36.9 C) 97.9 F (36.6 C) 98.2 F (36.8 C)  TempSrc: Oral Oral Oral Oral  SpO2: 99% 93%  98%  Weight: 58.8 kg     Height: 5' 4 (1.626 m)       General: Pt is alert, awake, not in acute distress Cardiovascular: RRR, S1/S2 +, no rubs, no gallops Respiratory: CTA bilaterally, no wheezing, no rhonchi Abdominal: Soft, NT, ND, bowel sounds + Extremities: no edema, no cyanosis    The results of significant diagnostics from this hospitalization (including imaging, microbiology, ancillary and laboratory) are listed below for reference.     Microbiology: No results found for this or any previous visit (from the past 240 hours).   Labs: BNP (last 3 results) No results for input(s): BNP in the last 8760 hours. Basic Metabolic Panel: Recent Labs  Lab 03/06/24 1122 03/07/24 0439  NA 132* 136  K 4.5 4.1  CL 99 105  CO2 21* 24  GLUCOSE 140* 104*  BUN 24* 13  CREATININE 0.88 0.63  CALCIUM 9.7 8.9  MG  --  1.7   Liver Function Tests: Recent Labs  Lab 03/06/24 1122 03/07/24 0439  AST 16 15  ALT 15 14  ALKPHOS 73 63  BILITOT 0.6 0.5  PROT 6.4* 5.5*  ALBUMIN 3.8 3.2*   Recent Labs  Lab 03/06/24 1122  LIPASE 37   No results for input(s): AMMONIA in the last 168  hours. CBC: Recent Labs  Lab 03/06/24 1122 03/06/24 1824 03/06/24 2308 03/07/24 0439 03/07/24 1111  WBC 7.6  --   --  5.2  --   NEUTROABS 6.0  --   --   --   --   HGB 8.9* 8.0* 7.8* 7.8* 8.2*  HCT 29.7* 28.3* 25.3* 25.4* 27.3*  MCV 81.6  --   --  81.2  --   PLT 368  --   --  307  --    Cardiac Enzymes: No results for input(s): CKTOTAL, CKMB, CKMBINDEX, TROPONINI in the last 168 hours. BNP: Invalid input(s): POCBNP CBG: No results for input(s): GLUCAP  in the last 168 hours. D-Dimer No results for input(s): DDIMER in the last 72 hours. Hgb A1c No results for input(s): HGBA1C in the last 72 hours. Lipid Profile No results for input(s): CHOL, HDL, LDLCALC, TRIG, CHOLHDL, LDLDIRECT in the last 72 hours. Thyroid  function studies No results for input(s): TSH, T4TOTAL, T3FREE, THYROIDAB in the last 72 hours.  Invalid input(s): FREET3 Anemia work up No results for input(s): VITAMINB12, FOLATE, FERRITIN, TIBC, IRON , RETICCTPCT in the last 72 hours. Urinalysis No results found for: COLORURINE, APPEARANCEUR, LABSPEC, PHURINE, GLUCOSEU, HGBUR, BILIRUBINUR, KETONESUR, PROTEINUR, UROBILINOGEN, NITRITE, LEUKOCYTESUR Sepsis Labs Recent Labs  Lab 03/06/24 1122 03/07/24 0439  WBC 7.6 5.2   Microbiology No results found for this or any previous visit (from the past 240 hours).   Time coordinating discharge: 35 minutes  SIGNED:   Adron JONETTA Fairly, DO [facility] 03/07/2024, 11:59 AM  If 7PM-7AM, please contact night-coverage [email or web address]

## 2024-03-07 NOTE — TOC CM/SW Note (Signed)
 Transition of Care [facility]) - Inpatient Brief Assessment   Patient Details  Name: Christine Sutton MRN: [id number] Date of Birth: 1929-06-18  [facility]) CM/SW Contact:    Noreen KATHEE Cleotilde ISRAEL Phone Number: 03/07/2024, 9:48 AM   Clinical Narrative:   Inpatient Care Management (ICM) has reviewed patient and no ICM needs have been identified at this time. We will continue to monitor patient advancement through interdisciplinary progression rounds. If new patient transition needs arise, please place a ICM consult.  Transition of Care Asessment: Insurance and Status: Insurance coverage has been reviewed Patient has primary care physician: Yes Home environment has been reviewed: Single Family Home Prior level of function:: Independent Prior/Current Home Services: No current home services Social Drivers of Health Review: SDOH reviewed no interventions necessary Readmission risk has been reviewed: Yes Transition of care needs: no transition of care needs at this time

## 2024-03-07 NOTE — Care Management Obs Status (Signed)
 MEDICARE OBSERVATION STATUS NOTIFICATION   Patient Details  Name: Christine Sutton MRN: [id number] Date of Birth: [birth year]   Medicare Observation Status Notification Given:  Yes    Noreen KATHEE Pinal, LCSWA 03/07/2024, 11:15 AM

## 2024-03-07 NOTE — Progress Notes (Signed)
 Gastroenterology Progress Note   Referring Provider: No ref. provider found Primary Care Physician:  Sheryle Carwin, MD Primary Gastroenterologist:  Carlin POUR. Cindie, DO   Patient ID: Christine Sutton; [id number]; [birth year]   Subjective:    Feels better. Wants to go home. No BM since presentation. No bleeding in nearly 24 hours.    Objective:   Vital signs in last 24 hours: Temp:  [97.9 F (36.6 C)-98.5 F (36.9 C)] 98.2 F (36.8 C) (09/25 0900) Pulse Rate:  [77-103] 78 (09/25 0900) Resp:  [12-21] 18 (09/25 0900) BP: (124-170)/(63-94) 148/69 (09/25 0900) SpO2:  [93 %-100 %] 98 % (09/25 0900) Weight:  [58.8 kg-60.6 kg] 58.8 kg (09/24 1706) Last BM Date : 03/06/24 General:   Alert,  Well-developed, well-nourished, pleasant and cooperative in NAD Head:  Normocephalic and atraumatic. Eyes:  Sclera clear, no icterus.   Abdomen:  Soft, nontender and nondistended. No masses, hepatosplenomegaly or hernias noted. Normal bowel sounds, without guarding, and without rebound.   Extremities:  Without clubbing, deformity or edema. Neurologic:  Alert and  oriented x4;  grossly normal neurologically. Skin:  Intact without significant lesions or rashes. Psych:  Alert and cooperative. Normal mood and affect.  Intake/Output from previous day: 09/24 0701 - 09/25 0700 In: 556.9 [I.V.:56.9; IV Piggyback:500] Out: -  Intake/Output this shift: No intake/output data recorded.  Lab Results: CBC Recent Labs    03/06/24 1122 03/06/24 1824 03/06/24 2308 03/07/24 0439  WBC 7.6  --   --  5.2  HGB 8.9* 8.0* 7.8* 7.8*  HCT 29.7* 28.3* 25.3* 25.4*  MCV 81.6  --   --  81.2  PLT 368  --   --  307   BMET Recent Labs    03/06/24 1122 03/07/24 0439  NA 132* 136  K 4.5 4.1  CL 99 105  CO2 21* 24  GLUCOSE 140* 104*  BUN 24* 13  CREATININE 0.88 0.63  CALCIUM 9.7 8.9   LFTs Recent Labs    03/06/24 1122 03/07/24 0439  BILITOT 0.6 0.5  ALKPHOS 73 63  AST 16 15  ALT 15 14  PROT 6.4*  5.5*  ALBUMIN 3.8 3.2*   Recent Labs    03/06/24 1122  LIPASE 37   PT/INR No results for input(s): LABPROT, INR in the last 72 hours.       Imaging Studies: No results found.[2 weeks]  Assessment:   Christine Sutton is a 89 y.o. year old female with history of DM, HTN, invasive ductal carcinoma of left breast, who presented to the ED with painless rectal bleeding and constipation. Takes baby ASA daily. Reported similar episode in 2023. GI consulted for further evaluation    Rectal bleeding/anemia: -hgb 8.8 on admission, 11.4 on 9/16 ([facility] tab) -Hgb 7.8 this morning and on repeat was 8.2 -noted heavy volume rectal bleeding prior to presentation that subsided prior to coming in for evaluation -FOBT in the ED was negative. -no associated abdominal pain, nausea, vomiting, diarrhea -similar presentation in 2023 when she underwent EGD/Colonoscopy   Overall, suspect we may be dealing with a diverticular bleed, reassuringly bleeding seems to have subsided, she has had no further bleeding over the course of 24 hours, and hemoccult was negative. Fairly recent TCS in 2023 with diverticulosis presented similarly at that time. For now, would recommend serial h&h, monitor for further overt rectal bleeding. If she were to continue to have a downtrend in hgb or further bleeding, would need to consider inpatient colonoscopy, otherwise, will  plan to pursue colonoscopy as outpatient.      Plan:   Patient desires discharge, given no overt bleeding in the last 24 hours and fairly stable Hgb, appropriate for discharge.  She will monitor for recurrent bleeding, returned precautions explained.  She will received dose of IV iron  prior to discharge per attending.    LOS: 0 days   Sonny RAMAN. Ezzard RIGGERS [facility] [phone] 9/25/202510:22 AM

## 2024-03-13 DIAGNOSIS — Z79899 Other long term (current) drug therapy: Secondary | ICD-10-CM | POA: Diagnosis not present

## 2024-03-13 DIAGNOSIS — K922 Gastrointestinal hemorrhage, unspecified: Secondary | ICD-10-CM | POA: Diagnosis not present

## 2024-03-15 DIAGNOSIS — I10 Essential (primary) hypertension: Secondary | ICD-10-CM | POA: Diagnosis not present

## 2024-03-15 DIAGNOSIS — D62 Acute posthemorrhagic anemia: Secondary | ICD-10-CM | POA: Diagnosis not present

## 2024-03-15 DIAGNOSIS — K922 Gastrointestinal hemorrhage, unspecified: Secondary | ICD-10-CM | POA: Diagnosis not present

## 2024-04-09 DIAGNOSIS — D500 Iron deficiency anemia secondary to blood loss (chronic): Secondary | ICD-10-CM | POA: Diagnosis not present

## 2024-04-09 DIAGNOSIS — I10 Essential (primary) hypertension: Secondary | ICD-10-CM | POA: Diagnosis not present

## 2024-04-09 DIAGNOSIS — K625 Hemorrhage of anus and rectum: Secondary | ICD-10-CM | POA: Diagnosis not present

## 2024-04-16 DIAGNOSIS — Z23 Encounter for immunization: Secondary | ICD-10-CM | POA: Diagnosis not present

## 2024-07-18 ENCOUNTER — Ambulatory Visit: Payer: Self-pay
